# Patient Record
Sex: Female | Born: 1935
Health system: Southern US, Community
[De-identification: ages and names within clinical notes are randomized; demographics above are authoritative.]

## PROBLEM LIST (undated history)

## (undated) DIAGNOSIS — E7800 Pure hypercholesterolemia, unspecified: Secondary | ICD-10-CM

## (undated) DIAGNOSIS — I10 Essential (primary) hypertension: Secondary | ICD-10-CM

## (undated) DIAGNOSIS — M1990 Unspecified osteoarthritis, unspecified site: Secondary | ICD-10-CM

## (undated) DIAGNOSIS — H25019 Cortical age-related cataract, unspecified eye: Secondary | ICD-10-CM

## (undated) DIAGNOSIS — J449 Chronic obstructive pulmonary disease, unspecified: Secondary | ICD-10-CM

## (undated) DIAGNOSIS — M109 Gout, unspecified: Secondary | ICD-10-CM

## (undated) DIAGNOSIS — N289 Disorder of kidney and ureter, unspecified: Secondary | ICD-10-CM

## (undated) DIAGNOSIS — R600 Localized edema: Secondary | ICD-10-CM

## (undated) DIAGNOSIS — R609 Edema, unspecified: Secondary | ICD-10-CM

## (undated) DIAGNOSIS — I5030 Unspecified diastolic (congestive) heart failure: Secondary | ICD-10-CM

## (undated) DIAGNOSIS — N6325 Unspecified lump in the left breast, overlapping quadrants: Secondary | ICD-10-CM

## (undated) DIAGNOSIS — I509 Heart failure, unspecified: Secondary | ICD-10-CM

## (undated) DIAGNOSIS — N6099 Unspecified benign mammary dysplasia of unspecified breast: Secondary | ICD-10-CM

## (undated) DIAGNOSIS — K5790 Diverticulosis of intestine, part unspecified, without perforation or abscess without bleeding: Secondary | ICD-10-CM

## (undated) HISTORY — DX: Diverticulosis of intestine, part unspecified, without perforation or abscess without bleeding: K57.90

## (undated) HISTORY — DX: Gout, unspecified: M10.9

## (undated) HISTORY — DX: Unspecified diastolic (congestive) heart failure: I50.30

## (undated) HISTORY — PX: TONSILLECTOMY: SUR1361

## (undated) HISTORY — DX: Unspecified benign mammary dysplasia of unspecified breast: N60.99

## (undated) HISTORY — DX: Edema, unspecified: R60.9

## (undated) HISTORY — PX: OTHER SURGICAL HISTORY: SHX169

## (undated) HISTORY — PX: TOTAL KNEE ARTHROPLASTY: SHX125

## (undated) HISTORY — DX: Localized edema: R60.0

## (undated) HISTORY — DX: Unspecified lump in the left breast, overlapping quadrants: N63.25

## (undated) HISTORY — DX: Pure hypercholesterolemia, unspecified: E78.00

## (undated) HISTORY — PX: CHOLECYSTECTOMY: SHX55

## (undated) HISTORY — PX: ABDOMINAL HYSTERECTOMY: SHX81

## (undated) HISTORY — DX: Cortical age-related cataract, unspecified eye: H25.019

## (undated) HISTORY — DX: Unspecified osteoarthritis, unspecified site: M19.90

---

## 2003-12-25 ENCOUNTER — Ambulatory Visit: Payer: Self-pay | Admitting: Family Medicine

## 2003-12-28 ENCOUNTER — Ambulatory Visit: Payer: Self-pay | Admitting: Family Medicine

## 2004-07-03 ENCOUNTER — Ambulatory Visit: Payer: Self-pay | Admitting: Family Medicine

## 2011-06-18 ENCOUNTER — Ambulatory Visit: Payer: Self-pay | Admitting: Family Medicine

## 2013-01-27 HISTORY — PX: CATARACT EXTRACTION: SUR2

## 2013-02-01 ENCOUNTER — Ambulatory Visit: Payer: Self-pay | Admitting: Family Medicine

## 2013-04-29 ENCOUNTER — Inpatient Hospital Stay: Payer: Self-pay | Admitting: Internal Medicine

## 2013-04-29 LAB — COMPREHENSIVE METABOLIC PANEL
ALBUMIN: 3.9 g/dL (ref 3.4–5.0)
Alkaline Phosphatase: 122 U/L — ABNORMAL HIGH
Anion Gap: 5 — ABNORMAL LOW (ref 7–16)
BUN: 20 mg/dL — AB (ref 7–18)
Bilirubin,Total: 0.7 mg/dL (ref 0.2–1.0)
CALCIUM: 8.5 mg/dL (ref 8.5–10.1)
CO2: 31 mmol/L (ref 21–32)
Chloride: 105 mmol/L (ref 98–107)
Creatinine: 1.54 mg/dL — ABNORMAL HIGH (ref 0.60–1.30)
EGFR (African American): 36 — ABNORMAL LOW
EGFR (Non-African Amer.): 31 — ABNORMAL LOW
Glucose: 133 mg/dL — ABNORMAL HIGH (ref 65–99)
Osmolality: 286 (ref 275–301)
POTASSIUM: 3.2 mmol/L — AB (ref 3.5–5.1)
SGOT(AST): 24 U/L (ref 15–37)
SGPT (ALT): 16 U/L (ref 12–78)
Sodium: 141 mmol/L (ref 136–145)
TOTAL PROTEIN: 7 g/dL (ref 6.4–8.2)

## 2013-04-29 LAB — CBC WITH DIFFERENTIAL/PLATELET
BASOS ABS: 0.1 10*3/uL (ref 0.0–0.1)
Basophil %: 0.8 %
Eosinophil #: 0.1 10*3/uL (ref 0.0–0.7)
Eosinophil %: 1.4 %
HCT: 37.1 % (ref 35.0–47.0)
HGB: 12.4 g/dL (ref 12.0–16.0)
LYMPHS ABS: 1.3 10*3/uL (ref 1.0–3.6)
LYMPHS PCT: 15.2 %
MCH: 28.7 pg (ref 26.0–34.0)
MCHC: 33.3 g/dL (ref 32.0–36.0)
MCV: 86 fL (ref 80–100)
MONO ABS: 0.4 x10 3/mm (ref 0.2–0.9)
MONOS PCT: 5.2 %
Neutrophil #: 6.6 10*3/uL — ABNORMAL HIGH (ref 1.4–6.5)
Neutrophil %: 77.4 %
PLATELETS: 171 10*3/uL (ref 150–440)
RBC: 4.31 10*6/uL (ref 3.80–5.20)
RDW: 14 % (ref 11.5–14.5)
WBC: 8.5 10*3/uL (ref 3.6–11.0)

## 2013-04-29 LAB — URINALYSIS, COMPLETE
Bilirubin,UR: NEGATIVE
Glucose,UR: NEGATIVE mg/dL (ref 0–75)
Ketone: NEGATIVE
Leukocyte Esterase: NEGATIVE
NITRITE: NEGATIVE
Ph: 6 (ref 4.5–8.0)
Protein: 100
RBC,UR: 1 /HPF (ref 0–5)
Specific Gravity: 1.006 (ref 1.003–1.030)
Squamous Epithelial: 1
WBC UR: 1 /HPF (ref 0–5)

## 2013-04-29 LAB — TROPONIN I

## 2013-04-29 LAB — PRO B NATRIURETIC PEPTIDE: B-Type Natriuretic Peptide: 3749 pg/mL — ABNORMAL HIGH (ref 0–450)

## 2013-04-30 LAB — CBC WITH DIFFERENTIAL/PLATELET
BASOS ABS: 0.1 10*3/uL (ref 0.0–0.1)
Basophil %: 0.7 %
EOS ABS: 0.2 10*3/uL (ref 0.0–0.7)
Eosinophil %: 2.1 %
HCT: 32 % — AB (ref 35.0–47.0)
HGB: 11.1 g/dL — ABNORMAL LOW (ref 12.0–16.0)
LYMPHS ABS: 1.7 10*3/uL (ref 1.0–3.6)
LYMPHS PCT: 22.9 %
MCH: 29.8 pg (ref 26.0–34.0)
MCHC: 34.7 g/dL (ref 32.0–36.0)
MCV: 86 fL (ref 80–100)
MONO ABS: 0.5 x10 3/mm (ref 0.2–0.9)
Monocyte %: 7.2 %
Neutrophil #: 4.9 10*3/uL (ref 1.4–6.5)
Neutrophil %: 67.1 %
Platelet: 167 10*3/uL (ref 150–440)
RBC: 3.72 10*6/uL — ABNORMAL LOW (ref 3.80–5.20)
RDW: 13.7 % (ref 11.5–14.5)
WBC: 7.3 10*3/uL (ref 3.6–11.0)

## 2013-04-30 LAB — BASIC METABOLIC PANEL
ANION GAP: 7 (ref 7–16)
BUN: 19 mg/dL — AB (ref 7–18)
CHLORIDE: 104 mmol/L (ref 98–107)
CREATININE: 1.63 mg/dL — AB (ref 0.60–1.30)
Calcium, Total: 8.1 mg/dL — ABNORMAL LOW (ref 8.5–10.1)
Co2: 32 mmol/L (ref 21–32)
EGFR (African American): 33 — ABNORMAL LOW
EGFR (Non-African Amer.): 29 — ABNORMAL LOW
Glucose: 95 mg/dL (ref 65–99)
OSMOLALITY: 287 (ref 275–301)
Potassium: 2.9 mmol/L — ABNORMAL LOW (ref 3.5–5.1)
SODIUM: 143 mmol/L (ref 136–145)

## 2013-04-30 LAB — MAGNESIUM: MAGNESIUM: 1.4 mg/dL — AB

## 2013-05-01 LAB — BASIC METABOLIC PANEL
Anion Gap: 4 — ABNORMAL LOW (ref 7–16)
BUN: 25 mg/dL — AB (ref 7–18)
CALCIUM: 8 mg/dL — AB (ref 8.5–10.1)
CREATININE: 1.9 mg/dL — AB (ref 0.60–1.30)
Chloride: 101 mmol/L (ref 98–107)
Co2: 36 mmol/L — ABNORMAL HIGH (ref 21–32)
EGFR (African American): 28 — ABNORMAL LOW
GFR CALC NON AF AMER: 24 — AB
GLUCOSE: 105 mg/dL — AB (ref 65–99)
OSMOLALITY: 286 (ref 275–301)
POTASSIUM: 3.2 mmol/L — AB (ref 3.5–5.1)
SODIUM: 141 mmol/L (ref 136–145)

## 2013-05-01 LAB — MAGNESIUM: Magnesium: 1.6 mg/dL — ABNORMAL LOW

## 2013-05-02 LAB — BASIC METABOLIC PANEL
ANION GAP: 3 — AB (ref 7–16)
BUN: 35 mg/dL — ABNORMAL HIGH (ref 7–18)
CALCIUM: 7.8 mg/dL — AB (ref 8.5–10.1)
CO2: 36 mmol/L — AB (ref 21–32)
Chloride: 99 mmol/L (ref 98–107)
Creatinine: 2.54 mg/dL — ABNORMAL HIGH (ref 0.60–1.30)
EGFR (African American): 20 — ABNORMAL LOW
GFR CALC NON AF AMER: 17 — AB
Glucose: 101 mg/dL — ABNORMAL HIGH (ref 65–99)
OSMOLALITY: 284 (ref 275–301)
Potassium: 3.7 mmol/L (ref 3.5–5.1)
Sodium: 138 mmol/L (ref 136–145)

## 2013-05-02 LAB — MAGNESIUM: Magnesium: 2 mg/dL

## 2013-05-03 LAB — BASIC METABOLIC PANEL
Anion Gap: 4 — ABNORMAL LOW (ref 7–16)
BUN: 40 mg/dL — ABNORMAL HIGH (ref 7–18)
CALCIUM: 7.8 mg/dL — AB (ref 8.5–10.1)
Chloride: 100 mmol/L (ref 98–107)
Co2: 35 mmol/L — ABNORMAL HIGH (ref 21–32)
Creatinine: 2.39 mg/dL — ABNORMAL HIGH (ref 0.60–1.30)
EGFR (African American): 21 — ABNORMAL LOW
EGFR (Non-African Amer.): 18 — ABNORMAL LOW
Glucose: 98 mg/dL (ref 65–99)
Osmolality: 287 (ref 275–301)
Potassium: 3.8 mmol/L (ref 3.5–5.1)
Sodium: 139 mmol/L (ref 136–145)

## 2013-05-03 LAB — PROTEIN URINE, QUAL: Protein: 30

## 2013-05-03 LAB — CREATININE, URINE, RANDOM: Creatinine, Urine Random: 109.1 mg/dL (ref 30.0–125.0)

## 2013-05-04 LAB — UR PROT ELECTROPHORESIS, URINE RANDOM

## 2013-05-04 LAB — PROTEIN ELECTROPHORESIS(ARMC)

## 2013-05-04 LAB — PLATELET COUNT: Platelet: 147 10*3/uL — ABNORMAL LOW (ref 150–440)

## 2013-05-05 LAB — RENAL FUNCTION PANEL
ANION GAP: 0 — AB (ref 7–16)
Albumin: 3.2 g/dL — ABNORMAL LOW (ref 3.4–5.0)
BUN: 37 mg/dL — ABNORMAL HIGH (ref 7–18)
CALCIUM: 8.2 mg/dL — AB (ref 8.5–10.1)
CREATININE: 2.04 mg/dL — AB (ref 0.60–1.30)
Chloride: 104 mmol/L (ref 98–107)
Co2: 34 mmol/L — ABNORMAL HIGH (ref 21–32)
EGFR (African American): 25 — ABNORMAL LOW
EGFR (Non-African Amer.): 22 — ABNORMAL LOW
Glucose: 105 mg/dL — ABNORMAL HIGH (ref 65–99)
Osmolality: 285 (ref 275–301)
PHOSPHORUS: 3.3 mg/dL (ref 2.5–4.9)
Potassium: 4.1 mmol/L (ref 3.5–5.1)
Sodium: 138 mmol/L (ref 136–145)

## 2013-10-12 ENCOUNTER — Ambulatory Visit: Payer: Self-pay | Admitting: Ophthalmology

## 2013-11-02 ENCOUNTER — Ambulatory Visit: Payer: Self-pay | Admitting: Ophthalmology

## 2014-05-20 NOTE — H&P (Signed)
PATIENT NAME:  Gloria Day, Gloria Day MR#:  [id number] DATE OF BIRTH:  [birth year]  DATE OF ADMISSION:  04/29/2013  PRIMARY CARE PHYSICIAN: Dr. Domenick Gong.   CHIEF COMPLAINT: Lower extremity edema and shortness of breath.   HISTORY OF PRESENT ILLNESS: This is an 81 year old female who presents to the hospital  with worsening lower extremity edema and shortness of breath. The patient says her symptoms have been ongoing for months. She has significant 2 to 3 pillow orthopnea and paroxysmal nocturnal dyspnea. She has also had significant lower extremity swelling and also has some blisters forming in her right lower extremity. She has significant shortness of breath on minimal exertion now in the past few days, even just going to the bathroom. She has a home health nurse following her, who noticed that her symptoms are getting progressively worse and sent her over to the ER for further evaluation. In the Emergency Room, the patient was noted to have chest x-ray findings suggestive of congestive heart failure. She clinically looks like she has CHF, and therefore hospitalist services were contacted for further treatment and evaluation.   REVIEW OF SYSTEMS:  CONSTITUTIONAL:  No documented fever. Positive weight gain, but unknown how much. No weight loss.  EYES: No blurred or double vision.  ENT: No tinnitus. No postnasal drip. No redness of the oropharynx.  RESPIRATORY: No cough. No wheeze. No hemoptysis. Positive dyspnea.  CARDIOVASCULAR: No chest pain. Positive 2 pillow orthopnea. No palpitations. No syncope.  GASTROINTESTINAL: No nausea. No vomiting or diarrhea. No abdominal pain. No melena or hematochezia.  GENITOURINARY: No dysuria or hematuria.  ENDOCRINE: No polyuria or nocturia. No heat or cold intolerance. HEMATOLOGIC:  No anemia. No bruising. No bleeding.  INTEGUMENTARY: No rashes. No lesions.  MUSCULOSKELETAL: No arthritis. No swelling. No gout.  NEUROLOGIC: No numbness or tingling. No ataxia. No  seizure-type activity.  PSYCHIATRIC: No anxiety. No insomnia. No ADD.   PAST MEDICAL HISTORY: Consistent with hypertension, hyperlipidemia, GERD, arthritis.   SOCIAL HISTORY: No smoking. No alcohol abuse. No illicit drug abuse. Lives at home with her daughter.   FAMILY HISTORY: Both mother and father are deceased; both died from complications of a heart attack.   CURRENT MEDICATIONS: Aleve 1 tab at bedtime as needed, lisinopril 20 mg b.i.d., omeprazole 40 mg daily, simvastatin 40 mg at bedtime, Cardizem CD 180 mg daily, Lasix 20 mg daily as needed, although she has not taken the Lasix in months.   PHYSICAL EXAMINATION: Presently is as follows:  VITAL SIGNS:  Are noted to be, temperature is 98.2, pulse 77, respirations 24, blood pressure 228/105, sats 98% on room air.  GENERAL: She is a pleasant-appearing female. No apparent distress.  HEAD, EYES, EARS, NOSE, THROAT:  Atraumatic, normocephalic. Extraocular muscles are intact. Pupils equal and reactive to light. Sclerae anicteric. No conjunctival injection. No pharyngeal erythema.  NECK: Supple. There is no positive jugular venous distention. No bruits. No lymphadenopathy.  HEART: Regular rate and rhythm. No murmurs. No rubs. No clicks.  LUNGS: She has some bibasilar crackles. Positive use of accessory muscles. No dullness to percussion.  ABDOMEN: Soft, flat, nontender, nondistended. Has good bowel sounds. No hepatosplenomegaly appreciated.  EXTREMITIES: No evidence of cyanosis, clubbing. Does have +2 to 3 pitting edema bilaterally on the lower extremities. She also has a fluid-filled blister on her right lower extremity.  SKIN: Moist and warm with no rashes.  LYMPHATIC: There is no cervical or axillary lymphadenopathy.  NEUROLOGIC: The patient is alert, awake and oriented x 3 with  no focal motor or sensory deficits appreciated bilaterally.   LABORATORY DATA: Shows serum glucose of 133, BUN 20, creatinine 1.5, sodium 141, potassium 3.2. BNP is  3749. Chloride 105, bicarb 31. The patient's LFTs are within normal limits. Troponin less than 0.02. White cell count 8.5, hemoglobin 12.4, hematocrit 37.1, platelet count 171. Urinalysis within normal limits.   The patient did have a chest x-ray done which showed mild degree of congestive heart failure. An EKG done which showed normal sinus rhythm with left access deviation and right bundle branch block.   ASSESSMENT AND PLAN: This is an 81 year old female with a history of hypertension, hyperlipidemia, gastroesophageal reflux disease, arthritis, presents to the hospital due to lower extremity edema and worsening shortness of breath and noted to be in congestive heart failure.  1.  Congestive heart failure. This is likely acute CHF and the cause of the patient's shortness of breath and lower extremity edema. The patient's symptoms have been persistent for months, but have progressively gotten worse. I am unclear whether this is systolic or diastolic. I will await an echocardiogram. I will diurese her with IV Lasix, follow I's and O's and daily weights. Continue her Cardizem, add some low-dose metoprolol, continue her ACE inhibitor. I will get a cardiology consult.  2. Malignant hypertension. I will continue her lisinopril and Cardizem, add some p.r.n. hydralazine and some oral metoprolol.  3. Acute renal failure. I do not have a previous creatinine to compare with. This is probably cardiorenal renal failure related to her CHF. I will diurese her with IV Lasix and follow BUN and creatinine and urine output.  4.  Hyperlipidemia. Continue simvastatin.  5.  Gastroesophageal reflux disease. Continue omeprazole.   The patient is a FULL CODE.   Time spent is 50 minutes.     ____________________________ Belia Heman. Verdell Carmine, MD vjs:dmm D: 04/29/2013 19:30:06 ET T: 04/29/2013 20:00:33 ET JOB#: [id number]  cc: Belia Heman. Verdell Carmine, MD, <Dictator> Henreitta Leber MD ELECTRONICALLY SIGNED 05/08/2013 18:49

## 2014-05-20 NOTE — Consult Note (Signed)
PATIENT NAME:  Gloria Day, Gloria Day MR#:  [id number] DATE OF BIRTH:  23-May-1928  CARDIOLOGY CONSULTATION   DATE OF CONSULTATION:  04/30/2013  REFERRING PHYSICIAN:  Vivek J. Verdell Carmine, MD CONSULTING PHYSICIAN:  Isaias Cowman, MD  PRIMARY CARE PHYSICIAN: Fonnie Jarvis. Ilene Qua, MD  CHIEF COMPLAINT: "My blood pressure was up."   REASON FOR CONSULTATION: Consultation requested for evaluation of hypertensive emergency, congestive heart failure.   HISTORY OF PRESENT ILLNESS: The patient is an 81 year old female with history of hypertension. The patient reports that she has been in her usual state of health, which includes exertional dyspnea, peripheral edema. She reports recently she has had worsening peripheral edema associated with orthopnea and paroxysmal nocturnal dyspnea. The patient presented to [facility] Emergency Room with markedly elevated blood. Initial blood pressure in the Emergency Room was 228/105. Upon questioning, the patient reports that she is not compliant with a low sodium diet. She denies chest pain. Chest x-ray did reveal findings consistent with pulmonary edema. Admission labs were notable for a negative troponin, and BNP was moderately elevated to 3749. EKG revealed sinus rhythm with right bundle branch block.   PAST MEDICAL HISTORY:  1. Hypertension.  2. Hyperlipidemia.  3. Gastroesophageal reflux disease.  4. Chronic peripheral edema.   MEDICATIONS ON ADMISSION:  1. Furosemide 20 mg daily p.r.n. 2. Cardizem CD 180 mg daily.  3. Lisinopril 20 mg b.i.d.  4. Simvastatin 40 mg at bedtime.  5. Aleve 1 at bedtime.  6. Omeprazole 40 mg daily.   SOCIAL HISTORY: The patient currently lives with her daughter. She denies tobacco abuse.   FAMILY HISTORY: Father, mother and brother all with history of coronary artery disease.   REVIEW OF SYSTEMS:  CONSTITUTIONAL: No fever or chills.  EYES: No blurry vision.  EARS: No hearing loss.  RESPIRATORY: Shortness of breath, as described above.   CARDIOVASCULAR: No chest pain.  GASTROINTESTINAL: The patient has had some mild diarrhea.  GENITOURINARY: No dysuria or hematuria.  ENDOCRINE: No polyuria or polydipsia.  MUSCULOSKELETAL: No arthralgias or myalgias.  NEUROLOGICAL: No focal muscle weakness or numbness.  PSYCHOLOGICAL: No depression or anxiety.   PHYSICAL EXAMINATION:  VITAL SIGNS: Blood pressure 162/71, pulse 71, respirations 18, temperature 98.2, pulse oximetry 96%.  HEENT: Pupils equally reactive to light and accommodation.  NECK: Supple without thyromegaly.  LUNGS: Clear.  CARDIOVASCULAR: Normal JVP. Normal PMI. Regular rate and rhythm. Normal S1 and S2. No appreciable gallop, murmur or rub.  ABDOMEN: Soft and nontender.  PULSES: Intact bilaterally.  MUSCULOSKELETAL: Normal muscle tone.  NEUROLOGIC: The patient is alert and oriented x3. Motor and sensory both grossly intact.   IMPRESSION: An 81 year old female who presents with hypertensive emergency, with associated pulmonary edema, consistent with diastolic congestive heart failure. The patient has ruled out for myocardial infarction by CPK, isoenzymes and troponin. The patient has baseline chronic kidney disease likely secondary to hypertension.   RECOMMENDATIONS:  1. Agree with overall current therapy.  2. Continue diuresis.  3. Agree with adding hydralazine and topical nitrates.  4. Would be hesitant to start ACE inhibitor therapy in the setting of probable acute on chronic renal failure.  5. Review 2-D echocardiogram.  6. Further recommendations pending echocardiogram results.   ____________________________ Isaias Cowman, MD ap:lb D: 04/30/2013 09:38:23 ET T: 04/30/2013 10:46:17 ET JOB#: [id number]  cc: Isaias Cowman, MD, <Dictator> Isaias Cowman MD ELECTRONICALLY SIGNED 04/30/2013 13:22

## 2014-05-20 NOTE — Discharge Summary (Signed)
PATIENT NAME:  Gloria Day, Gloria Day MR#:  [id number] DATE OF BIRTH:  [birth year]  DATE OF ADMISSION:  04/29/2013 DATE OF DISCHARGE:  05/06/2013  PRIMARY PHYSICIAN: Fonnie Jarvis. Ilene Qua, MD  DISCHARGE DIAGNOSES: 1. Acute on chronic diastolic heart failure exacerbation. 2. Malignant hypertension. 3. Acute renal failure. 4. Acute bronchitis.   DISCHARGE MEDICATIONS:  1. Omeprazole 40 mg p.o. daily. 2. Simvastatin 40 mg p.o. daily. 4. Diltiazem 180 mg p.o. daily. 5. Metoprolol 25 mg p.o. b.i.d.   6. Magnesium oxide 400 mg p.o. daily.  7. Azithromycin 250 mg p.o. daily.  8. Hydralazine 25 mg p.o. 4 times daily.  9. Prednisone 20 mg 2 tablets daily for 2 days, 1 tablet daily for 3 days and then stop.  10. ProAir 2 puffs 4 times daily.  Advised to stop lisinopril and Aleve. The patient will follow up with the nephrologist to restart the lisinopril and Lasix.   CONSULTATIONS: Nephrology consult, Dr. Glean Salen and Dr. Candiss Norse. Cardiology consult with Dr. Saralyn Pilar.   HOSPITAL COURSE: An 81 year old female patient with hypertension, brought in because of elevated blood pressures. The patient has a home nurse visiting her for her leg wound. They found that blood pressure was 228/105, so she was admitted for malignant hypertension and CHF exacerbation. The patient's BNP was 3749 on admission with x-ray showing pulmonary edema. The patient had some orthopnea and pedal edema on admission, and she was admitted for CHF exacerbation, started on IV Lasix along with daily weights, and we continued her ACE inhibitors. Metoprolol. The patient was seen by cardiology, Dr. Saralyn Pilar. The patient's leg swelling nicely improved with the Lasix; however, kidney function got worsened, so we had to hold the Lasix. The patient's echocardiogram showed EF more than 65% with normal systolic function and impaired relaxation pattern with diastolic filling defect. The patient's white count was normal. Troponins were negative. The  patient's CBC was normal on admission. UA was clear, and the patient's creatinine was 1.63 and BUN was 19 on admission. The patient's creatinine jumped up to 2.54 on April 6th with BUN of 35. The patient was seen by nephrology, and they ordered anti-GBM antibodies, ANCA panel, antinuclear antibodies, protein electrophoresis and also ultrasound of the kidneys. The patient's anti-GBM antibodies  are negative.Marland Kitchen Ultrasound of kidneys did not show any obstruction, and the ANCA panel is also negative. The patient's ANA was negative, and protein electrophoresis also is negative. The patient's creatinine improved after stopping the diuretics too, and the patient's creatinine today is 2 and BUN 37. The patient's malignant hypertension improved nicely. Dr. Candiss Norse saw the patient today regarding her kidney function. The patient will follow up with them as an outpatient to start the diuretics. Her blood pressure is stabilized. We started hydralazine for her BP control. The patient started to have some cough and wheezing, and I started her on Zithromax and steroids and some inhalers that she can take at home. The patient did not have any hypoxia, swelling improved, . Discharged her home in stable condition. Can follow up with her primary doctor and Dr. Candiss Norse in 1 week.   TIME SPENT ON DISCHARGE PREPARATION: More than 30 minutes.  PHYSICAL EXAMINATION TODAY:  GENERAL: The patient is alert, awake and oriented.  CARDIOVASCULAR: S1, S2 regular.  LUNGS: The patient has mild bilateral expiratory wheezing in both lungs.  GASTROINTESTINAL: Abdomen is soft, nontender, nondistended. Bowel sounds are present. EXTREMITIES: No extremity edema. No cyanosis. No clubbing.   ____________________________ Epifanio Lesches, MD sk:lb D: 05/06/2013 23:12:05 ET  T: 05/07/2013 06:16:35 ET JOB#: [id number]  cc: Epifanio Lesches, MD, <Dictator> Epifanio Lesches MD ELECTRONICALLY SIGNED 05/16/2013 22:44

## 2015-02-12 DIAGNOSIS — R809 Proteinuria, unspecified: Secondary | ICD-10-CM | POA: Diagnosis not present

## 2015-02-12 DIAGNOSIS — N184 Chronic kidney disease, stage 4 (severe): Secondary | ICD-10-CM | POA: Diagnosis not present

## 2015-02-12 DIAGNOSIS — N2581 Secondary hyperparathyroidism of renal origin: Secondary | ICD-10-CM | POA: Diagnosis not present

## 2015-02-12 DIAGNOSIS — D631 Anemia in chronic kidney disease: Secondary | ICD-10-CM | POA: Diagnosis not present

## 2015-02-12 DIAGNOSIS — I5032 Chronic diastolic (congestive) heart failure: Secondary | ICD-10-CM | POA: Diagnosis not present

## 2015-02-12 DIAGNOSIS — R609 Edema, unspecified: Secondary | ICD-10-CM | POA: Diagnosis not present

## 2015-02-12 DIAGNOSIS — E7800 Pure hypercholesterolemia, unspecified: Secondary | ICD-10-CM | POA: Diagnosis not present

## 2015-02-12 DIAGNOSIS — I10 Essential (primary) hypertension: Secondary | ICD-10-CM | POA: Diagnosis not present

## 2015-02-12 DIAGNOSIS — I129 Hypertensive chronic kidney disease with stage 1 through stage 4 chronic kidney disease, or unspecified chronic kidney disease: Secondary | ICD-10-CM | POA: Diagnosis not present

## 2015-04-24 DIAGNOSIS — H353132 Nonexudative age-related macular degeneration, bilateral, intermediate dry stage: Secondary | ICD-10-CM | POA: Diagnosis not present

## 2015-05-09 DIAGNOSIS — R809 Proteinuria, unspecified: Secondary | ICD-10-CM | POA: Diagnosis not present

## 2015-05-09 DIAGNOSIS — M109 Gout, unspecified: Secondary | ICD-10-CM | POA: Diagnosis not present

## 2015-05-09 DIAGNOSIS — E875 Hyperkalemia: Secondary | ICD-10-CM | POA: Diagnosis not present

## 2015-05-09 DIAGNOSIS — N2581 Secondary hyperparathyroidism of renal origin: Secondary | ICD-10-CM | POA: Diagnosis not present

## 2015-05-09 DIAGNOSIS — I129 Hypertensive chronic kidney disease with stage 1 through stage 4 chronic kidney disease, or unspecified chronic kidney disease: Secondary | ICD-10-CM | POA: Diagnosis not present

## 2015-05-09 DIAGNOSIS — D631 Anemia in chronic kidney disease: Secondary | ICD-10-CM | POA: Diagnosis not present

## 2015-05-09 DIAGNOSIS — N184 Chronic kidney disease, stage 4 (severe): Secondary | ICD-10-CM | POA: Diagnosis not present

## 2015-05-28 DIAGNOSIS — M150 Primary generalized (osteo)arthritis: Secondary | ICD-10-CM | POA: Diagnosis not present

## 2015-05-28 DIAGNOSIS — I5032 Chronic diastolic (congestive) heart failure: Secondary | ICD-10-CM | POA: Diagnosis not present

## 2015-05-28 DIAGNOSIS — M1A3710 Chronic gout due to renal impairment, right ankle and foot, without tophus (tophi): Secondary | ICD-10-CM | POA: Diagnosis not present

## 2015-05-28 DIAGNOSIS — N184 Chronic kidney disease, stage 4 (severe): Secondary | ICD-10-CM | POA: Diagnosis not present

## 2015-05-28 DIAGNOSIS — K219 Gastro-esophageal reflux disease without esophagitis: Secondary | ICD-10-CM | POA: Diagnosis not present

## 2015-05-28 DIAGNOSIS — K921 Melena: Secondary | ICD-10-CM | POA: Diagnosis not present

## 2015-05-28 DIAGNOSIS — J449 Chronic obstructive pulmonary disease, unspecified: Secondary | ICD-10-CM | POA: Diagnosis not present

## 2015-05-28 DIAGNOSIS — I10 Essential (primary) hypertension: Secondary | ICD-10-CM | POA: Diagnosis not present

## 2015-05-28 DIAGNOSIS — E7800 Pure hypercholesterolemia, unspecified: Secondary | ICD-10-CM | POA: Diagnosis not present

## 2015-06-01 DIAGNOSIS — J449 Chronic obstructive pulmonary disease, unspecified: Secondary | ICD-10-CM | POA: Diagnosis not present

## 2015-06-01 DIAGNOSIS — I10 Essential (primary) hypertension: Secondary | ICD-10-CM | POA: Diagnosis not present

## 2015-06-01 DIAGNOSIS — K921 Melena: Secondary | ICD-10-CM | POA: Diagnosis not present

## 2015-06-01 DIAGNOSIS — M1A3710 Chronic gout due to renal impairment, right ankle and foot, without tophus (tophi): Secondary | ICD-10-CM | POA: Diagnosis not present

## 2015-06-01 DIAGNOSIS — M150 Primary generalized (osteo)arthritis: Secondary | ICD-10-CM | POA: Diagnosis not present

## 2015-06-01 DIAGNOSIS — I5032 Chronic diastolic (congestive) heart failure: Secondary | ICD-10-CM | POA: Diagnosis not present

## 2015-06-01 DIAGNOSIS — E7800 Pure hypercholesterolemia, unspecified: Secondary | ICD-10-CM | POA: Diagnosis not present

## 2015-06-01 DIAGNOSIS — N184 Chronic kidney disease, stage 4 (severe): Secondary | ICD-10-CM | POA: Diagnosis not present

## 2015-06-01 DIAGNOSIS — K219 Gastro-esophageal reflux disease without esophagitis: Secondary | ICD-10-CM | POA: Diagnosis not present

## 2015-06-07 DIAGNOSIS — J441 Chronic obstructive pulmonary disease with (acute) exacerbation: Secondary | ICD-10-CM | POA: Diagnosis not present

## 2015-06-07 DIAGNOSIS — R05 Cough: Secondary | ICD-10-CM | POA: Diagnosis not present

## 2015-06-12 DIAGNOSIS — R1031 Right lower quadrant pain: Secondary | ICD-10-CM | POA: Diagnosis not present

## 2015-06-12 DIAGNOSIS — R1032 Left lower quadrant pain: Secondary | ICD-10-CM | POA: Diagnosis not present

## 2015-06-12 DIAGNOSIS — D631 Anemia in chronic kidney disease: Secondary | ICD-10-CM | POA: Diagnosis not present

## 2015-06-12 DIAGNOSIS — R195 Other fecal abnormalities: Secondary | ICD-10-CM | POA: Diagnosis not present

## 2015-06-12 DIAGNOSIS — R1310 Dysphagia, unspecified: Secondary | ICD-10-CM | POA: Diagnosis not present

## 2015-06-12 DIAGNOSIS — K5790 Diverticulosis of intestine, part unspecified, without perforation or abscess without bleeding: Secondary | ICD-10-CM | POA: Diagnosis not present

## 2015-06-13 ENCOUNTER — Other Ambulatory Visit: Payer: Self-pay | Admitting: Nurse Practitioner

## 2015-06-13 DIAGNOSIS — R195 Other fecal abnormalities: Secondary | ICD-10-CM

## 2015-06-13 DIAGNOSIS — D631 Anemia in chronic kidney disease: Secondary | ICD-10-CM

## 2015-06-13 DIAGNOSIS — N189 Chronic kidney disease, unspecified: Secondary | ICD-10-CM

## 2015-06-13 DIAGNOSIS — K5790 Diverticulosis of intestine, part unspecified, without perforation or abscess without bleeding: Secondary | ICD-10-CM

## 2015-06-13 DIAGNOSIS — R1032 Left lower quadrant pain: Principal | ICD-10-CM

## 2015-06-13 DIAGNOSIS — R1031 Right lower quadrant pain: Secondary | ICD-10-CM

## 2015-06-13 DIAGNOSIS — R1310 Dysphagia, unspecified: Secondary | ICD-10-CM

## 2015-06-19 ENCOUNTER — Ambulatory Visit: Admission: RE | Admit: 2015-06-19 | Payer: PPO | Source: Ambulatory Visit

## 2015-07-05 ENCOUNTER — Ambulatory Visit: Payer: Self-pay | Attending: Nurse Practitioner

## 2015-07-09 ENCOUNTER — Ambulatory Visit: Payer: Self-pay

## 2015-08-16 DIAGNOSIS — E875 Hyperkalemia: Secondary | ICD-10-CM | POA: Diagnosis not present

## 2015-08-16 DIAGNOSIS — D631 Anemia in chronic kidney disease: Secondary | ICD-10-CM | POA: Diagnosis not present

## 2015-08-16 DIAGNOSIS — N184 Chronic kidney disease, stage 4 (severe): Secondary | ICD-10-CM | POA: Diagnosis not present

## 2015-08-16 DIAGNOSIS — N2581 Secondary hyperparathyroidism of renal origin: Secondary | ICD-10-CM | POA: Diagnosis not present

## 2015-08-16 DIAGNOSIS — R809 Proteinuria, unspecified: Secondary | ICD-10-CM | POA: Diagnosis not present

## 2015-10-23 DIAGNOSIS — H353132 Nonexudative age-related macular degeneration, bilateral, intermediate dry stage: Secondary | ICD-10-CM | POA: Diagnosis not present

## 2015-11-26 DIAGNOSIS — R809 Proteinuria, unspecified: Secondary | ICD-10-CM | POA: Diagnosis not present

## 2015-11-26 DIAGNOSIS — E875 Hyperkalemia: Secondary | ICD-10-CM | POA: Diagnosis not present

## 2015-11-26 DIAGNOSIS — N2581 Secondary hyperparathyroidism of renal origin: Secondary | ICD-10-CM | POA: Diagnosis not present

## 2015-11-26 DIAGNOSIS — N184 Chronic kidney disease, stage 4 (severe): Secondary | ICD-10-CM | POA: Diagnosis not present

## 2015-11-26 DIAGNOSIS — I129 Hypertensive chronic kidney disease with stage 1 through stage 4 chronic kidney disease, or unspecified chronic kidney disease: Secondary | ICD-10-CM | POA: Diagnosis not present

## 2015-11-28 DIAGNOSIS — K219 Gastro-esophageal reflux disease without esophagitis: Secondary | ICD-10-CM | POA: Diagnosis not present

## 2015-11-28 DIAGNOSIS — M150 Primary generalized (osteo)arthritis: Secondary | ICD-10-CM | POA: Diagnosis not present

## 2015-11-28 DIAGNOSIS — Z23 Encounter for immunization: Secondary | ICD-10-CM | POA: Diagnosis not present

## 2015-11-28 DIAGNOSIS — M1A3710 Chronic gout due to renal impairment, right ankle and foot, without tophus (tophi): Secondary | ICD-10-CM | POA: Diagnosis not present

## 2015-11-28 DIAGNOSIS — I5032 Chronic diastolic (congestive) heart failure: Secondary | ICD-10-CM | POA: Diagnosis not present

## 2015-11-28 DIAGNOSIS — I10 Essential (primary) hypertension: Secondary | ICD-10-CM | POA: Diagnosis not present

## 2015-11-28 DIAGNOSIS — E7800 Pure hypercholesterolemia, unspecified: Secondary | ICD-10-CM | POA: Diagnosis not present

## 2015-11-28 DIAGNOSIS — J449 Chronic obstructive pulmonary disease, unspecified: Secondary | ICD-10-CM | POA: Diagnosis not present

## 2015-11-28 DIAGNOSIS — N184 Chronic kidney disease, stage 4 (severe): Secondary | ICD-10-CM | POA: Diagnosis not present

## 2015-12-12 DIAGNOSIS — D649 Anemia, unspecified: Secondary | ICD-10-CM | POA: Diagnosis not present

## 2015-12-19 DIAGNOSIS — D649 Anemia, unspecified: Secondary | ICD-10-CM | POA: Diagnosis not present

## 2016-01-24 DIAGNOSIS — D649 Anemia, unspecified: Secondary | ICD-10-CM | POA: Diagnosis not present

## 2016-02-25 DIAGNOSIS — N184 Chronic kidney disease, stage 4 (severe): Secondary | ICD-10-CM | POA: Diagnosis not present

## 2016-02-25 DIAGNOSIS — N2581 Secondary hyperparathyroidism of renal origin: Secondary | ICD-10-CM | POA: Diagnosis not present

## 2016-02-25 DIAGNOSIS — D631 Anemia in chronic kidney disease: Secondary | ICD-10-CM | POA: Diagnosis not present

## 2016-02-25 DIAGNOSIS — I129 Hypertensive chronic kidney disease with stage 1 through stage 4 chronic kidney disease, or unspecified chronic kidney disease: Secondary | ICD-10-CM | POA: Diagnosis not present

## 2016-05-23 DIAGNOSIS — N2581 Secondary hyperparathyroidism of renal origin: Secondary | ICD-10-CM | POA: Diagnosis not present

## 2016-05-23 DIAGNOSIS — M109 Gout, unspecified: Secondary | ICD-10-CM | POA: Diagnosis not present

## 2016-05-23 DIAGNOSIS — I129 Hypertensive chronic kidney disease with stage 1 through stage 4 chronic kidney disease, or unspecified chronic kidney disease: Secondary | ICD-10-CM | POA: Diagnosis not present

## 2016-05-23 DIAGNOSIS — N184 Chronic kidney disease, stage 4 (severe): Secondary | ICD-10-CM | POA: Diagnosis not present

## 2016-05-27 DIAGNOSIS — M1A3710 Chronic gout due to renal impairment, right ankle and foot, without tophus (tophi): Secondary | ICD-10-CM | POA: Diagnosis not present

## 2016-05-27 DIAGNOSIS — K219 Gastro-esophageal reflux disease without esophagitis: Secondary | ICD-10-CM | POA: Diagnosis not present

## 2016-05-27 DIAGNOSIS — J449 Chronic obstructive pulmonary disease, unspecified: Secondary | ICD-10-CM | POA: Diagnosis not present

## 2016-05-27 DIAGNOSIS — I5032 Chronic diastolic (congestive) heart failure: Secondary | ICD-10-CM | POA: Diagnosis not present

## 2016-05-27 DIAGNOSIS — N184 Chronic kidney disease, stage 4 (severe): Secondary | ICD-10-CM | POA: Diagnosis not present

## 2016-05-27 DIAGNOSIS — D649 Anemia, unspecified: Secondary | ICD-10-CM | POA: Diagnosis not present

## 2016-05-27 DIAGNOSIS — M150 Primary generalized (osteo)arthritis: Secondary | ICD-10-CM | POA: Diagnosis not present

## 2016-05-27 DIAGNOSIS — I10 Essential (primary) hypertension: Secondary | ICD-10-CM | POA: Diagnosis not present

## 2016-05-27 DIAGNOSIS — E7800 Pure hypercholesterolemia, unspecified: Secondary | ICD-10-CM | POA: Diagnosis not present

## 2016-09-02 DIAGNOSIS — H353130 Nonexudative age-related macular degeneration, bilateral, stage unspecified: Secondary | ICD-10-CM | POA: Diagnosis not present

## 2016-09-30 ENCOUNTER — Encounter (INDEPENDENT_AMBULATORY_CARE_PROVIDER_SITE_OTHER): Payer: PPO | Admitting: Ophthalmology

## 2016-09-30 DIAGNOSIS — H353134 Nonexudative age-related macular degeneration, bilateral, advanced atrophic with subfoveal involvement: Secondary | ICD-10-CM

## 2016-09-30 DIAGNOSIS — H43813 Vitreous degeneration, bilateral: Secondary | ICD-10-CM

## 2016-09-30 DIAGNOSIS — I10 Essential (primary) hypertension: Secondary | ICD-10-CM

## 2016-09-30 DIAGNOSIS — H35033 Hypertensive retinopathy, bilateral: Secondary | ICD-10-CM | POA: Diagnosis not present

## 2016-11-25 DIAGNOSIS — R601 Generalized edema: Secondary | ICD-10-CM | POA: Diagnosis not present

## 2016-11-25 DIAGNOSIS — I129 Hypertensive chronic kidney disease with stage 1 through stage 4 chronic kidney disease, or unspecified chronic kidney disease: Secondary | ICD-10-CM | POA: Diagnosis not present

## 2016-11-25 DIAGNOSIS — N184 Chronic kidney disease, stage 4 (severe): Secondary | ICD-10-CM | POA: Diagnosis not present

## 2016-11-25 DIAGNOSIS — M109 Gout, unspecified: Secondary | ICD-10-CM | POA: Diagnosis not present

## 2016-11-25 DIAGNOSIS — N2581 Secondary hyperparathyroidism of renal origin: Secondary | ICD-10-CM | POA: Diagnosis not present

## 2016-11-28 DIAGNOSIS — J449 Chronic obstructive pulmonary disease, unspecified: Secondary | ICD-10-CM | POA: Diagnosis not present

## 2016-11-28 DIAGNOSIS — K219 Gastro-esophageal reflux disease without esophagitis: Secondary | ICD-10-CM | POA: Diagnosis not present

## 2016-11-28 DIAGNOSIS — Z23 Encounter for immunization: Secondary | ICD-10-CM | POA: Diagnosis not present

## 2016-11-28 DIAGNOSIS — M150 Primary generalized (osteo)arthritis: Secondary | ICD-10-CM | POA: Diagnosis not present

## 2016-11-28 DIAGNOSIS — M1A3710 Chronic gout due to renal impairment, right ankle and foot, without tophus (tophi): Secondary | ICD-10-CM | POA: Diagnosis not present

## 2016-11-28 DIAGNOSIS — E7800 Pure hypercholesterolemia, unspecified: Secondary | ICD-10-CM | POA: Diagnosis not present

## 2016-11-28 DIAGNOSIS — I5032 Chronic diastolic (congestive) heart failure: Secondary | ICD-10-CM | POA: Diagnosis not present

## 2016-11-28 DIAGNOSIS — D649 Anemia, unspecified: Secondary | ICD-10-CM | POA: Diagnosis not present

## 2016-11-28 DIAGNOSIS — I10 Essential (primary) hypertension: Secondary | ICD-10-CM | POA: Diagnosis not present

## 2016-11-28 DIAGNOSIS — N184 Chronic kidney disease, stage 4 (severe): Secondary | ICD-10-CM | POA: Diagnosis not present

## 2016-12-03 DIAGNOSIS — H3533 Angioid streaks of macula: Secondary | ICD-10-CM | POA: Diagnosis not present

## 2016-12-24 ENCOUNTER — Other Ambulatory Visit: Payer: Self-pay | Admitting: Pharmacy Technician

## 2016-12-24 NOTE — Patient Outreach (Signed)
[facility]) Care Management  12/24/2016  Gloria Day 04-10-1928 [id number]  Incoming [employer] Emmi call in reference to medication adherence. HIPAA identifiers verified and verbal consent received. The medication in question is Simvastatin 40 mg and the patient states she takes it along with her other medication's daily as prescribed. She utilizes a pillbox and she does not think that she misses any doses.   Doreene Burke, [place] [phone]

## 2017-05-28 DIAGNOSIS — I5032 Chronic diastolic (congestive) heart failure: Secondary | ICD-10-CM | POA: Diagnosis not present

## 2017-05-28 DIAGNOSIS — M150 Primary generalized (osteo)arthritis: Secondary | ICD-10-CM | POA: Diagnosis not present

## 2017-05-28 DIAGNOSIS — I10 Essential (primary) hypertension: Secondary | ICD-10-CM | POA: Diagnosis not present

## 2017-05-28 DIAGNOSIS — J449 Chronic obstructive pulmonary disease, unspecified: Secondary | ICD-10-CM | POA: Diagnosis not present

## 2017-05-28 DIAGNOSIS — E7800 Pure hypercholesterolemia, unspecified: Secondary | ICD-10-CM | POA: Diagnosis not present

## 2017-05-28 DIAGNOSIS — D649 Anemia, unspecified: Secondary | ICD-10-CM | POA: Diagnosis not present

## 2017-05-28 DIAGNOSIS — M1A3710 Chronic gout due to renal impairment, right ankle and foot, without tophus (tophi): Secondary | ICD-10-CM | POA: Diagnosis not present

## 2017-05-28 DIAGNOSIS — N184 Chronic kidney disease, stage 4 (severe): Secondary | ICD-10-CM | POA: Diagnosis not present

## 2017-05-28 DIAGNOSIS — K219 Gastro-esophageal reflux disease without esophagitis: Secondary | ICD-10-CM | POA: Diagnosis not present

## 2017-06-01 DIAGNOSIS — M109 Gout, unspecified: Secondary | ICD-10-CM | POA: Diagnosis not present

## 2017-06-01 DIAGNOSIS — R809 Proteinuria, unspecified: Secondary | ICD-10-CM | POA: Diagnosis not present

## 2017-06-01 DIAGNOSIS — N2581 Secondary hyperparathyroidism of renal origin: Secondary | ICD-10-CM | POA: Diagnosis not present

## 2017-06-01 DIAGNOSIS — N184 Chronic kidney disease, stage 4 (severe): Secondary | ICD-10-CM | POA: Diagnosis not present

## 2017-06-01 DIAGNOSIS — I129 Hypertensive chronic kidney disease with stage 1 through stage 4 chronic kidney disease, or unspecified chronic kidney disease: Secondary | ICD-10-CM | POA: Diagnosis not present

## 2017-06-02 DIAGNOSIS — H40003 Preglaucoma, unspecified, bilateral: Secondary | ICD-10-CM | POA: Diagnosis not present

## 2017-06-02 DIAGNOSIS — H3533 Angioid streaks of macula: Secondary | ICD-10-CM | POA: Diagnosis not present

## 2017-06-02 DIAGNOSIS — H353130 Nonexudative age-related macular degeneration, bilateral, stage unspecified: Secondary | ICD-10-CM | POA: Diagnosis not present

## 2017-07-01 DIAGNOSIS — N184 Chronic kidney disease, stage 4 (severe): Secondary | ICD-10-CM | POA: Diagnosis not present

## 2017-07-01 DIAGNOSIS — D72829 Elevated white blood cell count, unspecified: Secondary | ICD-10-CM | POA: Diagnosis not present

## 2017-07-09 DIAGNOSIS — D72829 Elevated white blood cell count, unspecified: Secondary | ICD-10-CM | POA: Diagnosis not present

## 2017-07-20 ENCOUNTER — Other Ambulatory Visit: Payer: Self-pay | Admitting: Gastroenterology

## 2017-07-20 DIAGNOSIS — R1310 Dysphagia, unspecified: Secondary | ICD-10-CM

## 2017-07-20 DIAGNOSIS — D509 Iron deficiency anemia, unspecified: Secondary | ICD-10-CM | POA: Diagnosis not present

## 2017-07-20 DIAGNOSIS — R1084 Generalized abdominal pain: Secondary | ICD-10-CM | POA: Diagnosis not present

## 2017-07-20 DIAGNOSIS — K625 Hemorrhage of anus and rectum: Secondary | ICD-10-CM | POA: Diagnosis not present

## 2017-07-23 ENCOUNTER — Ambulatory Visit
Admission: RE | Admit: 2017-07-23 | Discharge: 2017-07-23 | Disposition: A | Discharge status: Home / Self Care | Payer: PPO | Source: Ambulatory Visit | Attending: Gastroenterology | Admitting: Gastroenterology

## 2017-07-23 DIAGNOSIS — K219 Gastro-esophageal reflux disease without esophagitis: Secondary | ICD-10-CM | POA: Diagnosis not present

## 2017-07-23 DIAGNOSIS — Q394 Esophageal web: Secondary | ICD-10-CM | POA: Diagnosis not present

## 2017-07-23 DIAGNOSIS — R1310 Dysphagia, unspecified: Secondary | ICD-10-CM | POA: Diagnosis not present

## 2017-07-27 ENCOUNTER — Ambulatory Visit: Admission: RE | Admit: 2017-07-27 | Payer: PPO | Source: Ambulatory Visit

## 2017-07-27 ENCOUNTER — Ambulatory Visit
Admission: RE | Admit: 2017-07-27 | Discharge: 2017-07-27 | Disposition: A | Discharge status: Home / Self Care | Payer: PPO | Source: Ambulatory Visit | Attending: Gastroenterology | Admitting: Gastroenterology

## 2017-07-27 ENCOUNTER — Other Ambulatory Visit
Admission: RE | Admit: 2017-07-27 | Discharge: 2017-07-27 | Disposition: A | Discharge status: Home / Self Care | Payer: PPO | Source: Ambulatory Visit | Attending: Gastroenterology | Admitting: Gastroenterology

## 2017-07-27 DIAGNOSIS — I700 Atherosclerosis of aorta: Secondary | ICD-10-CM | POA: Insufficient documentation

## 2017-07-27 DIAGNOSIS — N281 Cyst of kidney, acquired: Secondary | ICD-10-CM | POA: Diagnosis not present

## 2017-07-27 DIAGNOSIS — R590 Localized enlarged lymph nodes: Secondary | ICD-10-CM | POA: Insufficient documentation

## 2017-07-27 DIAGNOSIS — K5730 Diverticulosis of large intestine without perforation or abscess without bleeding: Secondary | ICD-10-CM | POA: Diagnosis not present

## 2017-07-27 DIAGNOSIS — R1084 Generalized abdominal pain: Secondary | ICD-10-CM | POA: Diagnosis not present

## 2017-07-27 DIAGNOSIS — R109 Unspecified abdominal pain: Secondary | ICD-10-CM | POA: Diagnosis not present

## 2017-07-27 DIAGNOSIS — K8689 Other specified diseases of pancreas: Secondary | ICD-10-CM | POA: Insufficient documentation

## 2017-07-27 HISTORY — DX: Disorder of kidney and ureter, unspecified: N28.9

## 2017-07-27 HISTORY — DX: Heart failure, unspecified: I50.9

## 2017-07-27 HISTORY — DX: Essential (primary) hypertension: I10

## 2017-07-27 LAB — CREATININE, SERUM
CREATININE: 1.45 mg/dL — AB (ref 0.44–1.00)
GFR calc Af Amer: 36 mL/min — ABNORMAL LOW (ref >60)
GFR calc non Af Amer: 31 mL/min — ABNORMAL LOW (ref >60)

## 2017-07-27 MED ORDER — IOPAMIDOL (ISOVUE-300) INJECTION 61%
75.0000 mL | Freq: Once | INTRAVENOUS | Status: AC | PRN
  Administered 2017-07-27: 75 mL via INTRAVENOUS

## 2017-08-10 DIAGNOSIS — R1084 Generalized abdominal pain: Secondary | ICD-10-CM | POA: Diagnosis not present

## 2017-08-10 DIAGNOSIS — G8929 Other chronic pain: Secondary | ICD-10-CM | POA: Diagnosis not present

## 2017-08-10 DIAGNOSIS — R1012 Left upper quadrant pain: Secondary | ICD-10-CM | POA: Diagnosis not present

## 2017-08-10 DIAGNOSIS — R1314 Dysphagia, pharyngoesophageal phase: Secondary | ICD-10-CM | POA: Diagnosis not present

## 2017-08-10 DIAGNOSIS — K529 Noninfective gastroenteritis and colitis, unspecified: Secondary | ICD-10-CM | POA: Diagnosis not present

## 2017-08-14 DIAGNOSIS — K625 Hemorrhage of anus and rectum: Secondary | ICD-10-CM | POA: Diagnosis not present

## 2017-09-07 DIAGNOSIS — R198 Other specified symptoms and signs involving the digestive system and abdomen: Secondary | ICD-10-CM | POA: Diagnosis not present

## 2017-09-07 DIAGNOSIS — R1084 Generalized abdominal pain: Secondary | ICD-10-CM | POA: Diagnosis not present

## 2017-09-07 DIAGNOSIS — K862 Cyst of pancreas: Secondary | ICD-10-CM | POA: Diagnosis not present

## 2017-09-07 DIAGNOSIS — K5792 Diverticulitis of intestine, part unspecified, without perforation or abscess without bleeding: Secondary | ICD-10-CM | POA: Diagnosis not present

## 2017-09-07 DIAGNOSIS — N949 Unspecified condition associated with female genital organs and menstrual cycle: Secondary | ICD-10-CM | POA: Diagnosis not present

## 2017-09-07 DIAGNOSIS — R1013 Epigastric pain: Secondary | ICD-10-CM | POA: Diagnosis not present

## 2017-09-07 DIAGNOSIS — K6289 Other specified diseases of anus and rectum: Secondary | ICD-10-CM | POA: Diagnosis not present

## 2017-09-07 DIAGNOSIS — R195 Other fecal abnormalities: Secondary | ICD-10-CM | POA: Diagnosis not present

## 2017-09-30 DIAGNOSIS — H3533 Angioid streaks of macula: Secondary | ICD-10-CM | POA: Diagnosis not present

## 2017-09-30 DIAGNOSIS — H40003 Preglaucoma, unspecified, bilateral: Secondary | ICD-10-CM | POA: Diagnosis not present

## 2017-10-21 DIAGNOSIS — L900 Lichen sclerosus et atrophicus: Secondary | ICD-10-CM | POA: Diagnosis not present

## 2017-11-16 DIAGNOSIS — K649 Unspecified hemorrhoids: Secondary | ICD-10-CM | POA: Diagnosis not present

## 2017-11-16 DIAGNOSIS — R198 Other specified symptoms and signs involving the digestive system and abdomen: Secondary | ICD-10-CM | POA: Diagnosis not present

## 2017-11-16 DIAGNOSIS — R1013 Epigastric pain: Secondary | ICD-10-CM | POA: Diagnosis not present

## 2017-11-30 DIAGNOSIS — E7800 Pure hypercholesterolemia, unspecified: Secondary | ICD-10-CM | POA: Diagnosis not present

## 2017-11-30 DIAGNOSIS — J449 Chronic obstructive pulmonary disease, unspecified: Secondary | ICD-10-CM | POA: Diagnosis not present

## 2017-11-30 DIAGNOSIS — I10 Essential (primary) hypertension: Secondary | ICD-10-CM | POA: Diagnosis not present

## 2017-11-30 DIAGNOSIS — M150 Primary generalized (osteo)arthritis: Secondary | ICD-10-CM | POA: Diagnosis not present

## 2017-11-30 DIAGNOSIS — K219 Gastro-esophageal reflux disease without esophagitis: Secondary | ICD-10-CM | POA: Diagnosis not present

## 2017-11-30 DIAGNOSIS — M1A3710 Chronic gout due to renal impairment, right ankle and foot, without tophus (tophi): Secondary | ICD-10-CM | POA: Diagnosis not present

## 2017-11-30 DIAGNOSIS — N184 Chronic kidney disease, stage 4 (severe): Secondary | ICD-10-CM | POA: Diagnosis not present

## 2017-11-30 DIAGNOSIS — D72829 Elevated white blood cell count, unspecified: Secondary | ICD-10-CM | POA: Diagnosis not present

## 2017-11-30 DIAGNOSIS — I5032 Chronic diastolic (congestive) heart failure: Secondary | ICD-10-CM | POA: Diagnosis not present

## 2017-12-02 DIAGNOSIS — L900 Lichen sclerosus et atrophicus: Secondary | ICD-10-CM | POA: Diagnosis not present

## 2017-12-09 DIAGNOSIS — I129 Hypertensive chronic kidney disease with stage 1 through stage 4 chronic kidney disease, or unspecified chronic kidney disease: Secondary | ICD-10-CM | POA: Diagnosis not present

## 2017-12-09 DIAGNOSIS — R809 Proteinuria, unspecified: Secondary | ICD-10-CM | POA: Diagnosis not present

## 2017-12-09 DIAGNOSIS — N183 Chronic kidney disease, stage 3 (moderate): Secondary | ICD-10-CM | POA: Diagnosis not present

## 2017-12-09 DIAGNOSIS — N2581 Secondary hyperparathyroidism of renal origin: Secondary | ICD-10-CM | POA: Diagnosis not present

## 2017-12-09 DIAGNOSIS — D631 Anemia in chronic kidney disease: Secondary | ICD-10-CM | POA: Diagnosis not present

## 2017-12-18 ENCOUNTER — Other Ambulatory Visit: Payer: Self-pay

## 2017-12-18 ENCOUNTER — Inpatient Hospital Stay: Payer: PPO | Attending: Oncology | Admitting: Oncology

## 2017-12-18 ENCOUNTER — Inpatient Hospital Stay: Payer: PPO

## 2017-12-18 ENCOUNTER — Encounter: Payer: Self-pay | Admitting: Oncology

## 2017-12-18 DIAGNOSIS — I110 Hypertensive heart disease with heart failure: Secondary | ICD-10-CM | POA: Diagnosis not present

## 2017-12-18 DIAGNOSIS — D72829 Elevated white blood cell count, unspecified: Secondary | ICD-10-CM | POA: Insufficient documentation

## 2017-12-18 DIAGNOSIS — N289 Disorder of kidney and ureter, unspecified: Secondary | ICD-10-CM | POA: Diagnosis not present

## 2017-12-18 DIAGNOSIS — Z79899 Other long term (current) drug therapy: Secondary | ICD-10-CM | POA: Insufficient documentation

## 2017-12-18 DIAGNOSIS — I10 Essential (primary) hypertension: Secondary | ICD-10-CM | POA: Diagnosis not present

## 2017-12-18 DIAGNOSIS — I509 Heart failure, unspecified: Secondary | ICD-10-CM | POA: Diagnosis not present

## 2017-12-18 LAB — IRON AND TIBC
Iron: 84 ug/dL (ref 28–170)
Saturation Ratios: 25 % (ref 10.4–31.8)
TIBC: 331 ug/dL (ref 250–450)
UIBC: 247 ug/dL

## 2017-12-18 LAB — CBC WITH DIFFERENTIAL/PLATELET
ABS IMMATURE GRANULOCYTES: 0.04 10*3/uL (ref 0.00–0.07)
Basophils Absolute: 0 10*3/uL (ref 0.0–0.1)
Basophils Relative: 0 %
EOS PCT: 2 %
Eosinophils Absolute: 0.2 10*3/uL (ref 0.0–0.5)
HCT: 40.2 % (ref 36.0–46.0)
Hemoglobin: 13.1 g/dL (ref 12.0–15.0)
Immature Granulocytes: 0 %
Lymphocytes Relative: 17 %
Lymphs Abs: 1.6 10*3/uL (ref 0.7–4.0)
MCH: 29.8 pg (ref 26.0–34.0)
MCHC: 32.6 g/dL (ref 30.0–36.0)
MCV: 91.4 fL (ref 80.0–100.0)
MONO ABS: 0.6 10*3/uL (ref 0.1–1.0)
Monocytes Relative: 7 %
NEUTROS ABS: 7 10*3/uL (ref 1.7–7.7)
Neutrophils Relative %: 74 %
Platelets: 198 10*3/uL (ref 150–400)
RBC: 4.4 MIL/uL (ref 3.87–5.11)
RDW: 14 % (ref 11.5–15.5)
WBC: 9.6 10*3/uL (ref 4.0–10.5)
nRBC: 0 % (ref 0.0–0.2)

## 2017-12-18 LAB — LACTATE DEHYDROGENASE: LDH: 135 U/L (ref 98–192)

## 2017-12-18 LAB — FERRITIN: FERRITIN: 26 ng/mL (ref 11–307)

## 2017-12-18 NOTE — Progress Notes (Signed)
Here for new pt evaluation. Per pt frequent SOB  Feels cold all the time. Pulse ox 97% on RA  Related mid abd to under breast constant" tight " pain   Here w daughter who asst w med reconciliation.

## 2017-12-18 NOTE — Progress Notes (Signed)
[place]  [phone] [phone]  ID: Gloria Day OB: 06-28-1928  MR#: [id number]  DPO#:[id number]  Patient Care Team: Sofie Hartigan, MD as PCP - General (Family Medicine)  CHIEF COMPLAINT: Leukocytosis  INTERVAL HISTORY: Patient is an 84 year old female who was noted to have a persistently elevated white blood cell count on routine blood work.  She currently feels well and is asymptomatic.  She has no neurologic complaints.  She denies any recent fevers or illnesses.  She has good appetite and denies weight loss.  She has no chest pain or shortness of breath.  She denies any nausea, vomiting, constipation, or diarrhea.  She has no urinary complaints.  Patient feels at her baseline offers no specific complaints today.  REVIEW OF SYSTEMS:   Review of Systems  Constitutional: Negative.  Negative for fever, malaise/fatigue and weight loss.  Respiratory: Negative.  Negative for cough and shortness of breath.   Cardiovascular: Negative.  Negative for chest pain and leg swelling.  Gastrointestinal: Negative.  Negative for abdominal pain, blood in stool and melena.  Genitourinary: Negative.  Negative for dysuria.  Musculoskeletal: Negative.  Negative for back pain.  Skin: Negative.  Negative for rash.  Neurological: Negative.  Negative for focal weakness, weakness and headaches.  Psychiatric/Behavioral: Negative.  The patient is not nervous/anxious.     As per HPI. Otherwise, a complete review of systems is negative.  PAST MEDICAL HISTORY: Past Medical History:  Diagnosis Date  . CHF (congestive heart failure) ([place])   . Hypertension   . Renal insufficiency     PAST SURGICAL HISTORY: Reviewed and unchanged.  FAMILY HISTORY: Family History  Problem Relation Age of Onset  . Heart attack Mother   . Heart attack Father   . Diabetes Sister   . Skin cancer Brother   . Lung cancer Brother   . Colon cancer Brother   . Bone cancer Brother    . Parkinson's disease Brother   . Heart attack Brother     ADVANCED DIRECTIVES (Y/N):  N  HEALTH MAINTENANCE: Social History   Tobacco Use  . Smoking status: Never Smoker  . Smokeless tobacco: Never Used  Substance Use Topics  . Alcohol use: Never    Frequency: Never  . Drug use: Never     Colonoscopy:  PAP:  Bone density:  Lipid panel:  No Known Allergies  Current Outpatient Medications  Medication Sig Dispense Refill  . allopurinol (ZYLOPRIM) 100 MG tablet Take 100 mg by mouth daily.     . calcitRIOL (ROCALTROL) 0.25 MCG capsule Take 0.25 mcg by mouth daily.     . clobetasol ointment (TEMOVATE) 0.05 % APPLY A PEA SIZED AMOUNT DAILY FOR 3 WEEKS THEN EVERY OTHER DAY FOR 3 WEEKS THEN TWICE WEEKLY FOR MAINTENANCE.  1  . diltiazem (CARDIZEM CD) 180 MG 24 hr capsule Take 180 mg by mouth daily.     . ferrous sulfate 325 (65 FE) MG tablet Take 325 mg by mouth daily with breakfast.     . furosemide (LASIX) 20 MG tablet Take 40 mg by mouth daily.     . hydrocortisone 2.5 % cream APPLY CREAM TWICE DAILY AFTER BM  1  . methylcellulose (CITRUCEL) oral powder Take by mouth daily.     . Multiple Vitamins-Minerals (PRESERVISION AREDS 2+MULTI VIT PO) Take by mouth.    . nystatin (MYCOSTATIN/NYSTOP) powder Apply topically.    Vladimir Faster Glycol-Propyl Glycol 0.4-0.3 % SOLN Apply to eye.     Marland Kitchen  Polyethylene Glycol 3350 (PEG 3350) POWD Take by mouth.    . Probiotic Product ([employer] PROBIOTIC MAXIMUM STRENGTH) CAPS Take by mouth.     . RABEprazole (ACIPHEX) 20 MG tablet Take 20 mg by mouth daily.  6  . simvastatin (ZOCOR) 40 MG tablet TAKE ONE TABLET BY MOUTH ONCE NIGHTLY    . vitamin B-12 (CYANOCOBALAMIN) 1000 MCG tablet Take 1,000 mcg by mouth daily.     . metoprolol tartrate (LOPRESSOR) 50 MG tablet Take by mouth.     No current facility-administered medications for this visit.     OBJECTIVE: Vitals:   12/18/17 1343  BP: (!) 191/79  Pulse: 66  Resp: 18  Temp: (!) 96 F (35.6 C)       Body mass index is 37.11 kg/m.    ECOG FS:1 - Symptomatic but completely ambulatory  General: Well-developed, well-nourished, no acute distress. Eyes: Pink conjunctiva, anicteric sclera. HEENT: Normocephalic, moist mucous membranes, clear oropharnyx. Lungs: Clear to auscultation bilaterally. Heart: Regular rate and rhythm. No rubs, murmurs, or gallops. Abdomen: Soft, nontender, nondistended. No organomegaly noted, normoactive bowel sounds. Musculoskeletal: No edema, cyanosis, or clubbing. Neuro: Alert, answering all questions appropriately. Cranial nerves grossly intact. Skin: No rashes or petechiae noted. Psych: Normal affect. Lymphatics: No cervical, calvicular, axillary or inguinal LAD.   LAB RESULTS:  Lab Results  Component Value Date   NA 138 05/05/2013   K 4.1 05/05/2013   CL 104 05/05/2013   CO2 34 (H) 05/05/2013   GLUCOSE 105 (H) 05/05/2013   BUN 37 (H) 05/05/2013   CREATININE 1.45 (H) 07/27/2017   CALCIUM 8.2 (L) 05/05/2013   PROT 7.0 04/29/2013   ALBUMIN 3.2 (L) 05/05/2013   AST 24 04/29/2013   ALT 16 04/29/2013   ALKPHOS 122 (H) 04/29/2013   BILITOT 0.7 04/29/2013   GFRNONAA 31 (L) 07/27/2017   GFRAA 36 (L) 07/27/2017    Lab Results  Component Value Date   WBC 9.6 12/18/2017   NEUTROABS 7.0 12/18/2017   HGB 13.1 12/18/2017   HCT 40.2 12/18/2017   MCV 91.4 12/18/2017   PLT 198 12/18/2017     STUDIES: No results found.  ASSESSMENT: Leukocytosis  PLAN:    1. Leukocytosis: Patient's white blood cell count is within normal limits today.  All of her other laboratory work including iron stores, LDH, peripheral blood flow cytometry, and BCR-ABL mutation are all also within normal limits.  No intervention is needed at this time.  Patient will return to clinic in 3 months with repeat laboratory work and further evaluation.  If her white blood cell count continues to be within normal limits, she likely can be discharged from clinic.  I spent a total of  45 minutes face-to-face with the patient of which greater than 50% of the visit was spent in counseling and coordination of care as detailed above.   Patient expressed understanding and was in agreement with this plan. She also understands that She can call clinic at any time with any questions, concerns, or complaints.    Lloyd Huger, MD   12/25/2017 11:30 AM

## 2017-12-22 LAB — COMP PANEL: LEUKEMIA/LYMPHOMA

## 2017-12-23 LAB — BCR-ABL1, CML/ALL, PCR, QUANT

## 2018-03-11 ENCOUNTER — Other Ambulatory Visit: Payer: Self-pay

## 2018-03-11 DIAGNOSIS — D72829 Elevated white blood cell count, unspecified: Secondary | ICD-10-CM

## 2018-03-18 ENCOUNTER — Ambulatory Visit: Payer: PPO | Admitting: Hematology and Oncology

## 2018-03-18 ENCOUNTER — Other Ambulatory Visit: Payer: PPO

## 2018-05-31 DIAGNOSIS — N184 Chronic kidney disease, stage 4 (severe): Secondary | ICD-10-CM | POA: Diagnosis not present

## 2018-05-31 DIAGNOSIS — M1A3710 Chronic gout due to renal impairment, right ankle and foot, without tophus (tophi): Secondary | ICD-10-CM | POA: Diagnosis not present

## 2018-05-31 DIAGNOSIS — K219 Gastro-esophageal reflux disease without esophagitis: Secondary | ICD-10-CM | POA: Diagnosis not present

## 2018-05-31 DIAGNOSIS — J449 Chronic obstructive pulmonary disease, unspecified: Secondary | ICD-10-CM | POA: Diagnosis not present

## 2018-05-31 DIAGNOSIS — M8949 Other hypertrophic osteoarthropathy, multiple sites: Secondary | ICD-10-CM | POA: Diagnosis not present

## 2018-05-31 DIAGNOSIS — I10 Essential (primary) hypertension: Secondary | ICD-10-CM | POA: Diagnosis not present

## 2018-05-31 DIAGNOSIS — E7800 Pure hypercholesterolemia, unspecified: Secondary | ICD-10-CM | POA: Diagnosis not present

## 2018-05-31 DIAGNOSIS — I5032 Chronic diastolic (congestive) heart failure: Secondary | ICD-10-CM | POA: Diagnosis not present

## 2018-05-31 DIAGNOSIS — Z0000 Encounter for general adult medical examination without abnormal findings: Secondary | ICD-10-CM | POA: Diagnosis not present

## 2018-08-09 ENCOUNTER — Ambulatory Visit
Admission: EM | Admit: 2018-08-09 | Discharge: 2018-08-09 | Disposition: A | Discharge status: Home / Self Care | Payer: Medicare HMO | Attending: Family Medicine | Admitting: Family Medicine

## 2018-08-09 ENCOUNTER — Encounter: Payer: Self-pay | Admitting: Emergency Medicine

## 2018-08-09 ENCOUNTER — Other Ambulatory Visit: Payer: Self-pay

## 2018-08-09 DIAGNOSIS — B029 Zoster without complications: Secondary | ICD-10-CM | POA: Diagnosis not present

## 2018-08-09 MED ORDER — TRAMADOL HCL 50 MG PO TABS: 50.0000 mg | ORAL_TABLET | Freq: Two times a day (BID) | ORAL | 0 refills | Status: DC | PRN

## 2018-08-09 MED ORDER — VALACYCLOVIR HCL 1 G PO TABS: 1000.0000 mg | ORAL_TABLET | Freq: Every day | ORAL | 0 refills | Status: DC

## 2018-08-09 NOTE — ED Provider Notes (Signed)
[facility]    CSN: [id number] Arrival date & time: 08/09/18  1528     History   Chief Complaint Chief Complaint  Patient presents with  . Herpes Zoster    HPI Gloria Day is a 85 y.o. female.   85 yo female with a c/o rash on the right side of her back since last week. States rash is burning and stinging and pain radiates sharply around towards her right chest and breast. Denies any fevers or chills.      Past Medical History:  Diagnosis Date  . CHF (congestive heart failure) ([place])   . Hypertension   . Renal insufficiency     Patient Active Problem List   Diagnosis Date Noted  . Leukocytosis 12/18/2017    Past Surgical History:  Procedure Laterality Date  . ABDOMINAL HYSTERECTOMY    . CHOLECYSTECTOMY    . TONSILLECTOMY    . TOTAL KNEE ARTHROPLASTY      OB History   No obstetric history on file.      Home Medications    Prior to Admission medications   Medication Sig Start Date End Date Taking? Authorizing Provider  allopurinol (ZYLOPRIM) 100 MG tablet Take 100 mg by mouth daily.    Yes [provider]  calcitRIOL (ROCALTROL) 0.25 MCG capsule Take 0.25 mcg by mouth daily.    Yes [provider]  diltiazem (CARDIZEM CD) 180 MG 24 hr capsule Take 180 mg by mouth daily.  11/29/13  Yes [provider]  ferrous sulfate 325 (65 FE) MG tablet Take 325 mg by mouth daily with breakfast.    Yes [provider]  furosemide (LASIX) 20 MG tablet Take 40 mg by mouth daily.    Yes [provider]  methylcellulose (CITRUCEL) oral powder Take by mouth daily.    Yes [provider]  metoprolol tartrate (LOPRESSOR) 50 MG tablet Take by mouth. 11/29/13  Yes [provider]  Multiple Vitamins-Minerals (PRESERVISION AREDS 2+MULTI VIT PO) Take by mouth.   Yes [provider]  pantoprazole (PROTONIX) 40 MG tablet Take 40 mg by mouth daily.   Yes [provider]  Probiotic Product ([employer]  PROBIOTIC MAXIMUM STRENGTH) CAPS Take by mouth.    Yes [provider]  simvastatin (ZOCOR) 40 MG tablet TAKE ONE TABLET BY MOUTH ONCE NIGHTLY 02/02/17  Yes [provider]  vitamin B-12 (CYANOCOBALAMIN) 1000 MCG tablet Take 1,000 mcg by mouth daily.    Yes [provider]  clobetasol ointment (TEMOVATE) 0.05 % APPLY A PEA SIZED AMOUNT DAILY FOR 3 WEEKS THEN EVERY OTHER DAY FOR 3 WEEKS THEN TWICE WEEKLY FOR MAINTENANCE. 10/21/17   [provider]  hydrocortisone 2.5 % cream APPLY CREAM TWICE DAILY AFTER BM 12/03/17   [provider]  nystatin (MYCOSTATIN/NYSTOP) powder Apply topically. 12/02/17 12/02/18  [provider]  Polyethyl Glycol-Propyl Glycol 0.4-0.3 % SOLN Apply to eye.     [provider]  Polyethylene Glycol 3350 (PEG 3350) POWD Take by mouth.    [provider]  RABEprazole (ACIPHEX) 20 MG tablet Take 20 mg by mouth daily. 12/08/17   [provider]  traMADol (ULTRAM) 50 MG tablet Take 1 tablet (50 mg total) by mouth every 12 (twelve) hours as needed. 08/09/18   Norval Gable, MD  valACYclovir (VALTREX) 1000 MG tablet Take 1 tablet (1,000 mg total) by mouth daily. 08/09/18   Norval Gable, MD    Family History Family History  Problem Relation Age of  Onset  . Heart attack Mother   . Heart attack Father   . Diabetes Sister   . Skin cancer Brother   . Lung cancer Brother   . Colon cancer Brother   . Bone cancer Brother   . Parkinson's disease Brother   . Heart attack Brother     Social History Social History   Tobacco Use  . Smoking status: Never Smoker  . Smokeless tobacco: Never Used  Substance Use Topics  . Alcohol use: Never    Frequency: Never  . Drug use: Never     Allergies   Patient has no known allergies.   Review of Systems Review of Systems   Physical Exam Triage Vital Signs ED Triage Vitals  Enc Vitals Group     BP 08/09/18 1623 (!) 138/59     Pulse Rate 08/09/18 1623  63     Resp 08/09/18 1623 18     Temp 08/09/18 1623 97.8 F (36.6 C)     Temp src --      SpO2 08/09/18 1623 98 %     Weight 08/09/18 1614 209 lb (94.8 kg)     Height 08/09/18 1614 5\' 5"  (1.651 m)     Head Circumference --      Peak Flow --      Pain Score 08/09/18 1613 9     Pain Loc --      Pain Edu? --      Excl. in [place]? --    No data found.  Updated Vital Signs BP (!) 138/59 (BP Location: Left Arm)   Pulse 63   Temp 97.8 F (36.6 C)   Resp 18   Ht 5\' 5"  (1.651 m)   Wt 94.8 kg   SpO2 98%   BMI 34.78 kg/m   Visual Acuity Right Eye Distance:   Left Eye Distance:   Bilateral Distance:    Right Eye Near:   Left Eye Near:    Bilateral Near:     Physical Exam Vitals signs and nursing note reviewed.  Constitutional:      General: She is not in acute distress.    Appearance: She is not toxic-appearing or diaphoretic.  Skin:    Findings: Rash present. Rash is vesicular.     Comments: Vesicular erythematous rash on right mid back   Neurological:     Mental Status: She is alert.      UC Treatments / Results  Labs (all labs ordered are listed, but only abnormal results are displayed) Labs Reviewed - No data to display  EKG   Radiology No results found.  Procedures Procedures (including critical care time)  Medications Ordered in UC Medications - No data to display  Initial Impression / Assessment and Plan / UC Course  I have reviewed the triage vital signs and the nursing notes.  Pertinent labs & imaging results that were available during my care of the patient were reviewed by me and considered in my medical decision making (see chart for details).      Final Clinical Impressions(s) / UC Diagnoses   Final diagnoses:  Herpes zoster without complication    ED Prescriptions    Medication Sig Dispense Auth. Provider   valACYclovir (VALTREX) 1000 MG tablet Take 1 tablet (1,000 mg total) by mouth daily. 7 tablet Norval Gable, MD   traMADol  (ULTRAM) 50 MG tablet Take 1 tablet (50 mg total) by mouth every 12 (twelve) hours as needed. 10 tablet Norval Gable, MD  1. diagnosis reviewed with patient and daughter  2. rx as per orders above; reviewed possible side effects, interactions, risks and benefits  3. Follow-up prn if symptoms worsen or don't improve   Controlled Substance Prescriptions [place] Controlled Substance Registry consulted? Not Applicable   Norval Gable, MD 08/09/18 [phone]

## 2018-08-09 NOTE — ED Triage Notes (Signed)
Patient states she thinks she has shingles which started last Tuesday

## 2019-02-09 DIAGNOSIS — N184 Chronic kidney disease, stage 4 (severe): Secondary | ICD-10-CM | POA: Diagnosis not present

## 2019-02-09 DIAGNOSIS — M8949 Other hypertrophic osteoarthropathy, multiple sites: Secondary | ICD-10-CM | POA: Diagnosis not present

## 2019-02-09 DIAGNOSIS — I5032 Chronic diastolic (congestive) heart failure: Secondary | ICD-10-CM | POA: Diagnosis not present

## 2019-02-09 DIAGNOSIS — K219 Gastro-esophageal reflux disease without esophagitis: Secondary | ICD-10-CM | POA: Diagnosis not present

## 2019-02-09 DIAGNOSIS — M1A3710 Chronic gout due to renal impairment, right ankle and foot, without tophus (tophi): Secondary | ICD-10-CM | POA: Diagnosis not present

## 2019-02-09 DIAGNOSIS — D72829 Elevated white blood cell count, unspecified: Secondary | ICD-10-CM | POA: Diagnosis not present

## 2019-02-09 DIAGNOSIS — R6889 Other general symptoms and signs: Secondary | ICD-10-CM | POA: Diagnosis not present

## 2019-02-09 DIAGNOSIS — I130 Hypertensive heart and chronic kidney disease with heart failure and stage 1 through stage 4 chronic kidney disease, or unspecified chronic kidney disease: Secondary | ICD-10-CM | POA: Diagnosis not present

## 2019-02-09 DIAGNOSIS — E7800 Pure hypercholesterolemia, unspecified: Secondary | ICD-10-CM | POA: Diagnosis not present

## 2019-02-09 DIAGNOSIS — Z23 Encounter for immunization: Secondary | ICD-10-CM | POA: Diagnosis not present

## 2019-02-09 DIAGNOSIS — J449 Chronic obstructive pulmonary disease, unspecified: Secondary | ICD-10-CM | POA: Diagnosis not present

## 2019-06-06 DIAGNOSIS — K5900 Constipation, unspecified: Secondary | ICD-10-CM | POA: Diagnosis not present

## 2019-06-06 DIAGNOSIS — K862 Cyst of pancreas: Secondary | ICD-10-CM | POA: Diagnosis not present

## 2019-06-06 DIAGNOSIS — N8189 Other female genital prolapse: Secondary | ICD-10-CM | POA: Diagnosis not present

## 2019-06-06 DIAGNOSIS — K219 Gastro-esophageal reflux disease without esophagitis: Secondary | ICD-10-CM | POA: Diagnosis not present

## 2019-07-06 ENCOUNTER — Other Ambulatory Visit (HOSPITAL_COMMUNITY): Payer: Self-pay | Admitting: Gastroenterology

## 2019-07-06 ENCOUNTER — Other Ambulatory Visit: Payer: Self-pay | Admitting: Gastroenterology

## 2019-07-06 DIAGNOSIS — K862 Cyst of pancreas: Secondary | ICD-10-CM

## 2019-07-19 ENCOUNTER — Other Ambulatory Visit: Payer: Self-pay | Admitting: Gastroenterology

## 2019-07-19 ENCOUNTER — Other Ambulatory Visit: Payer: Self-pay

## 2019-07-19 ENCOUNTER — Ambulatory Visit
Admission: RE | Admit: 2019-07-19 | Discharge: 2019-07-19 | Disposition: A | Discharge status: Home / Self Care | Payer: Medicare HMO | Source: Ambulatory Visit | Attending: Gastroenterology | Admitting: Gastroenterology

## 2019-07-19 DIAGNOSIS — K862 Cyst of pancreas: Secondary | ICD-10-CM

## 2019-07-19 DIAGNOSIS — K8050 Calculus of bile duct without cholangitis or cholecystitis without obstruction: Secondary | ICD-10-CM | POA: Diagnosis not present

## 2019-07-19 DIAGNOSIS — R932 Abnormal findings on diagnostic imaging of liver and biliary tract: Secondary | ICD-10-CM | POA: Diagnosis not present

## 2019-07-19 DIAGNOSIS — R933 Abnormal findings on diagnostic imaging of other parts of digestive tract: Secondary | ICD-10-CM | POA: Diagnosis not present

## 2019-07-19 MED ORDER — GADOBUTROL 1 MMOL/ML IV SOLN
9.0000 mL | Freq: Once | INTRAVENOUS | Status: AC | PRN
  Administered 2019-07-19: 9 mL via INTRAVENOUS

## 2019-08-10 DIAGNOSIS — M1A3710 Chronic gout due to renal impairment, right ankle and foot, without tophus (tophi): Secondary | ICD-10-CM | POA: Diagnosis not present

## 2019-08-10 DIAGNOSIS — N184 Chronic kidney disease, stage 4 (severe): Secondary | ICD-10-CM | POA: Diagnosis not present

## 2019-08-10 DIAGNOSIS — J449 Chronic obstructive pulmonary disease, unspecified: Secondary | ICD-10-CM | POA: Diagnosis not present

## 2019-08-10 DIAGNOSIS — D72829 Elevated white blood cell count, unspecified: Secondary | ICD-10-CM | POA: Diagnosis not present

## 2019-08-10 DIAGNOSIS — I5032 Chronic diastolic (congestive) heart failure: Secondary | ICD-10-CM | POA: Diagnosis not present

## 2019-08-10 DIAGNOSIS — K219 Gastro-esophageal reflux disease without esophagitis: Secondary | ICD-10-CM | POA: Diagnosis not present

## 2019-08-10 DIAGNOSIS — E7800 Pure hypercholesterolemia, unspecified: Secondary | ICD-10-CM | POA: Diagnosis not present

## 2019-08-10 DIAGNOSIS — M8949 Other hypertrophic osteoarthropathy, multiple sites: Secondary | ICD-10-CM | POA: Diagnosis not present

## 2019-08-10 DIAGNOSIS — I130 Hypertensive heart and chronic kidney disease with heart failure and stage 1 through stage 4 chronic kidney disease, or unspecified chronic kidney disease: Secondary | ICD-10-CM | POA: Diagnosis not present

## 2019-08-29 DIAGNOSIS — R7309 Other abnormal glucose: Secondary | ICD-10-CM | POA: Diagnosis not present

## 2019-08-29 DIAGNOSIS — E782 Mixed hyperlipidemia: Secondary | ICD-10-CM | POA: Diagnosis not present

## 2019-09-16 DIAGNOSIS — I129 Hypertensive chronic kidney disease with stage 1 through stage 4 chronic kidney disease, or unspecified chronic kidney disease: Secondary | ICD-10-CM | POA: Diagnosis not present

## 2019-09-16 DIAGNOSIS — R809 Proteinuria, unspecified: Secondary | ICD-10-CM | POA: Diagnosis not present

## 2019-09-16 DIAGNOSIS — N184 Chronic kidney disease, stage 4 (severe): Secondary | ICD-10-CM | POA: Diagnosis not present

## 2019-09-16 DIAGNOSIS — E875 Hyperkalemia: Secondary | ICD-10-CM | POA: Diagnosis not present

## 2019-09-16 DIAGNOSIS — N2581 Secondary hyperparathyroidism of renal origin: Secondary | ICD-10-CM | POA: Diagnosis not present

## 2019-09-16 DIAGNOSIS — D631 Anemia in chronic kidney disease: Secondary | ICD-10-CM | POA: Diagnosis not present

## 2019-12-26 DIAGNOSIS — R809 Proteinuria, unspecified: Secondary | ICD-10-CM | POA: Diagnosis not present

## 2019-12-26 DIAGNOSIS — N2581 Secondary hyperparathyroidism of renal origin: Secondary | ICD-10-CM | POA: Diagnosis not present

## 2019-12-26 DIAGNOSIS — E875 Hyperkalemia: Secondary | ICD-10-CM | POA: Diagnosis not present

## 2019-12-26 DIAGNOSIS — N184 Chronic kidney disease, stage 4 (severe): Secondary | ICD-10-CM | POA: Diagnosis not present

## 2019-12-26 DIAGNOSIS — I129 Hypertensive chronic kidney disease with stage 1 through stage 4 chronic kidney disease, or unspecified chronic kidney disease: Secondary | ICD-10-CM | POA: Diagnosis not present

## 2019-12-26 DIAGNOSIS — D631 Anemia in chronic kidney disease: Secondary | ICD-10-CM | POA: Diagnosis not present

## 2019-12-27 DIAGNOSIS — R809 Proteinuria, unspecified: Secondary | ICD-10-CM | POA: Diagnosis not present

## 2019-12-27 DIAGNOSIS — N2581 Secondary hyperparathyroidism of renal origin: Secondary | ICD-10-CM | POA: Diagnosis not present

## 2019-12-27 DIAGNOSIS — N184 Chronic kidney disease, stage 4 (severe): Secondary | ICD-10-CM | POA: Diagnosis not present

## 2019-12-27 DIAGNOSIS — I129 Hypertensive chronic kidney disease with stage 1 through stage 4 chronic kidney disease, or unspecified chronic kidney disease: Secondary | ICD-10-CM | POA: Diagnosis not present

## 2019-12-27 DIAGNOSIS — D631 Anemia in chronic kidney disease: Secondary | ICD-10-CM | POA: Diagnosis not present

## 2019-12-27 DIAGNOSIS — E875 Hyperkalemia: Secondary | ICD-10-CM | POA: Diagnosis not present

## 2020-02-10 DIAGNOSIS — N184 Chronic kidney disease, stage 4 (severe): Secondary | ICD-10-CM | POA: Diagnosis not present

## 2020-02-10 DIAGNOSIS — I5032 Chronic diastolic (congestive) heart failure: Secondary | ICD-10-CM | POA: Diagnosis not present

## 2020-02-10 DIAGNOSIS — I130 Hypertensive heart and chronic kidney disease with heart failure and stage 1 through stage 4 chronic kidney disease, or unspecified chronic kidney disease: Secondary | ICD-10-CM | POA: Diagnosis not present

## 2020-02-10 DIAGNOSIS — E7800 Pure hypercholesterolemia, unspecified: Secondary | ICD-10-CM | POA: Diagnosis not present

## 2020-02-10 DIAGNOSIS — R7302 Impaired glucose tolerance (oral): Secondary | ICD-10-CM | POA: Diagnosis not present

## 2020-02-10 DIAGNOSIS — D72829 Elevated white blood cell count, unspecified: Secondary | ICD-10-CM | POA: Diagnosis not present

## 2020-02-10 DIAGNOSIS — N2581 Secondary hyperparathyroidism of renal origin: Secondary | ICD-10-CM | POA: Diagnosis not present

## 2020-02-10 DIAGNOSIS — M8949 Other hypertrophic osteoarthropathy, multiple sites: Secondary | ICD-10-CM | POA: Diagnosis not present

## 2020-02-10 DIAGNOSIS — M1A3710 Chronic gout due to renal impairment, right ankle and foot, without tophus (tophi): Secondary | ICD-10-CM | POA: Diagnosis not present

## 2020-02-10 DIAGNOSIS — K219 Gastro-esophageal reflux disease without esophagitis: Secondary | ICD-10-CM | POA: Diagnosis not present

## 2020-02-10 DIAGNOSIS — Z0000 Encounter for general adult medical examination without abnormal findings: Secondary | ICD-10-CM | POA: Diagnosis not present

## 2020-03-05 ENCOUNTER — Encounter: Payer: Self-pay | Admitting: Emergency Medicine

## 2020-03-05 ENCOUNTER — Ambulatory Visit (INDEPENDENT_AMBULATORY_CARE_PROVIDER_SITE_OTHER)
Admission: EM | Admit: 2020-03-05 | Discharge: 2020-03-05 | Disposition: A | Discharge status: Other Acute Inpatient Hospital | Payer: Medicare HMO | Source: Home / Self Care

## 2020-03-05 ENCOUNTER — Emergency Department: Payer: Medicare HMO

## 2020-03-05 ENCOUNTER — Inpatient Hospital Stay
Admission: EM | Admit: 2020-03-05 | Discharge: 2020-03-14 | DRG: 871 | Disposition: A | Discharge status: Home Health | Payer: Medicare HMO | Attending: Internal Medicine | Admitting: Internal Medicine

## 2020-03-05 ENCOUNTER — Other Ambulatory Visit: Payer: Self-pay

## 2020-03-05 ENCOUNTER — Ambulatory Visit (INDEPENDENT_AMBULATORY_CARE_PROVIDER_SITE_OTHER): Payer: Medicare HMO

## 2020-03-05 DIAGNOSIS — A4151 Sepsis due to Escherichia coli [E. coli]: Secondary | ICD-10-CM | POA: Diagnosis not present

## 2020-03-05 DIAGNOSIS — N281 Cyst of kidney, acquired: Secondary | ICD-10-CM | POA: Diagnosis not present

## 2020-03-05 DIAGNOSIS — R112 Nausea with vomiting, unspecified: Secondary | ICD-10-CM

## 2020-03-05 DIAGNOSIS — R197 Diarrhea, unspecified: Secondary | ICD-10-CM

## 2020-03-05 DIAGNOSIS — Z20822 Contact with and (suspected) exposure to covid-19: Secondary | ICD-10-CM | POA: Diagnosis present

## 2020-03-05 DIAGNOSIS — I5033 Acute on chronic diastolic (congestive) heart failure: Secondary | ICD-10-CM | POA: Diagnosis not present

## 2020-03-05 DIAGNOSIS — R059 Cough, unspecified: Secondary | ICD-10-CM

## 2020-03-05 DIAGNOSIS — J449 Chronic obstructive pulmonary disease, unspecified: Secondary | ICD-10-CM | POA: Diagnosis not present

## 2020-03-05 DIAGNOSIS — N184 Chronic kidney disease, stage 4 (severe): Secondary | ICD-10-CM | POA: Diagnosis present

## 2020-03-05 DIAGNOSIS — J029 Acute pharyngitis, unspecified: Secondary | ICD-10-CM | POA: Diagnosis present

## 2020-03-05 DIAGNOSIS — N179 Acute kidney failure, unspecified: Secondary | ICD-10-CM

## 2020-03-05 DIAGNOSIS — E876 Hypokalemia: Secondary | ICD-10-CM | POA: Diagnosis present

## 2020-03-05 DIAGNOSIS — E785 Hyperlipidemia, unspecified: Secondary | ICD-10-CM | POA: Diagnosis not present

## 2020-03-05 DIAGNOSIS — K5732 Diverticulitis of large intestine without perforation or abscess without bleeding: Secondary | ICD-10-CM | POA: Diagnosis present

## 2020-03-05 DIAGNOSIS — I10 Essential (primary) hypertension: Secondary | ICD-10-CM | POA: Diagnosis not present

## 2020-03-05 DIAGNOSIS — Z96659 Presence of unspecified artificial knee joint: Secondary | ICD-10-CM | POA: Diagnosis present

## 2020-03-05 DIAGNOSIS — Z79899 Other long term (current) drug therapy: Secondary | ICD-10-CM | POA: Diagnosis not present

## 2020-03-05 DIAGNOSIS — J441 Chronic obstructive pulmonary disease with (acute) exacerbation: Secondary | ICD-10-CM | POA: Diagnosis not present

## 2020-03-05 DIAGNOSIS — N3000 Acute cystitis without hematuria: Secondary | ICD-10-CM

## 2020-03-05 DIAGNOSIS — M109 Gout, unspecified: Secondary | ICD-10-CM | POA: Diagnosis present

## 2020-03-05 DIAGNOSIS — J9601 Acute respiratory failure with hypoxia: Secondary | ICD-10-CM | POA: Diagnosis not present

## 2020-03-05 DIAGNOSIS — N3001 Acute cystitis with hematuria: Secondary | ICD-10-CM | POA: Insufficient documentation

## 2020-03-05 DIAGNOSIS — K529 Noninfective gastroenteritis and colitis, unspecified: Secondary | ICD-10-CM | POA: Diagnosis present

## 2020-03-05 DIAGNOSIS — I5031 Acute diastolic (congestive) heart failure: Secondary | ICD-10-CM | POA: Diagnosis not present

## 2020-03-05 DIAGNOSIS — R062 Wheezing: Secondary | ICD-10-CM

## 2020-03-05 DIAGNOSIS — I129 Hypertensive chronic kidney disease with stage 1 through stage 4 chronic kidney disease, or unspecified chronic kidney disease: Secondary | ICD-10-CM | POA: Diagnosis not present

## 2020-03-05 DIAGNOSIS — R0689 Other abnormalities of breathing: Secondary | ICD-10-CM | POA: Diagnosis not present

## 2020-03-05 DIAGNOSIS — B9620 Unspecified Escherichia coli [E. coli] as the cause of diseases classified elsewhere: Secondary | ICD-10-CM | POA: Diagnosis not present

## 2020-03-05 DIAGNOSIS — R0902 Hypoxemia: Secondary | ICD-10-CM | POA: Diagnosis not present

## 2020-03-05 DIAGNOSIS — K5730 Diverticulosis of large intestine without perforation or abscess without bleeding: Secondary | ICD-10-CM | POA: Diagnosis not present

## 2020-03-05 DIAGNOSIS — E669 Obesity, unspecified: Secondary | ICD-10-CM | POA: Diagnosis present

## 2020-03-05 DIAGNOSIS — K5900 Constipation, unspecified: Secondary | ICD-10-CM | POA: Diagnosis present

## 2020-03-05 DIAGNOSIS — N390 Urinary tract infection, site not specified: Secondary | ICD-10-CM | POA: Diagnosis not present

## 2020-03-05 DIAGNOSIS — I959 Hypotension, unspecified: Secondary | ICD-10-CM | POA: Diagnosis not present

## 2020-03-05 DIAGNOSIS — K219 Gastro-esophageal reflux disease without esophagitis: Secondary | ICD-10-CM | POA: Diagnosis present

## 2020-03-05 DIAGNOSIS — N189 Chronic kidney disease, unspecified: Secondary | ICD-10-CM | POA: Diagnosis not present

## 2020-03-05 DIAGNOSIS — R1111 Vomiting without nausea: Secondary | ICD-10-CM | POA: Diagnosis not present

## 2020-03-05 DIAGNOSIS — R6520 Severe sepsis without septic shock: Secondary | ICD-10-CM | POA: Diagnosis present

## 2020-03-05 DIAGNOSIS — R0602 Shortness of breath: Secondary | ICD-10-CM

## 2020-03-05 DIAGNOSIS — Z6836 Body mass index (BMI) 36.0-36.9, adult: Secondary | ICD-10-CM

## 2020-03-05 DIAGNOSIS — R7881 Bacteremia: Secondary | ICD-10-CM | POA: Diagnosis not present

## 2020-03-05 DIAGNOSIS — D631 Anemia in chronic kidney disease: Secondary | ICD-10-CM | POA: Diagnosis present

## 2020-03-05 DIAGNOSIS — I130 Hypertensive heart and chronic kidney disease with heart failure and stage 1 through stage 4 chronic kidney disease, or unspecified chronic kidney disease: Secondary | ICD-10-CM | POA: Diagnosis present

## 2020-03-05 DIAGNOSIS — J9811 Atelectasis: Secondary | ICD-10-CM | POA: Diagnosis present

## 2020-03-05 DIAGNOSIS — Z8249 Family history of ischemic heart disease and other diseases of the circulatory system: Secondary | ICD-10-CM

## 2020-03-05 DIAGNOSIS — Z66 Do not resuscitate: Secondary | ICD-10-CM | POA: Diagnosis present

## 2020-03-05 DIAGNOSIS — E860 Dehydration: Secondary | ICD-10-CM | POA: Diagnosis not present

## 2020-03-05 DIAGNOSIS — I509 Heart failure, unspecified: Secondary | ICD-10-CM | POA: Diagnosis not present

## 2020-03-05 DIAGNOSIS — R300 Dysuria: Secondary | ICD-10-CM | POA: Diagnosis not present

## 2020-03-05 DIAGNOSIS — S37009A Unspecified injury of unspecified kidney, initial encounter: Secondary | ICD-10-CM

## 2020-03-05 DIAGNOSIS — I120 Hypertensive chronic kidney disease with stage 5 chronic kidney disease or end stage renal disease: Secondary | ICD-10-CM | POA: Diagnosis not present

## 2020-03-05 HISTORY — DX: Chronic obstructive pulmonary disease, unspecified: J44.9

## 2020-03-05 LAB — CBC WITH DIFFERENTIAL/PLATELET
Abs Immature Granulocytes: 1.16 10*3/uL — ABNORMAL HIGH (ref 0.00–0.07)
Basophils Absolute: 0.1 10*3/uL (ref 0.0–0.1)
Basophils Relative: 0 %
Eosinophils Absolute: 0 10*3/uL (ref 0.0–0.5)
Eosinophils Relative: 0 %
HCT: 37.3 % (ref 36.0–46.0)
Hemoglobin: 12.2 g/dL (ref 12.0–15.0)
Immature Granulocytes: 5 %
Lymphocytes Relative: 4 %
Lymphs Abs: 0.8 10*3/uL (ref 0.7–4.0)
MCH: 29 pg (ref 26.0–34.0)
MCHC: 32.7 g/dL (ref 30.0–36.0)
MCV: 88.8 fL (ref 80.0–100.0)
Monocytes Absolute: 1.6 10*3/uL — ABNORMAL HIGH (ref 0.1–1.0)
Monocytes Relative: 6 %
Neutro Abs: 20.7 10*3/uL — ABNORMAL HIGH (ref 1.7–7.7)
Neutrophils Relative %: 85 %
Platelets: 148 10*3/uL — ABNORMAL LOW (ref 150–400)
RBC: 4.2 MIL/uL (ref 3.87–5.11)
RDW: 14 % (ref 11.5–15.5)
WBC: 24.3 10*3/uL — ABNORMAL HIGH (ref 4.0–10.5)
nRBC: 0 % (ref 0.0–0.2)

## 2020-03-05 LAB — CBC
HCT: 35.1 % — ABNORMAL LOW (ref 36.0–46.0)
Hemoglobin: 11.4 g/dL — ABNORMAL LOW (ref 12.0–15.0)
MCH: 29.1 pg (ref 26.0–34.0)
MCHC: 32.5 g/dL (ref 30.0–36.0)
MCV: 89.5 fL (ref 80.0–100.0)
Platelets: 113 10*3/uL — ABNORMAL LOW (ref 150–400)
RBC: 3.92 MIL/uL (ref 3.87–5.11)
RDW: 14.1 % (ref 11.5–15.5)
WBC: 14.8 10*3/uL — ABNORMAL HIGH (ref 4.0–10.5)
nRBC: 0 % (ref 0.0–0.2)

## 2020-03-05 LAB — COMPREHENSIVE METABOLIC PANEL
ALT: 22 U/L (ref 0–44)
AST: 37 U/L (ref 15–41)
Albumin: 3.2 g/dL — ABNORMAL LOW (ref 3.5–5.0)
Alkaline Phosphatase: 111 U/L (ref 38–126)
Anion gap: 19 — ABNORMAL HIGH (ref 5–15)
BUN: 47 mg/dL — ABNORMAL HIGH (ref 8–23)
CO2: 22 mmol/L (ref 22–32)
Calcium: 8.5 mg/dL — ABNORMAL LOW (ref 8.9–10.3)
Chloride: 93 mmol/L — ABNORMAL LOW (ref 98–111)
Creatinine, Ser: 3.21 mg/dL — ABNORMAL HIGH (ref 0.44–1.00)
GFR, Estimated: 13 mL/min — ABNORMAL LOW (ref >60)
Glucose, Bld: 147 mg/dL — ABNORMAL HIGH (ref 70–99)
Potassium: 3.6 mmol/L (ref 3.5–5.1)
Sodium: 134 mmol/L — ABNORMAL LOW (ref 135–145)
Total Bilirubin: 1.4 mg/dL — ABNORMAL HIGH (ref 0.3–1.2)
Total Protein: 6.3 g/dL — ABNORMAL LOW (ref 6.5–8.1)

## 2020-03-05 LAB — LACTIC ACID, PLASMA: Lactic Acid, Venous: 1.6 mmol/L (ref 0.5–1.9)

## 2020-03-05 LAB — URINALYSIS, COMPLETE (UACMP) WITH MICROSCOPIC
Glucose, UA: 100 mg/dL — AB
Ketones, ur: 15 mg/dL — AB
Nitrite: POSITIVE — AB
Protein, ur: >300 mg/dL — AB
Specific Gravity, Urine: 1.02 (ref 1.005–1.030)
WBC, UA: >50 WBC/hpf (ref 0–5)
pH: 6 (ref 5.0–8.0)

## 2020-03-05 LAB — SARS CORONAVIRUS 2 BY RT PCR (HOSPITAL ORDER, PERFORMED IN ~~LOC~~ HOSPITAL LAB): SARS Coronavirus 2: NEGATIVE

## 2020-03-05 MED ORDER — CALCITRIOL 0.25 MCG PO CAPS
0.2500 ug | ORAL_CAPSULE | Freq: Every day | ORAL | Status: DC
  Administered 2020-03-06 – 2020-03-14 (×9): 0.25 ug via ORAL
  Filled 2020-03-05 (×11): qty 1

## 2020-03-05 MED ORDER — SIMVASTATIN 20 MG PO TABS
40.0000 mg | ORAL_TABLET | Freq: Every day | ORAL | Status: DC
  Administered 2020-03-06 – 2020-03-07 (×2): 40 mg via ORAL
  Filled 2020-03-05 (×3): qty 2

## 2020-03-05 MED ORDER — ALBUTEROL SULFATE (2.5 MG/3ML) 0.083% IN NEBU
2.5000 mg | INHALATION_SOLUTION | RESPIRATORY_TRACT | Status: DC | PRN
  Administered 2020-03-06 (×2): 2.5 mg via RESPIRATORY_TRACT
  Filled 2020-03-05 (×2): qty 3

## 2020-03-05 MED ORDER — LACTATED RINGERS IV SOLN: INTRAVENOUS | Status: DC

## 2020-03-05 MED ORDER — SODIUM CHLORIDE 0.9 % IV BOLUS
250.0000 mL | Freq: Once | INTRAVENOUS | Status: AC
  Administered 2020-03-05: 250 mL via INTRAVENOUS

## 2020-03-05 MED ORDER — ONDANSETRON HCL 4 MG PO TABS: 4.0000 mg | ORAL_TABLET | Freq: Four times a day (QID) | ORAL | Status: DC | PRN

## 2020-03-05 MED ORDER — ACETAMINOPHEN 325 MG PO TABS
650.0000 mg | ORAL_TABLET | Freq: Four times a day (QID) | ORAL | Status: DC | PRN
  Administered 2020-03-06 – 2020-03-07 (×2): 650 mg via ORAL
  Filled 2020-03-05 (×2): qty 2

## 2020-03-05 MED ORDER — ACETAMINOPHEN 650 MG RE SUPP: 650.0000 mg | Freq: Four times a day (QID) | RECTAL | Status: DC | PRN

## 2020-03-05 MED ORDER — HEPARIN SODIUM (PORCINE) 5000 UNIT/ML IJ SOLN
5000.0000 [IU] | Freq: Three times a day (TID) | INTRAMUSCULAR | Status: DC
  Administered 2020-03-06 – 2020-03-14 (×26): 5000 [IU] via SUBCUTANEOUS
  Filled 2020-03-05 (×26): qty 1

## 2020-03-05 MED ORDER — SODIUM CHLORIDE 0.9 % IV BOLUS
1000.0000 mL | Freq: Once | INTRAVENOUS | Status: AC
  Administered 2020-03-05: 1000 mL via INTRAVENOUS

## 2020-03-05 MED ORDER — ONDANSETRON HCL 4 MG/2ML IJ SOLN: 4.0000 mg | Freq: Four times a day (QID) | INTRAMUSCULAR | Status: DC | PRN

## 2020-03-05 MED ORDER — ACETAMINOPHEN 500 MG PO TABS
1000.0000 mg | ORAL_TABLET | Freq: Once | ORAL | Status: AC
  Administered 2020-03-05: 1000 mg via ORAL

## 2020-03-05 MED ORDER — SODIUM CHLORIDE 0.9 % IV SOLN
1.0000 g | INTRAVENOUS | Status: DC
  Administered 2020-03-05: 1 g via INTRAVENOUS
  Filled 2020-03-05 (×2): qty 10

## 2020-03-05 NOTE — Discharge Instructions (Addendum)
Prior discussion I think would best if you were evaluated in the emergency department and possibly mid to the hospital for treatment of your urinary tract infection, shortness of breath, and for your acute renal failure.

## 2020-03-05 NOTE — H&P (Incomplete Revision)
History and Physical    Gloria Day [id number] DOB: 04-28-1928 DOA: 03/05/2020  PCP: Sofie Hartigan, MD  Patient coming from: [facility] I have personally briefly reviewed patient's old medical records in [place]  Chief Complaint:  Dysuria/n/v/d x2-3 days  HPI: Gloria Day is a 87 y.o. female with medical history significant of  CHF Pef, COPD not on home O2 , HTN,CKDIV, gout, Anemia of chronic disease, who presented to UC with complaints of n/v/d/dysuria x 2-3 days with symptoms that are progressive. Per UC notes patient on admit was noted to be dyspneic/tacycpneic w/o hypoxemia. She was noted to also have dried stool on legs due to persistent diarrhea.  ON evaluation patient was noted to have soft bp of 90/60, labs notable for wbc 24/+UA/AKI on CKDIIIa. Due to these findings patient was referred to ed. S/p treatment with 1/2L N/s . ED Course:  Vitals: UC:  Temp 98.2, bp 90/60, rr28, sat 94% on ra hr 91, pain 10 Labs: UA:+nitrate, large LE Wbc:24 plt 148, wbc 12.2 not sig change from prior, increase bands NA:134 (base 138) ,K3.6 cr 3.21 up from 1.45 GfR:13 AG 19 Cxr:1. No acute intrathoracic process. 2. Chronic elevation of the right hemidiaphragm.l ED On arrival  Vitals: 98.9, bp121/87, hr82, rr 30 sat 97% on ra COVID:negative Labs lactic:1.6 CT abd/pelvis:1. No urinary tract calculi or obstructive uropathy. 2. Diverticulosis of the descending and sigmoid colon. Wall thickening of the mid sigmoid colon likely reflects postinflammatory scarring. I do not see any signs of active inflammation on this exam. 3. Known pancreatic cysts, largest in the uncinate process, seen on previous studies are not well visualized on this unenhanced exam. Please refer to MRI discussion 07/19/2019 which recommended 2 year follow-up MRI. 4. Aortic Atherosclerosis (ICD10-I70.0). Tx:CTX, 1L NS Review of Systems: As per HPI otherwise 10 point review of systems negative.    Past Medical History:  Diagnosis Date  . CHF (congestive heart failure) ([place])   . COPD (chronic obstructive pulmonary disease) ([place])   . Hypertension   . Renal insufficiency     Past Surgical History:  Procedure Laterality Date  . ABDOMINAL HYSTERECTOMY    . CHOLECYSTECTOMY    . TONSILLECTOMY    . TOTAL KNEE ARTHROPLASTY       reports that she has never smoked. She has never used smokeless tobacco. She reports that she does not drink alcohol and does not use drugs.  No Known Allergies  Family History  Problem Relation Age of Onset  . Heart attack Mother   . Heart attack Father   . Diabetes Sister   . Skin cancer Brother   . Lung cancer Brother   . Colon cancer Brother   . Bone cancer Brother   . Parkinson's disease Brother   . Heart attack Brother     Prior to Admission medications   Medication Sig Start Date End Date Taking? Authorizing Provider  allopurinol (ZYLOPRIM) 100 MG tablet Take 100 mg by mouth daily.     [provider]  calcitRIOL (ROCALTROL) 0.25 MCG capsule Take 0.25 mcg by mouth daily.     [provider]  clobetasol ointment (TEMOVATE) 0.05 % APPLY A PEA SIZED AMOUNT DAILY FOR 3 WEEKS THEN EVERY OTHER DAY FOR 3 WEEKS THEN TWICE WEEKLY FOR MAINTENANCE. 10/21/17   [provider]  diltiazem (CARDIZEM CD) 180 MG 24 hr capsule Take 180 mg by mouth daily.  11/29/13   [provider]  furosemide (LASIX)  20 MG tablet Take 40 mg by mouth daily.     [provider]  hydrocortisone 2.5 % cream APPLY CREAM TWICE DAILY AFTER BM 12/03/17   [provider]  methylcellulose oral powder Take by mouth daily.     [provider]  metoprolol tartrate (LOPRESSOR) 50 MG tablet Take by mouth. 11/29/13   [provider]  Multiple Vitamins-Minerals (PRESERVISION AREDS 2+MULTI VIT PO) Take by mouth.    [provider]  pantoprazole (PROTONIX) 40 MG tablet Take 40 mg by mouth daily.    [provider]  Polyethyl Glycol-Propyl Glycol 0.4-0.3 % SOLN Apply to eye.     [provider]  Polyethylene Glycol 3350 (PEG 3350) POWD Take by mouth.    [provider]  Probiotic Product ([employer] PROBIOTIC MAXIMUM STRENGTH) CAPS Take by mouth.     [provider]  RABEprazole (ACIPHEX) 20 MG tablet Take 20 mg by mouth daily. 12/08/17   [provider]  simvastatin (ZOCOR) 40 MG tablet TAKE ONE TABLET BY MOUTH ONCE NIGHTLY 02/02/17   [provider]  vitamin B-12 (CYANOCOBALAMIN) 1000 MCG tablet Take 1,000 mcg by mouth daily.     [provider]  ferrous sulfate 325 (65 FE) MG tablet Take 325 mg by mouth daily with breakfast.   03/05/20  [provider]    Physical Exam: Vitals:   03/05/20 1835 03/05/20 1900 03/05/20 2000 03/05/20 2030  BP: 131/63 (!) 140/59 (!) 146/84 (!) 133/94  Pulse: 85 85 89 93  Resp: (!) 28 (!) 32 (!) 21 (!) 21  Temp:      TempSrc:      SpO2: 96% 94% 92% 96%  Weight:      Height:         Vitals:   03/05/20 1835 03/05/20 1900 03/05/20 2000 03/05/20 2030  BP: 131/63 (!) 140/59 (!) 146/84 (!) 133/94  Pulse: 85 85 89 93  Resp: (!) 28 (!) 32 (!) 21 (!) 21  Temp:      TempSrc:      SpO2: 96% 94% 92% 96%  Weight:      Height:      Constitutional: NAD, calm, comfortable Eyes: PERRL, lids and conjunctivae normal ENMT: Mucous membranes are moist. Posterior pharynx clear of any exudate or lesions.Normal dentition.  Neck: normal, supple, no masses, no thyromegaly Respiratory: clear to auscultation bilaterally, no wheezing, no crackles. Normal respiratory effort. No accessory muscle use.  Cardiovascular: Regular rate and rhythm, no murmurs / rubs / gallops. No extremity edema. 2+ pedal pulses. No carotid bruits.  Abdomen: no tenderness, no masses palpated. No hepatosplenomegaly. Bowel sounds positive.  Musculoskeletal: no clubbing / cyanosis. No joint deformity upper and lower extremities. Good ROM, no  contractures. Normal muscle tone.  Skin: no rashes, lesions, ulcers. No induration Neurologic: CN 2-12 grossly intact. Sensation intact, DTR normal. Strength 5/5 in all 4.  Psychiatric: Normal judgment and insight. Alert and oriented x 3. Normal mood.    Labs on Admission: I have personally reviewed following labs and imaging studies  CBC: Recent Labs  Lab 03/05/20 1447  WBC 24.3*  NEUTROABS 20.7*  HGB 12.2  HCT 37.3  MCV 88.8  PLT 123456*   Basic Metabolic Panel: Recent Labs  Lab 03/05/20 1447  NA 134*  K 3.6  CL 93*  CO2 22  GLUCOSE 147*  BUN 47*  CREATININE 3.21*  CALCIUM 8.5*   GFR: Estimated Creatinine Clearance: 13.3 mL/min (A) (by C-G formula based on  SCr of 3.21 mg/dL (H)). Liver Function Tests: Recent Labs  Lab 03/05/20 1447  AST 37  ALT 22  ALKPHOS 111  BILITOT 1.4*  PROT 6.3*  ALBUMIN 3.2*   No results for input(s): LIPASE, AMYLASE in the last 168 hours. No results for input(s): AMMONIA in the last 168 hours. Coagulation Profile: No results for input(s): INR, PROTIME in the last 168 hours. Cardiac Enzymes: No results for input(s): CKTOTAL, CKMB, CKMBINDEX, TROPONINI in the last 168 hours. BNP (last 3 results) No results for input(s): PROBNP in the last 8760 hours. HbA1C: No results for input(s): HGBA1C in the last 72 hours. CBG: No results for input(s): GLUCAP in the last 168 hours. Lipid Profile: No results for input(s): CHOL, HDL, LDLCALC, TRIG, CHOLHDL, LDLDIRECT in the last 72 hours. Thyroid Function Tests: No results for input(s): TSH, T4TOTAL, FREET4, T3FREE, THYROIDAB in the last 72 hours. Anemia Panel: No results for input(s): VITAMINB12, FOLATE, FERRITIN, TIBC, IRON, RETICCTPCT in the last 72 hours. Urine analysis:    Component Value Date/Time   COLORURINE ORANGE (A) 03/05/2020 1409   APPEARANCEUR CLOUDY (A) 03/05/2020 1409   APPEARANCEUR Clear 04/29/2013 1815   LABSPEC 1.020 03/05/2020 1409   LABSPEC 1.006 04/29/2013 1815    PHURINE 6.0 03/05/2020 1409   GLUCOSEU 100 (A) 03/05/2020 1409   GLUCOSEU Negative 04/29/2013 1815   HGBUR LARGE (A) 03/05/2020 1409   BILIRUBINUR MODERATE (A) 03/05/2020 1409   BILIRUBINUR Negative 04/29/2013 1815   KETONESUR 15 (A) 03/05/2020 1409   PROTEINUR >300 (A) 03/05/2020 1409   NITRITE POSITIVE (A) 03/05/2020 1409   LEUKOCYTESUR LARGE (A) 03/05/2020 1409   LEUKOCYTESUR Negative 04/29/2013 1815    Radiological Exams on Admission: CT ABDOMEN PELVIS WO CONTRAST  Result Date: 03/05/2020 CLINICAL DATA:  Nausea/vomiting/diarrhea, pain for several days EXAM: CT ABDOMEN AND PELVIS WITHOUT CONTRAST TECHNIQUE: Multidetector CT imaging of the abdomen and pelvis was performed following the standard protocol without IV contrast. COMPARISON:  07/27/2017, 07/19/2019 FINDINGS: Lower chest: No acute pleural or parenchymal lung disease. Unenhanced CT was performed per clinician order. Lack of IV contrast limits sensitivity and specificity, especially for evaluation of abdominal/pelvic solid viscera. Hepatobiliary: No focal liver abnormality is seen. Status post cholecystectomy. No biliary dilatation. Pancreas: Unremarkable. No pancreatic ductal dilatation or surrounding inflammatory changes. Known pancreatic cysts, largest in the uncinate process, seen on previous studies are not well visualized on this unenhanced exam. Spleen: Normal in size without focal abnormality. Adrenals/Urinary Tract: Stable bilateral adrenal glands. Exophytic cyst right kidney unchanged. No urinary tract calculi or obstructive uropathy within either kidney. The bladder is decompressed, which limits its evaluation. Stomach/Bowel: No bowel obstruction or ileus. There is diverticulosis of the descending and sigmoid colon. Wall thickening of the mid sigmoid colon likely reflects postinflammatory scarring. I do not see any signs of active inflammation on this exam. Vascular/Lymphatic: Stable atherosclerosis of the abdominal aorta and its  branches. Multiple borderline enlarged retroperitoneal lymph nodes are unchanged and likely reactive. Largest measures 10 mm in the aortocaval region. Reproductive: Status post hysterectomy. No adnexal masses. Other: No free fluid or free gas. Small fat containing umbilical hernia. Musculoskeletal: No acute or destructive bony lesions. Reconstructed images demonstrate no additional findings. IMPRESSION: 1. No urinary tract calculi or obstructive uropathy. 2. Diverticulosis of the descending and sigmoid colon. Wall thickening of the mid sigmoid colon likely reflects postinflammatory scarring. I do not see any signs of active inflammation on this exam. 3. Known pancreatic cysts, largest in the uncinate process, seen on previous  studies are not well visualized on this unenhanced exam. Please refer to MRI discussion 07/19/2019 which recommended 2 year follow-up MRI. 4. Aortic Atherosclerosis (ICD10-I70.0). Electronically Signed   By: Randa Ngo M.D.   On: 03/05/2020 17:47   DG Chest 2 View  Result Date: 03/05/2020 CLINICAL DATA:  Short of breath, wheezing EXAM: CHEST - 2 VIEW COMPARISON:  04/29/2013 FINDINGS: Frontal and lateral views of the chest demonstrate a stable cardiac silhouette. Chronic elevation the right hemidiaphragm. No airspace disease, effusion, or pneumothorax. No acute bony abnormalities. IMPRESSION: 1. No acute intrathoracic process. 2. Chronic elevation of the right hemidiaphragm. Electronically Signed   By: Randa Ngo M.D.   On: 03/05/2020 15:35    EKG: Independently reviewed. n/a  Assessment/Plan UTI with sepsis -place on CTX  -ivfs  -f/u on blood cultures  -strict I/o   Gastroenteritis  -supportive care  -clear liquids advance as tolerated  -ct abd/pelvis no acute findings  -lactic currently within normal limits  -gi pcr stool pending   AKI on CKDIV -due to dehydration related to persistent diarrhea/uti insetting of lasix use -ivfs  -hold nephrotoxic medications   -strict I/o   CHF Pef -no acute excerbation -due to soft bp , holding lasix, ccb, metoprolol -resume as able   COPD -no acute exacerbation  -no on controller medications   HTN -borderline holding outpatient medication resume as able   HLD -continue statin   Gout -no current flare   GERD -ppi  DVT prophylaxis: heparin Code Status: Full Family Communication: no family at bedside  Disposition Plan:patient  expected to be admitted greater than 2 midnights Consults called:n/a Admission status:inpatient   Clance Boll MD [facility]   If 7PM-7AM, please contact night-coverage [email or web address] Password [email or web address]  03/05/2020, 9:22 PM

## 2020-03-05 NOTE — ED Provider Notes (Addendum)
[facility]    CSN: [id number] Arrival date & time: 03/05/20  1345      History   Chief Complaint Chief Complaint  Patient presents with  . Dysuria  . Diarrhea  . Emesis    HPI Gloria Day is a 87 y.o. female.   HPI   87 year old female here for evaluation of multiple complaints.  Patient is dyspneic, tachypneic, and audibly wheezing upon entering the exam room.  Patient and caregiver say that this is normal for her.  Patient is a history of COPD and was on 2 inhalers but she stopped taking them because she said they did not do any good.  Patient's more recent complaint is that 3 days ago she developed painful urination, increased frequency of urination, and urinary incontinence.  2 days ago she developed nausea, vomiting, and diarrhea.  Patient reports that whenever she drinks fluids she stands up and the diarrhea starts.  Patient has tried diarrhea streaks down both legs.  Patient and caregiver unsure if patient has had a fever or not.  Past Medical History:  Diagnosis Date  . CHF (congestive heart failure) ([place])   . COPD (chronic obstructive pulmonary disease) ([place])   . Hypertension   . Renal insufficiency     Patient Active Problem List   Diagnosis Date Noted  . Leukocytosis 12/18/2017    Past Surgical History:  Procedure Laterality Date  . ABDOMINAL HYSTERECTOMY    . CHOLECYSTECTOMY    . TONSILLECTOMY    . TOTAL KNEE ARTHROPLASTY      OB History   No obstetric history on file.      Home Medications    Prior to Admission medications   Medication Sig Start Date End Date Taking? Authorizing Provider  allopurinol (ZYLOPRIM) 100 MG tablet Take 100 mg by mouth daily.    Yes [provider]  calcitRIOL (ROCALTROL) 0.25 MCG capsule Take 0.25 mcg by mouth daily.    Yes [provider]  clobetasol ointment (TEMOVATE) 0.05 % APPLY A PEA SIZED AMOUNT DAILY FOR 3 WEEKS THEN EVERY OTHER DAY FOR 3 WEEKS THEN TWICE WEEKLY FOR  MAINTENANCE. 10/21/17  Yes [provider]  diltiazem (CARDIZEM CD) 180 MG 24 hr capsule Take 180 mg by mouth daily.  11/29/13  Yes [provider]  furosemide (LASIX) 20 MG tablet Take 40 mg by mouth daily.    Yes [provider]  hydrocortisone 2.5 % cream APPLY CREAM TWICE DAILY AFTER BM 12/03/17  Yes [provider]  methylcellulose oral powder Take by mouth daily.    Yes [provider]  metoprolol tartrate (LOPRESSOR) 50 MG tablet Take by mouth. 11/29/13  Yes [provider]  Multiple Vitamins-Minerals (PRESERVISION AREDS 2+MULTI VIT PO) Take by mouth.   Yes [provider]  Polyethyl Glycol-Propyl Glycol 0.4-0.3 % SOLN Apply to eye.    Yes [provider]  Polyethylene Glycol 3350 (PEG 3350) POWD Take by mouth.   Yes [provider]  Probiotic Product ([employer] PROBIOTIC MAXIMUM STRENGTH) CAPS Take by mouth.    Yes [provider]  simvastatin (ZOCOR) 40 MG tablet TAKE ONE TABLET BY MOUTH ONCE NIGHTLY 02/02/17  Yes [provider]  vitamin B-12 (CYANOCOBALAMIN) 1000 MCG tablet Take 1,000 mcg by mouth daily.    Yes [provider]  pantoprazole (PROTONIX) 40 MG tablet Take 40 mg by mouth daily.    [provider]  RABEprazole (ACIPHEX) 20 MG tablet Take 20 mg by mouth daily.  12/08/17   [provider]  ferrous sulfate 325 (65 FE) MG tablet Take 325 mg by mouth daily with breakfast.   03/05/20  [provider]    Family History Family History  Problem Relation Age of Onset  . Heart attack Mother   . Heart attack Father   . Diabetes Sister   . Skin cancer Brother   . Lung cancer Brother   . Colon cancer Brother   . Bone cancer Brother   . Parkinson's disease Brother   . Heart attack Brother     Social History Social History   Tobacco Use  . Smoking status: Never Smoker  . Smokeless tobacco: Never Used  Vaping Use  . Vaping Use: Never used  Substance Use  Topics  . Alcohol use: Never  . Drug use: Never     Allergies   Patient has no known allergies.   Review of Systems Review of Systems  Constitutional: Negative for activity change, appetite change and fever.  Respiratory: Positive for shortness of breath and wheezing.   Gastrointestinal: Positive for diarrhea, nausea and vomiting.  Genitourinary: Positive for dysuria, frequency and urgency. Negative for hematuria.  Skin: Negative for rash and wound.  Neurological: Negative for syncope.  Hematological: Negative.   Psychiatric/Behavioral: Negative.      Physical Exam Triage Vital Signs ED Triage Vitals  Enc Vitals Group     BP 03/05/20 1359 90/60     Pulse Rate 03/05/20 1359 91     Resp 03/05/20 1359 (!) 28     Temp 03/05/20 1359 98.2 F (36.8 C)     Temp Source 03/05/20 1359 Oral     SpO2 03/05/20 1359 94 %     Weight 03/05/20 1359 210 lb (95.3 kg)     Height 03/05/20 1359 '5\' 4"'$  (1.626 m)     Head Circumference --      Peak Flow --      Pain Score 03/05/20 1358 10     Pain Loc --      Pain Edu? --      Excl. in [place]? --    No data found.  Updated Vital Signs BP 90/60 (BP Location: Left Arm)   Pulse 91   Temp 98.2 F (36.8 C) (Oral)   Resp (!) 28   Ht '5\' 4"'$  (1.626 m)   Wt 210 lb (95.3 kg)   SpO2 94%   BMI 36.05 kg/m   Visual Acuity Right Eye Distance:   Left Eye Distance:   Bilateral Distance:    Right Eye Near:   Left Eye Near:    Bilateral Near:     Physical Exam Vitals and nursing note reviewed.  Constitutional:      General: She is in acute distress.     Appearance: She is ill-appearing.  HENT:     Head: Normocephalic.  Cardiovascular:     Rate and Rhythm: Normal rate and regular rhythm.     Heart sounds: Normal heart sounds. No murmur heard. No gallop.   Pulmonary:     Effort: Respiratory distress present.     Breath sounds: Wheezing present. No rhonchi or rales.  Abdominal:     General: Bowel sounds are normal.     Palpations:  Abdomen is soft.     Tenderness: There is abdominal tenderness. There is no guarding or rebound.     Hernia: No hernia is present.  Skin:    General: Skin is warm and dry.  Capillary Refill: Capillary refill takes 2 to 3 seconds.     Coloration: Skin is pale.  Neurological:     Mental Status: She is alert and oriented to person, place, and time.  Psychiatric:        Mood and Affect: Mood normal.        Behavior: Behavior normal.        Thought Content: Thought content normal.        Judgment: Judgment normal.      UC Treatments / Results  Labs (all labs ordered are listed, but only abnormal results are displayed) Labs Reviewed  URINALYSIS, COMPLETE (UACMP) WITH MICROSCOPIC - Abnormal; Notable for the following components:      Result Value   Color, Urine ORANGE (*)    APPearance CLOUDY (*)    Glucose, UA 100 (*)    Hgb urine dipstick LARGE (*)    Bilirubin Urine MODERATE (*)    Ketones, ur 15 (*)    Protein, ur >300 (*)    Nitrite POSITIVE (*)    Leukocytes,Ua LARGE (*)    Bacteria, UA MANY (*)    All other components within normal limits  CBC WITH DIFFERENTIAL/PLATELET - Abnormal; Notable for the following components:   WBC 24.3 (*)    Platelets 148 (*)    Neutro Abs 20.7 (*)    Monocytes Absolute 1.6 (*)    Abs Immature Granulocytes 1.16 (*)    All other components within normal limits  COMPREHENSIVE METABOLIC PANEL - Abnormal; Notable for the following components:   Sodium 134 (*)    Chloride 93 (*)    Glucose, Bld 147 (*)    BUN 47 (*)    Creatinine, Ser 3.21 (*)    Calcium 8.5 (*)    Total Protein 6.3 (*)    Albumin 3.2 (*)    Total Bilirubin 1.4 (*)    GFR, Estimated 13 (*)    Anion gap 19 (*)    All other components within normal limits  URINE CULTURE    EKG   Radiology DG Chest 2 View  Result Date: 03/05/2020 CLINICAL DATA:  Short of breath, wheezing EXAM: CHEST - 2 VIEW COMPARISON:  04/29/2013 FINDINGS: Frontal and lateral views of the chest  demonstrate a stable cardiac silhouette. Chronic elevation the right hemidiaphragm. No airspace disease, effusion, or pneumothorax. No acute bony abnormalities. IMPRESSION: 1. No acute intrathoracic process. 2. Chronic elevation of the right hemidiaphragm. Electronically Signed   By: Randa Ngo M.D.   On: 03/05/2020 15:35    Procedures Procedures (including critical care time)  Medications Ordered in UC Medications  sodium chloride 0.9 % bolus 250 mL (0 mLs Intravenous Stopped 03/05/20 1507)    Initial Impression / Assessment and Plan / UC Course  I have reviewed the triage vital signs and the nursing notes.  Pertinent labs & imaging results that were available during my care of the patient were reviewed by me and considered in my medical decision making (see chart for details).   Patient is a ill-appearing 87 year old female here for evaluation of multiple complaints.  Patient has audible wheezing and she is tachypneic and dyspneic upon entering the exam room.  Patient and caregiver say that this is normal for the patient.  Physical exam reveals decreased lung sounds throughout.  Heart sounds are S1-S2.  Patient skin is pale and she has decreased skin turgor.  Peripheral pulses are 1+ globally.  Cap refill is 2 to 3 seconds.  Patient is  complaining of generalized abdominal tenderness.  Patient's abdomen is soft, nondistended, with positive bowel sounds in all 4 quadrants.  Patient does have mild tenderness in the left lower quadrant to palpation.  Will obtain CBC, CMP, UA, and chest x-ray.  Will give patient 250 mL fluid bolus via IV.  CBC shows an elevated white blood cell count of 24.3 with a 20.7 neutrophil count.  Platelets are 148.  CMP shows mild hyponatremia at 134, BUN 47, creatinine 3.21 interval change from labs drawn 02/10/2020 where patient's BUN was 21 and creatinine was 1.6.  Urinalysis shows 100 glucose, large blood, moderate bilirubin, greater than 300 protein, nitrite  positive, large leukocytes, greater than 50 WBCs, and many bacteria.  Will send urine for culture.  Chest x-ray independently read by me.  Interpretation: Patient has a large pleural effusion on the right.  There are no other pulmonary anomalies.  Awaiting radiology overread.  Theology's reading is a chronic right hemidiaphragm elevation.  Discussed findings with the patient and suggested that she needs to be evaluated in the ER and admitted for treatment of her acute renal failure, her UTI, and this possible pleural effusion.  Patient is agreeable.  EMS called for transport.  Report was called to [facility] the charge nurse in the ER at [facility].  Report given to paramedic for [facility] and care transferred.  Final Clinical Impressions(s) / UC Diagnoses   Final diagnoses:  Acute cystitis with hematuria  Acute renal failure, unspecified acute renal failure type (HCC)  Nausea vomiting and diarrhea     Discharge Instructions     Prior discussion I think would best if you were evaluated in the emergency department and possibly mid to the hospital for treatment of your urinary tract infection, shortness of breath, and for your acute renal failure.    ED Prescriptions    None     PDMP not reviewed this encounter.   Margarette Canada, NP 03/05/20 1558    Margarette Canada, NP 03/05/20 1558

## 2020-03-05 NOTE — H&P (Addendum)
History and Physical    Gloria Day [id number] DOB: 1928-11-11 DOA: 03/05/2020  PCP: Sofie Hartigan, MD  Patient coming from: Urgent care [place] I have personally briefly reviewed patient's old medical records in [place]  Chief Complaint:  Dysuria/n/v/d x2-3 days  HPI: Gloria Day is a 87 y.o. female with medical history significant of  CHF Pef, COPD not on home O2 , HTN,CKDIV, gout, Anemia of chronic disease, who presented to UC with complaints of n/v/d/dysuria x 2-3 days with symptoms that are progressive. Per UC notes patient on admit was noted to be dyspneic/tacycpneic w/o hypoxemia. She was noted to also have dried stool on legs due to persistent diarrhea.  ON evaluation patient was noted to have soft bp of 90/60, labs notable for wbc 24/+UA/AKI on CKDIIIa. Due to these findings patient was referred to ed. S/p treatment with 1/2L N/s .Patient currently notes that since being ill with uti she has also note increase sob from baseline, she notes that she was on controller medications for COPD but felt they were not effective and decide to discontinue these medications. She notes that her pcp is aware of this. In any event patient notes her diarrhea has slowed down, but noted she has bodyaches all over. She noted no chest pain but note chronic RUQ pain she notes she has had for years. She denies any significant cough, she denies any blood in her stools.  ED Course:  Vitals: UC:  Temp 98.2, bp 90/60, rr28, sat 94% on ra hr 91, pain 10 Labs: UA:+nitrate, large LE Wbc:24 plt 148, wbc 12.2 not sig change from prior, increase bands NA:134 (base 138) ,K3.6 cr 3.21 up from 1.45 GfR:13 AG 19 Cxr:1. No acute intrathoracic process. 2. Chronic elevation of the right hemidiaphragm.l ED On arrival  Vitals: 98.9, bp121/87, hr82, rr 30 sat 97% on ra COVID:negative Labs lactic:1.6 CT abd/pelvis:1. No urinary tract calculi or obstructive uropathy. 2. Diverticulosis of the descending  and sigmoid colon. Wall thickening of the mid sigmoid colon likely reflects postinflammatory scarring. I do not see any signs of active inflammation on this exam. 3. Known pancreatic cysts, largest in the uncinate process, seen on previous studies are not well visualized on this unenhanced exam. Please refer to MRI discussion 07/19/2019 which recommended 2 year follow-up MRI. 4. Aortic Atherosclerosis (ICD10-I70.0). Tx:CTX, 1L NS Review of Systems: As per HPI otherwise 10 point review of systems negative.   Past Medical History:  Diagnosis Date  . CHF (congestive heart failure) ([place])   . COPD (chronic obstructive pulmonary disease) ([place])   . Hypertension   . Renal insufficiency     Past Surgical History:  Procedure Laterality Date  . ABDOMINAL HYSTERECTOMY    . CHOLECYSTECTOMY    . TONSILLECTOMY    . TOTAL KNEE ARTHROPLASTY       reports that she has never smoked. She has never used smokeless tobacco. She reports that she does not drink alcohol and does not use drugs.  No Known Allergies  Family History  Problem Relation Age of Onset  . Heart attack Mother   . Heart attack Father   . Diabetes Sister   . Skin cancer Brother   . Lung cancer Brother   . Colon cancer Brother   . Bone cancer Brother   . Parkinson's disease Brother   . Heart attack Brother     Prior to Admission medications   Medication Sig Start Date End Date Taking? Authorizing Provider  allopurinol (  ZYLOPRIM) 100 MG tablet Take 100 mg by mouth daily.     [provider]  calcitRIOL (ROCALTROL) 0.25 MCG capsule Take 0.25 mcg by mouth daily.     [provider]  clobetasol ointment (TEMOVATE) 0.05 % APPLY A PEA SIZED AMOUNT DAILY FOR 3 WEEKS THEN EVERY OTHER DAY FOR 3 WEEKS THEN TWICE WEEKLY FOR MAINTENANCE. 10/21/17   [provider]  diltiazem (CARDIZEM CD) 180 MG 24 hr capsule Take 180 mg by mouth daily.  11/29/13   [provider]  furosemide (LASIX) 20 MG tablet  Take 40 mg by mouth daily.     [provider]  hydrocortisone 2.5 % cream APPLY CREAM TWICE DAILY AFTER BM 12/03/17   [provider]  methylcellulose oral powder Take by mouth daily.     [provider]  metoprolol tartrate (LOPRESSOR) 50 MG tablet Take by mouth. 11/29/13   [provider]  Multiple Vitamins-Minerals (PRESERVISION AREDS 2+MULTI VIT PO) Take by mouth.    [provider]  pantoprazole (PROTONIX) 40 MG tablet Take 40 mg by mouth daily.    [provider]  Polyethyl Glycol-Propyl Glycol 0.4-0.3 % SOLN Apply to eye.     [provider]  Polyethylene Glycol 3350 (PEG 3350) POWD Take by mouth.    [provider]  Probiotic Product ([employer] PROBIOTIC MAXIMUM STRENGTH) CAPS Take by mouth.     [provider]  RABEprazole (ACIPHEX) 20 MG tablet Take 20 mg by mouth daily. 12/08/17   [provider]  simvastatin (ZOCOR) 40 MG tablet TAKE ONE TABLET BY MOUTH ONCE NIGHTLY 02/02/17   [provider]  vitamin B-12 (CYANOCOBALAMIN) 1000 MCG tablet Take 1,000 mcg by mouth daily.     [provider]  ferrous sulfate 325 (65 FE) MG tablet Take 325 mg by mouth daily with breakfast.   03/05/20  [provider]    Physical Exam: Vitals:   03/05/20 1835 03/05/20 1900 03/05/20 2000 03/05/20 2030  BP: 131/63 (!) 140/59 (!) 146/84 (!) 133/94  Pulse: 85 85 89 93  Resp: (!) 28 (!) 32 (!) 21 (!) 21  Temp:      TempSrc:      SpO2: 96% 94% 92% 96%  Weight:      Height:         Vitals:   03/05/20 1835 03/05/20 1900 03/05/20 2000 03/05/20 2030  BP: 131/63 (!) 140/59 (!) 146/84 (!) 133/94  Pulse: 85 85 89 93  Resp: (!) 28 (!) 32 (!) 21 (!) 21  Temp:      TempSrc:      SpO2: 96% 94% 92% 96%  Weight:      Height:      Constitutional: NAD, calm,  Noted increase wob intermittently, no conversation DOE Eyes: PERRL, lids and conjunctivae normal ENMT: Mucous membranes are moist. Posterior  pharynx clear of any exudate or lesions.Normal dentition.  Neck: normal, supple, no masses, no thyromegaly Respiratory: clear to auscultation bilaterally, no wheezing, no crackles. Intermittent increase  respiratory effort. No accessory muscle use.  Cardiovascular: Regular rate and rhythm,+ murmurs no rubs / gallops. No extremity edema. 2+ pedal pulses.  Abdomen: no tenderness, no masses palpated. No hepatosplenomegaly. Bowel sounds positive.  Musculoskeletal: no clubbing / cyanosis. No joint deformity upper and lower extremities. Good ROM, no contractures. Normal muscle tone.  Skin: no rashes, lesions, ulcers. No induration Neurologic: CN 2-12 grossly intact. Sensation intact. Strength 5/5 in all 4.  Psychiatric: Normal judgment and insight.  Alert and oriented x 3. Normal mood.    Labs on Admission: I have personally reviewed following labs and imaging studies  CBC: Recent Labs  Lab 03/05/20 1447  WBC 24.3*  NEUTROABS 20.7*  HGB 12.2  HCT 37.3  MCV 88.8  PLT 123456*   Basic Metabolic Panel: Recent Labs  Lab 03/05/20 1447  NA 134*  K 3.6  CL 93*  CO2 22  GLUCOSE 147*  BUN 47*  CREATININE 3.21*  CALCIUM 8.5*   GFR: Estimated Creatinine Clearance: 13.3 mL/min (A) (by C-G formula based on SCr of 3.21 mg/dL (H)). Liver Function Tests: Recent Labs  Lab 03/05/20 1447  AST 37  ALT 22  ALKPHOS 111  BILITOT 1.4*  PROT 6.3*  ALBUMIN 3.2*   No results for input(s): LIPASE, AMYLASE in the last 168 hours. No results for input(s): AMMONIA in the last 168 hours. Coagulation Profile: No results for input(s): INR, PROTIME in the last 168 hours. Cardiac Enzymes: No results for input(s): CKTOTAL, CKMB, CKMBINDEX, TROPONINI in the last 168 hours. BNP (last 3 results) No results for input(s): PROBNP in the last 8760 hours. HbA1C: No results for input(s): HGBA1C in the last 72 hours. CBG: No results for input(s): GLUCAP in the last 168 hours. Lipid Profile: No results for  input(s): CHOL, HDL, LDLCALC, TRIG, CHOLHDL, LDLDIRECT in the last 72 hours. Thyroid Function Tests: No results for input(s): TSH, T4TOTAL, FREET4, T3FREE, THYROIDAB in the last 72 hours. Anemia Panel: No results for input(s): VITAMINB12, FOLATE, FERRITIN, TIBC, IRON, RETICCTPCT in the last 72 hours. Urine analysis:    Component Value Date/Time   COLORURINE ORANGE (A) 03/05/2020 1409   APPEARANCEUR CLOUDY (A) 03/05/2020 1409   APPEARANCEUR Clear 04/29/2013 1815   LABSPEC 1.020 03/05/2020 1409   LABSPEC 1.006 04/29/2013 1815   PHURINE 6.0 03/05/2020 1409   GLUCOSEU 100 (A) 03/05/2020 1409   GLUCOSEU Negative 04/29/2013 1815   HGBUR LARGE (A) 03/05/2020 1409   BILIRUBINUR MODERATE (A) 03/05/2020 1409   BILIRUBINUR Negative 04/29/2013 1815   KETONESUR 15 (A) 03/05/2020 1409   PROTEINUR >300 (A) 03/05/2020 1409   NITRITE POSITIVE (A) 03/05/2020 1409   LEUKOCYTESUR LARGE (A) 03/05/2020 1409   LEUKOCYTESUR Negative 04/29/2013 1815    Radiological Exams on Admission: CT ABDOMEN PELVIS WO CONTRAST  Result Date: 03/05/2020 CLINICAL DATA:  Nausea/vomiting/diarrhea, pain for several days EXAM: CT ABDOMEN AND PELVIS WITHOUT CONTRAST TECHNIQUE: Multidetector CT imaging of the abdomen and pelvis was performed following the standard protocol without IV contrast. COMPARISON:  07/27/2017, 07/19/2019 FINDINGS: Lower chest: No acute pleural or parenchymal lung disease. Unenhanced CT was performed per clinician order. Lack of IV contrast limits sensitivity and specificity, especially for evaluation of abdominal/pelvic solid viscera. Hepatobiliary: No focal liver abnormality is seen. Status post cholecystectomy. No biliary dilatation. Pancreas: Unremarkable. No pancreatic ductal dilatation or surrounding inflammatory changes. Known pancreatic cysts, largest in the uncinate process, seen on previous studies are not well visualized on this unenhanced exam. Spleen: Normal in size without focal abnormality.  Adrenals/Urinary Tract: Stable bilateral adrenal glands. Exophytic cyst right kidney unchanged. No urinary tract calculi or obstructive uropathy within either kidney. The bladder is decompressed, which limits its evaluation. Stomach/Bowel: No bowel obstruction or ileus. There is diverticulosis of the descending and sigmoid colon. Wall thickening of the mid sigmoid colon likely reflects postinflammatory scarring. I do not see any signs of active inflammation on this exam. Vascular/Lymphatic: Stable atherosclerosis of the abdominal aorta and its branches. Multiple borderline enlarged retroperitoneal lymph nodes  are unchanged and likely reactive. Largest measures 10 mm in the aortocaval region. Reproductive: Status post hysterectomy. No adnexal masses. Other: No free fluid or free gas. Small fat containing umbilical hernia. Musculoskeletal: No acute or destructive bony lesions. Reconstructed images demonstrate no additional findings. IMPRESSION: 1. No urinary tract calculi or obstructive uropathy. 2. Diverticulosis of the descending and sigmoid colon. Wall thickening of the mid sigmoid colon likely reflects postinflammatory scarring. I do not see any signs of active inflammation on this exam. 3. Known pancreatic cysts, largest in the uncinate process, seen on previous studies are not well visualized on this unenhanced exam. Please refer to MRI discussion 07/19/2019 which recommended 2 year follow-up MRI. 4. Aortic Atherosclerosis (ICD10-I70.0). Electronically Signed   By: Randa Ngo M.D.   On: 03/05/2020 17:47   DG Chest 2 View  Result Date: 03/05/2020 CLINICAL DATA:  Short of breath, wheezing EXAM: CHEST - 2 VIEW COMPARISON:  04/29/2013 FINDINGS: Frontal and lateral views of the chest demonstrate a stable cardiac silhouette. Chronic elevation the right hemidiaphragm. No airspace disease, effusion, or pneumothorax. No acute bony abnormalities. IMPRESSION: 1. No acute intrathoracic process. 2. Chronic elevation  of the right hemidiaphragm. Electronically Signed   By: Randa Ngo M.D.   On: 03/05/2020 15:35    EKG: Independently reviewed. n/a  Assessment/Plan UTI with sepsis -place on CTX  -ivfs  -f/u on blood cultures  -strict I/o   Gastroenteritis  -supportive care  -clear liquids advance as tolerated  -ct abd/pelvis no acute findings  -lactic currently within normal limits  -gi pcr stool pending   AKI on CKDIV -due to dehydration related to persistent diarrhea/uti insetting of lasix use -ivfs  -hold nephrotoxic medications  -strict I/o   CHF Pef -no acute excerbation -due to soft bp , holding lasix, ccb, metoprolol -resume as able   COPD -no acute exacerbation  -not on controller medications  -no noted hypoxemia , on ra with sat 92-mid 90's -will get baseline abg  -of note cxr NAD  HTN -borderline holding outpatient medication resume as able   HLD -continue statin   Gout -no current flare   GERD -ppi  DVT prophylaxis: heparin Code Status: discussed with patient she notes she is a DNR Family Communication: no family at bedside  Disposition Plan:patient  expected to be admitted greater than 2 midnights Consults called:n/a Admission status:inpatient   Clance Boll MD [facility]   If 7PM-7AM, please contact night-coverage [email or web address] Password [email or web address]  03/05/2020, 9:22 PM

## 2020-03-05 NOTE — ED Triage Notes (Signed)
Patient in today c/o dysuria, urinary frequency, urinary incontinence, emesis and diarrhea x 3 days. Patient has taken OTC Urinary Pain Relief. Patient took an OTC urinary test and it was positive.

## 2020-03-05 NOTE — Sepsis Progress Note (Signed)
Sepsis progress checked

## 2020-03-05 NOTE — Sepsis Progress Note (Signed)
Sepsis protocol being followed by [facility] 

## 2020-03-05 NOTE — ED Triage Notes (Signed)
Arrived via EMS from [facility]. Per EMS, labs at urgent care state that she has renal failure and urinary tract infection. Patient reports pain with urination, frequency, and diarrhea x3 days.

## 2020-03-05 NOTE — Sepsis Progress Note (Signed)
Notified bedside nurse of need to draw lactic acid.  

## 2020-03-05 NOTE — Consult Note (Signed)
CODE SEPSIS - PHARMACY COMMUNICATION  **Broad Spectrum Antibiotics should be administered within 1 hour of Sepsis diagnosis**  Time Code Sepsis Called/Page Received: 1716  Antibiotics Ordered: Ceftriaxone 1g IV q24h  Time of 1st antibiotic administration: [place], PharmD Pharmacy Resident  03/05/2020 5:19 PM

## 2020-03-05 NOTE — ED Provider Notes (Signed)
[facility] Emergency Department Provider Note  Time seen: 5:06 PM  I have reviewed the triage vital signs and the nursing notes.   HISTORY  Chief Complaint Diarrhea   HPI Gloria Day is a 87 y.o. female the past medical history of COPD, hypertension, CHF, CKD, presents to the emergency department from urgent care with concerns of renal failure and UTI.  According to the patient for the past several days she has been nauseated with frequent episodes of vomiting and diarrhea as well as dysuria which she describes as a burning sensation when she urinates.  Denies any known black or bloody stool.  No known fever.  No increased cough of her baseline, no shortness of breath of her baseline.   Patient states she went to urgent care for evaluation as she cannot be seen by her PCP.  They sent her from urgent care to the emergency department for further work-up.  Past Medical History:  Diagnosis Date  . CHF (congestive heart failure) ([place])   . COPD (chronic obstructive pulmonary disease) ([place])   . Hypertension   . Renal insufficiency     Patient Active Problem List   Diagnosis Date Noted  . Leukocytosis 12/18/2017    Past Surgical History:  Procedure Laterality Date  . ABDOMINAL HYSTERECTOMY    . CHOLECYSTECTOMY    . TONSILLECTOMY    . TOTAL KNEE ARTHROPLASTY      Prior to Admission medications   Medication Sig Start Date End Date Taking? Authorizing Provider  allopurinol (ZYLOPRIM) 100 MG tablet Take 100 mg by mouth daily.     [provider]  calcitRIOL (ROCALTROL) 0.25 MCG capsule Take 0.25 mcg by mouth daily.     [provider]  clobetasol ointment (TEMOVATE) 0.05 % APPLY A PEA SIZED AMOUNT DAILY FOR 3 WEEKS THEN EVERY OTHER DAY FOR 3 WEEKS THEN TWICE WEEKLY FOR MAINTENANCE. 10/21/17   [provider]  diltiazem (CARDIZEM CD) 180 MG 24 hr capsule Take 180 mg by mouth daily.  11/29/13   [provider]  furosemide (LASIX)  20 MG tablet Take 40 mg by mouth daily.     [provider]  hydrocortisone 2.5 % cream APPLY CREAM TWICE DAILY AFTER BM 12/03/17   [provider]  methylcellulose oral powder Take by mouth daily.     [provider]  metoprolol tartrate (LOPRESSOR) 50 MG tablet Take by mouth. 11/29/13   [provider]  Multiple Vitamins-Minerals (PRESERVISION AREDS 2+MULTI VIT PO) Take by mouth.    [provider]  pantoprazole (PROTONIX) 40 MG tablet Take 40 mg by mouth daily.    [provider]  Polyethyl Glycol-Propyl Glycol 0.4-0.3 % SOLN Apply to eye.     [provider]  Polyethylene Glycol 3350 (PEG 3350) POWD Take by mouth.    [provider]  Probiotic Product ([employer] PROBIOTIC MAXIMUM STRENGTH) CAPS Take by mouth.     [provider]  RABEprazole (ACIPHEX) 20 MG tablet Take 20 mg by mouth daily. 12/08/17   [provider]  simvastatin (ZOCOR) 40 MG tablet TAKE ONE TABLET BY MOUTH ONCE NIGHTLY 02/02/17   [provider]  vitamin B-12 (CYANOCOBALAMIN) 1000 MCG tablet Take 1,000 mcg by mouth daily.     [provider]  ferrous sulfate 325 (65 FE) MG tablet Take 325 mg by mouth daily with breakfast.   03/05/20  [provider]    No Known Allergies  Family History  Problem Relation  Age of Onset  . Heart attack Mother   . Heart attack Father   . Diabetes Sister   . Skin cancer Brother   . Lung cancer Brother   . Colon cancer Brother   . Bone cancer Brother   . Parkinson's disease Brother   . Heart attack Brother     Social History Social History   Tobacco Use  . Smoking status: Never Smoker  . Smokeless tobacco: Never Used  Vaping Use  . Vaping Use: Never used  Substance Use Topics  . Alcohol use: Never  . Drug use: Never    Review of Systems Constitutional: Negative for fever. Cardiovascular: Negative for chest pain. Respiratory: Negative for increased shortness of  breath. Gastrointestinal: Moderate left lower quadrant abdominal pain per patient.  Dull.  Positive for nausea vomiting diarrhea for the past several days. Genitourinary: Positive for dysuria. Musculoskeletal: Negative for musculoskeletal complaints Neurological: Negative for headache All other ROS negative  ____________________________________________   PHYSICAL EXAM:  VITAL SIGNS: ED Triage Vitals  Enc Vitals Group     BP 03/05/20 1648 121/87     Pulse Rate 03/05/20 1648 82     Resp 03/05/20 1648 (!) 30     Temp 03/05/20 1648 98.9 F (37.2 C)     Temp Source 03/05/20 1648 Oral     SpO2 03/05/20 1648 97 %     Weight 03/05/20 1649 210 lb (95.3 kg)     Height 03/05/20 1649 '5\' 4"'$  (1.626 m)     Head Circumference --      Peak Flow --      Pain Score 03/05/20 1649 6     Pain Loc --      Pain Edu? --      Excl. in [place]? --    Constitutional: Alert and oriented. Well appearing and in no distress. Eyes: Normal exam ENT      Head: Normocephalic and atraumatic.      Mouth/Throat: Mucous membranes are moist. Cardiovascular: Normal rate, regular rhythm Respiratory: Patient does have mild tachypnea but no increased respiratory effort or increased work of breathing.  Breath sounds are clear Gastrointestinal: Soft, mild left lower quadrant tenderness to palpation. Musculoskeletal: Nontender with normal range of motion in all extremities. Neurologic:  Normal speech and language. No gross focal neurologic deficits Skin:  Skin is warm, dry and intact.  Psychiatric: Mood and affect are normal.   ____________________________________________   RADIOLOGY  CT scan does not appear to show any acute abnormality.  ____________________________________________   INITIAL IMPRESSION / ASSESSMENT AND PLAN / ED COURSE  Pertinent labs & imaging results that were available during my care of the patient were reviewed by me and considered in my medical decision making (see chart for details).    Patient presents emergency department from urgent care.  Patient states nausea vomiting diarrhea for the past few days as well as dysuria.  Patient's labs show significant leukocytosis of 24,000 and a urinalysis consistent with urinary tract infection and a urine culture was sent.  Patient is anion gap is elevated consistent with dehydration, which would also explain the patient's worsening renal function.  We will begin IV hydration, IV antibiotics.  Given the patient's left lower quadrant tenderness we will obtain CT imaging of abdomen and pelvis to evaluate as well.  Anticipate likely admission to the hospital given the patient's nausea vomiting significant dehydration and infection unable to tolerate fluids.  CT scan does not show any acute abnormality. Given the patient's  elevated white blood cell count diarrhea have sent a C. difficile sample as well. Patient's urinalysis consistent with significant urinary tract infection and a urine culture was sent from urgent care. We will admit the patient for IV antibiotics, dehydration IV hydration renal failure.  Gloria Day was evaluated in Emergency Department on 03/05/2020 for the symptoms described in the history of present illness. She was evaluated in the context of the global COVID-19 pandemic, which necessitated consideration that the patient might be at risk for infection with the SARS-CoV-2 virus that causes COVID-19. Institutional protocols and algorithms that pertain to the evaluation of patients at risk for COVID-19 are in a state of rapid change based on information released by regulatory bodies including the CDC and federal and state organizations. These policies and algorithms were followed during the patient's care in the ED.  ____________________________________________   FINAL CLINICAL IMPRESSION(S) / ED DIAGNOSES  Dehydration Urinary tract infection Renal failure   Harvest Dark, MD 03/05/20 2144

## 2020-03-06 ENCOUNTER — Encounter: Payer: Self-pay | Admitting: Internal Medicine

## 2020-03-06 DIAGNOSIS — N189 Chronic kidney disease, unspecified: Secondary | ICD-10-CM

## 2020-03-06 DIAGNOSIS — I5031 Acute diastolic (congestive) heart failure: Secondary | ICD-10-CM

## 2020-03-06 DIAGNOSIS — R6520 Severe sepsis without septic shock: Secondary | ICD-10-CM

## 2020-03-06 DIAGNOSIS — A4151 Sepsis due to Escherichia coli [E. coli]: Secondary | ICD-10-CM | POA: Diagnosis not present

## 2020-03-06 DIAGNOSIS — E876 Hypokalemia: Secondary | ICD-10-CM | POA: Diagnosis not present

## 2020-03-06 DIAGNOSIS — E785 Hyperlipidemia, unspecified: Secondary | ICD-10-CM

## 2020-03-06 DIAGNOSIS — N179 Acute kidney failure, unspecified: Secondary | ICD-10-CM | POA: Diagnosis not present

## 2020-03-06 DIAGNOSIS — I10 Essential (primary) hypertension: Secondary | ICD-10-CM

## 2020-03-06 LAB — HEMOGLOBIN A1C
Hgb A1c MFr Bld: 5.5 % (ref 4.8–5.6)
Mean Plasma Glucose: 111.15 mg/dL

## 2020-03-06 LAB — BLOOD GAS, ARTERIAL
Acid-base deficit: 0.2 mmol/L (ref 0.0–2.0)
Bicarbonate: 23.9 mmol/L (ref 20.0–28.0)
FIO2: 0.21
O2 Saturation: 94.9 %
Patient temperature: 37
pCO2 arterial: 36 mmHg (ref 32.0–48.0)
pH, Arterial: 7.43 (ref 7.350–7.450)
pO2, Arterial: 73 mmHg — ABNORMAL LOW (ref 83.0–108.0)

## 2020-03-06 LAB — BLOOD CULTURE ID PANEL (REFLEXED) - BCID2

## 2020-03-06 LAB — COMPREHENSIVE METABOLIC PANEL
ALT: 12 U/L (ref 0–44)
AST: 26 U/L (ref 15–41)
Albumin: 2.7 g/dL — ABNORMAL LOW (ref 3.5–5.0)
Alkaline Phosphatase: 103 U/L (ref 38–126)
Anion gap: 14 (ref 5–15)
BUN: 53 mg/dL — ABNORMAL HIGH (ref 8–23)
CO2: 25 mmol/L (ref 22–32)
Calcium: 7.9 mg/dL — ABNORMAL LOW (ref 8.9–10.3)
Chloride: 97 mmol/L — ABNORMAL LOW (ref 98–111)
Creatinine, Ser: 3.37 mg/dL — ABNORMAL HIGH (ref 0.44–1.00)
GFR, Estimated: 13 mL/min — ABNORMAL LOW (ref >60)
Glucose, Bld: 120 mg/dL — ABNORMAL HIGH (ref 70–99)
Potassium: 3.1 mmol/L — ABNORMAL LOW (ref 3.5–5.1)
Sodium: 136 mmol/L (ref 135–145)
Total Bilirubin: 1.2 mg/dL (ref 0.3–1.2)
Total Protein: 5.4 g/dL — ABNORMAL LOW (ref 6.5–8.1)

## 2020-03-06 LAB — CBC
HCT: 32.5 % — ABNORMAL LOW (ref 36.0–46.0)
Hemoglobin: 10.8 g/dL — ABNORMAL LOW (ref 12.0–15.0)
MCH: 29.5 pg (ref 26.0–34.0)
MCHC: 33.2 g/dL (ref 30.0–36.0)
MCV: 88.8 fL (ref 80.0–100.0)
Platelets: 124 10*3/uL — ABNORMAL LOW (ref 150–400)
RBC: 3.66 MIL/uL — ABNORMAL LOW (ref 3.87–5.11)
RDW: 13.9 % (ref 11.5–15.5)
WBC: 17.1 10*3/uL — ABNORMAL HIGH (ref 4.0–10.5)
nRBC: 0 % (ref 0.0–0.2)

## 2020-03-06 LAB — GLUCOSE, CAPILLARY: Glucose-Capillary: 114 mg/dL — ABNORMAL HIGH (ref 70–99)

## 2020-03-06 LAB — TSH: TSH: 1.745 u[IU]/mL (ref 0.350–4.500)

## 2020-03-06 MED ORDER — POTASSIUM CHLORIDE CRYS ER 20 MEQ PO TBCR
20.0000 meq | EXTENDED_RELEASE_TABLET | Freq: Once | ORAL | Status: AC
  Administered 2020-03-06: 20 meq via ORAL
  Filled 2020-03-06: qty 1

## 2020-03-06 MED ORDER — BUDESONIDE 0.5 MG/2ML IN SUSP
0.5000 mg | Freq: Two times a day (BID) | RESPIRATORY_TRACT | Status: DC
  Administered 2020-03-06 – 2020-03-14 (×15): 0.5 mg via RESPIRATORY_TRACT
  Filled 2020-03-06 (×15): qty 2

## 2020-03-06 MED ORDER — FUROSEMIDE 10 MG/ML IJ SOLN
60.0000 mg | Freq: Once | INTRAMUSCULAR | Status: AC
  Administered 2020-03-06: 60 mg via INTRAVENOUS
  Filled 2020-03-06: qty 8

## 2020-03-06 MED ORDER — POTASSIUM CHLORIDE CRYS ER 20 MEQ PO TBCR
20.0000 meq | EXTENDED_RELEASE_TABLET | Freq: Two times a day (BID) | ORAL | Status: AC
  Administered 2020-03-06 – 2020-03-07 (×3): 20 meq via ORAL
  Filled 2020-03-06 (×3): qty 1

## 2020-03-06 MED ORDER — ALLOPURINOL 100 MG PO TABS
100.0000 mg | ORAL_TABLET | Freq: Every day | ORAL | Status: DC
  Administered 2020-03-06 – 2020-03-14 (×9): 100 mg via ORAL
  Filled 2020-03-06 (×9): qty 1

## 2020-03-06 MED ORDER — IPRATROPIUM-ALBUTEROL 0.5-2.5 (3) MG/3ML IN SOLN
3.0000 mL | Freq: Four times a day (QID) | RESPIRATORY_TRACT | Status: DC
  Administered 2020-03-06 – 2020-03-09 (×10): 3 mL via RESPIRATORY_TRACT
  Filled 2020-03-06 (×11): qty 3

## 2020-03-06 MED ORDER — OCUVITE-LUTEIN PO CAPS
1.0000 | ORAL_CAPSULE | Freq: Every day | ORAL | Status: DC
  Administered 2020-03-06 – 2020-03-14 (×9): 1 via ORAL
  Filled 2020-03-06 (×9): qty 1

## 2020-03-06 MED ORDER — SODIUM CHLORIDE 0.9 % IV SOLN
2.0000 g | INTRAVENOUS | Status: AC
  Administered 2020-03-06 – 2020-03-08 (×3): 2 g via INTRAVENOUS
  Filled 2020-03-06 (×2): qty 20
  Filled 2020-03-06: qty 2

## 2020-03-06 MED ORDER — PANTOPRAZOLE SODIUM 40 MG PO TBEC
40.0000 mg | DELAYED_RELEASE_TABLET | Freq: Every day | ORAL | Status: DC
  Administered 2020-03-07 – 2020-03-14 (×8): 40 mg via ORAL
  Filled 2020-03-06 (×8): qty 1

## 2020-03-06 MED ORDER — METHYLPREDNISOLONE SODIUM SUCC 40 MG IJ SOLR
40.0000 mg | Freq: Every day | INTRAMUSCULAR | Status: DC
  Administered 2020-03-06 – 2020-03-09 (×4): 40 mg via INTRAVENOUS
  Filled 2020-03-06 (×4): qty 1

## 2020-03-06 NOTE — Evaluation (Signed)
Physical Therapy Evaluation Patient Details Name: Gloria Day MRN: [id number] DOB: [birth year] Today's Date: 03/06/2020   History of Present Illness  Pt is admitted for UTI with complaints of dysuria and diarrhea x 3 days. History includes CHF, COPD, and HTN. Pt reports increased SOB and wheezing the past few days.  Clinical Impression  Pt is a pleasant 87 year old female who was admitted for UTI. Pt performs bed mobility/transfers with min assist and unable to ambulate at this time due to fatigue. RW used and able to stand for prolonged period while this therapist provided hygiene secondary to incontinent BM smear. Pt fatigues quickly. Vitals maintained on 2L of O2. Pt demonstrates deficits with strength/endurance/mobility. Would benefit from skilled PT to address above deficits and promote optimal return to PLOF. Recommend transition to [place] upon discharge from acute hospitalization.     Follow Up Recommendations Home health PT;Supervision/Assistance - 24 hour (daughter able to provide 24/7)    Equipment Recommendations  None recommended by PT    Recommendations for Other Services       Precautions / Restrictions Precautions Precautions: Fall Restrictions Weight Bearing Restrictions: No      Mobility  Bed Mobility Overal bed mobility: Needs Assistance Bed Mobility: Supine to Sit     Supine to sit: Min assist     General bed mobility comments: needs slight assist with trunkal elevation. Once seated, able to demonstrate upright posture. Reports slight dizziness, however quickly improves.    Transfers Overall transfer level: Needs assistance Equipment used: Rolling walker (2 wheeled) Transfers: Sit to/from Stand Sit to Stand: Min assist         General transfer comment: assist for anterior translation. Once standing, able to stand with supervision. Does fatigue quickly and demonstrates increased WOB. O2 sats at 92% and HR at 105bpm with exertion. 2L of  O2  Ambulation/Gait             General Gait Details: too fatigued to attempt today  Stairs            Wheelchair Mobility    Modified Rankin (Stroke Patients Only)       Balance Overall balance assessment: Needs assistance Sitting-balance support: Feet supported Sitting balance-Leahy Scale: Good     Standing balance support: Bilateral upper extremity supported Standing balance-Leahy Scale: Fair Standing balance comment: reports no recent falls                             Pertinent Vitals/Pain Pain Assessment: No/denies pain    Home Living Family/patient expects to be discharged to:: Private residence Living Arrangements: Children (daughter) Available Help at Discharge: Family;Available 24 hours/day Type of Home: House Home Access: Ramped entrance     Home Layout: One level Home Equipment: Wheelchair - manual (3 wheel RW)      Prior Function Level of Independence: Needs assistance         Comments: pt relies on daughter for assistance with all ADLs and IADLs. Pt reports she is typically limited household ambulator ambulating from bed->bathroom. Able to walk down ramp to exit home.     Hand Dominance        Extremity/Trunk Assessment   Upper Extremity Assessment Upper Extremity Assessment: Generalized weakness (B UE grossly 3/5)    Lower Extremity Assessment Lower Extremity Assessment: Generalized weakness (B LE grossly 3+/5)       Communication   Communication: No difficulties (resting tremors present)  Cognition Arousal/Alertness:  Awake/alert Behavior During Therapy: WFL for tasks assessed/performed Overall Cognitive Status: Within Functional Limits for tasks assessed                                        General Comments      Exercises Other Exercises Other Exercises: supine/seated ther-ex performed including SLRs, hip abd/add, heel slides and LAQ. All ther-ex performed x 10 reps with mod assist.  Increased WOB noted   Assessment/Plan    PT Assessment Patient needs continued PT services  PT Problem List Decreased strength;Decreased activity tolerance;Decreased balance;Decreased mobility;Obesity       PT Treatment Interventions Gait training;Therapeutic exercise;Therapeutic activities    PT Goals (Current goals can be found in the Care Plan section)  Acute Rehab PT Goals Patient Stated Goal: to go home PT Goal Formulation: With patient Time For Goal Achievement: 03/20/20 Potential to Achieve Goals: Good    Frequency Min 2X/week   Barriers to discharge        Co-evaluation               AM-PAC PT "6 Clicks" Mobility  Outcome Measure Help needed turning from your back to your side while in a flat bed without using bedrails?: A Little Help needed moving from lying on your back to sitting on the side of a flat bed without using bedrails?: A Little Help needed moving to and from a bed to a chair (including a wheelchair)?: A Little Help needed standing up from a chair using your arms (e.g., wheelchair or bedside chair)?: A Little Help needed to walk in hospital room?: A Little Help needed climbing 3-5 steps with a railing? : A Lot 6 Click Score: 17    End of Session Equipment Utilized During Treatment: Gait belt Activity Tolerance: Patient limited by fatigue Patient left: in bed;with family/visitor present Nurse Communication: Mobility status PT Visit Diagnosis: Unsteadiness on feet (R26.81);Muscle weakness (generalized) (M62.81);Difficulty in walking, not elsewhere classified (R26.2)    Time: 1338-1410 PT Time Calculation (min) (ACUTE ONLY): 32 min   Charges:   PT Evaluation $PT Eval Low Complexity: 1 Low PT Treatments $Therapeutic Exercise: 8-22 mins        Greggory Stallion, PT, DPT [phone]   Janee Ureste 03/06/2020, 4:35 PM

## 2020-03-06 NOTE — Progress Notes (Signed)
Patient ID: Gloria Day, female   DOB: [birth year], 87 y.o.   MRN: [id number] [facility] PROGRESS NOTE  Gloria Day [id number] DOB: [birth year] DOA: 03/05/2020 PCP: Sofie Hartigan, MD  HPI/Subjective: Patient awakened from sleep. Not feeling that good. Having some diarrhea. States she is urinating. She states she has chronic kidney disease stage IV. Feeling weak. Some shortness of breath. Patient admitted with sepsis E. coli in the blood culture.  Objective: Vitals:   03/06/20 0746 03/06/20 1129  BP: 130/74 126/78  Pulse: 94 91  Resp: 19 18  Temp: 98.6 F (37 C) 98.3 F (36.8 C)  SpO2: 96% 98%    Intake/Output Summary (Last 24 hours) at 03/06/2020 1303 Last data filed at 03/06/2020 1019 Gross per 24 hour  Intake 165.41 ml  Output 300 ml  Net -134.59 ml   Filed Weights   03/05/20 1649  Weight: 95.3 kg    ROS: Review of Systems  Constitutional: Positive for malaise/fatigue.  Respiratory: Positive for cough and shortness of breath.   Cardiovascular: Negative for chest pain.  Gastrointestinal: Positive for diarrhea. Negative for abdominal pain and vomiting.   Exam: Physical Exam HENT:     Head: Normocephalic.     Mouth/Throat:     Pharynx: No oropharyngeal exudate.  Eyes:     General: Lids are normal.     Conjunctiva/sclera: Conjunctivae normal.  Cardiovascular:     Rate and Rhythm: Normal rate and regular rhythm.     Heart sounds: Normal heart sounds, S1 normal and S2 normal.  Pulmonary:     Breath sounds: Examination of the right-middle field reveals wheezing. Examination of the left-middle field reveals wheezing. Examination of the right-lower field reveals decreased breath sounds and rales. Examination of the left-lower field reveals decreased breath sounds and rales. Decreased breath sounds, wheezing and rales present. No rhonchi.  Abdominal:     Palpations: Abdomen is soft.     Tenderness: There is no abdominal tenderness.  Musculoskeletal:      Right lower leg: Swelling present.     Left lower leg: Swelling present.  Skin:    General: Skin is warm.     Findings: No rash.  Neurological:     Mental Status: She is alert.     Comments: Answers all questions appropriately       Data Reviewed: Basic Metabolic Panel: Recent Labs  Lab 03/05/20 1447 03/06/20 0628  NA 134* 136  K 3.6 3.1*  CL 93* 97*  CO2 22 25  GLUCOSE 147* 120*  BUN 47* 53*  CREATININE 3.21* 3.37*  CALCIUM 8.5* 7.9*   Liver Function Tests: Recent Labs  Lab 03/05/20 1447 03/06/20 0628  AST 37 26  ALT 22 12  ALKPHOS 111 103  BILITOT 1.4* 1.2  PROT 6.3* 5.4*  ALBUMIN 3.2* 2.7*   CBC: Recent Labs  Lab 03/05/20 1447 03/05/20 2221 03/06/20 0628  WBC 24.3* 14.8* 17.1*  NEUTROABS 20.7*  --   --   HGB 12.2 11.4* 10.8*  HCT 37.3 35.1* 32.5*  MCV 88.8 89.5 88.8  PLT 148* 113* 124*    CBG: Recent Labs  Lab 03/06/20 0747  GLUCAP 114*    Recent Results (from the past 240 hour(s))  Urine culture     Status: None (Preliminary result)   Collection Time: 03/05/20  2:09 PM   Specimen: Urine, Clean Catch  Result Value Ref Range Status   Specimen Description   Final    URINE, CLEAN CATCH Performed at [facility], [address] [place], [place] [address]    Special Requests   Final    NONE Performed at [facility], [place] [address], [place], [place] [address]    Culture PENDING  Incomplete   Report Status PENDING  Incomplete  SARS Coronavirus 2 by RT PCR (hospital order, performed in [facility] hospital lab) Nasopharyngeal Nasopharyngeal Swab     Status: None   Collection Time: 03/05/20  6:15 PM   Specimen: Nasopharyngeal Swab  Result Value Ref Range Status   SARS Coronavirus 2 NEGATIVE NEGATIVE Final    Comment: (NOTE) SARS-CoV-2 target nucleic acids are NOT DETECTED.  The SARS-CoV-2 RNA is generally detectable in upper and lower respiratory specimens during the acute phase of infection. The lowest concentration  of SARS-CoV-2 viral copies this assay can detect is 250 copies / mL. A negative result does not preclude SARS-CoV-2 infection and should not be used as the sole basis for treatment or other patient management decisions.  A negative result may occur with improper specimen collection / handling, submission of specimen other than nasopharyngeal swab, presence of viral mutation(s) within the areas targeted by this assay, and inadequate number of viral copies (<250 copies / mL). A negative result must be combined with clinical observations, patient history, and epidemiological information.  Fact Sheet for Patients:   StrictlyIdeas.no  Fact Sheet for Healthcare Providers: BankingDealers.co.za  This test is not yet approved or  cleared by the [place] FDA and has been authorized for detection and/or diagnosis of SARS-CoV-2 by FDA under an Emergency Use Authorization (EUA).  This EUA will remain in effect (meaning this test can be used) for the duration of the COVID-19 declaration under Section 564(b)(1) of the Act, 21 U.S.C. section 360bbb-3(b)(1), unless the authorization is terminated or revoked sooner.  Performed at [facility], [address], [place], [place] [address]   Blood Culture (routine x 2)     Status: None (Preliminary result)   Collection Time: 03/05/20  6:15 PM   Specimen: BLOOD  Result Value Ref Range Status   Specimen Description   Final    BLOOD LEFT ASSIST CONTROL Performed at [facility], [address], [place], [place] [address]    Special Requests   Final    BOTTLES DRAWN AEROBIC AND ANAEROBIC Blood Culture results may not be optimal due to an inadequate volume of blood received in culture bottles Performed at [facility], [address], [place], [place] [address]    Culture  Setup Time   Final    GRAM NEGATIVE RODS IN BOTH AEROBIC AND ANAEROBIC BOTTLES CRITICAL RESULT  CALLED TO, READ BACK BY AND VERIFIED WITHLloyd Huger AT [id number] 03/06/20 SDR Performed at [facility], [place] [address], [place], [place] [address]    Culture GRAM NEGATIVE RODS  Final   Report Status PENDING  Incomplete  Blood Culture (routine x 2)     Status: None (Preliminary result)   Collection Time: 03/05/20  6:15 PM   Specimen: BLOOD  Result Value Ref Range Status   Specimen Description BLOOD RIGHT ASSIST CONTROL  Final   Special Requests   Final    BOTTLES DRAWN AEROBIC AND ANAEROBIC Blood Culture results may not be optimal due to an inadequate volume of blood received in culture bottles   Culture  Setup Time   Final    GRAM NEGATIVE RODS ANAEROBIC BOTTLE ONLY CRITICAL VALUE NOTED.  VALUE IS CONSISTENT WITH PREVIOUSLY  REPORTED AND CALLED VALUE. Performed at [facility], [address] [place], [place] [address]    Culture GRAM NEGATIVE RODS  Final   Report Status PENDING  Incomplete  Blood Culture ID Panel (Reflexed)     Status: Abnormal   Collection Time: 03/05/20  6:15 PM  Result Value Ref Range Status   Enterococcus faecalis NOT DETECTED NOT DETECTED Final   Enterococcus Faecium NOT DETECTED NOT DETECTED Final   Listeria monocytogenes NOT DETECTED NOT DETECTED Final   Staphylococcus species NOT DETECTED NOT DETECTED Final   Staphylococcus aureus (BCID) NOT DETECTED NOT DETECTED Final   Staphylococcus epidermidis NOT DETECTED NOT DETECTED Final   Staphylococcus lugdunensis NOT DETECTED NOT DETECTED Final   Streptococcus species NOT DETECTED NOT DETECTED Final   Streptococcus agalactiae NOT DETECTED NOT DETECTED Final   Streptococcus pneumoniae NOT DETECTED NOT DETECTED Final   Streptococcus pyogenes NOT DETECTED NOT DETECTED Final   A.calcoaceticus-baumannii NOT DETECTED NOT DETECTED Final   Bacteroides fragilis NOT DETECTED NOT DETECTED Final   Enterobacterales DETECTED (A) NOT DETECTED Final    Comment: Enterobacterales represent a large order of gram  negative bacteria, not a single organism. CRITICAL RESULT CALLED TO, READ BACK BY AND VERIFIED WITH:  NATHAN BELUE AT [id number] 03/06/20 SDR    Enterobacter cloacae complex NOT DETECTED NOT DETECTED Final   Escherichia coli DETECTED (A) NOT DETECTED Final    Comment: CRITICAL RESULT CALLED TO, READ BACK BY AND VERIFIED WITH:  NATHAN BELUE AT [id number] 03/06/20 SDR    Klebsiella aerogenes NOT DETECTED NOT DETECTED Final   Klebsiella oxytoca NOT DETECTED NOT DETECTED Final   Klebsiella pneumoniae NOT DETECTED NOT DETECTED Final   Proteus species NOT DETECTED NOT DETECTED Final   Salmonella species NOT DETECTED NOT DETECTED Final   Serratia marcescens NOT DETECTED NOT DETECTED Final   Haemophilus influenzae NOT DETECTED NOT DETECTED Final   Neisseria meningitidis NOT DETECTED NOT DETECTED Final   Pseudomonas aeruginosa NOT DETECTED NOT DETECTED Final   Stenotrophomonas maltophilia NOT DETECTED NOT DETECTED Final   Candida albicans NOT DETECTED NOT DETECTED Final   Candida auris NOT DETECTED NOT DETECTED Final   Candida glabrata NOT DETECTED NOT DETECTED Final   Candida krusei NOT DETECTED NOT DETECTED Final   Candida parapsilosis NOT DETECTED NOT DETECTED Final   Candida tropicalis NOT DETECTED NOT DETECTED Final   Cryptococcus neoformans/gattii NOT DETECTED NOT DETECTED Final   CTX-M ESBL NOT DETECTED NOT DETECTED Final   Carbapenem resistance IMP NOT DETECTED NOT DETECTED Final   Carbapenem resistance KPC NOT DETECTED NOT DETECTED Final   Carbapenem resistance NDM NOT DETECTED NOT DETECTED Final   Carbapenem resist OXA 48 LIKE NOT DETECTED NOT DETECTED Final   Carbapenem resistance VIM NOT DETECTED NOT DETECTED Final    Comment: Performed at [facility], [address] [place], [place] [address]     Studies: CT ABDOMEN PELVIS WO CONTRAST  Result Date: 03/05/2020 CLINICAL DATA:  Nausea/vomiting/diarrhea, pain for several days EXAM: CT ABDOMEN AND PELVIS WITHOUT CONTRAST TECHNIQUE:  Multidetector CT imaging of the abdomen and pelvis was performed following the standard protocol without IV contrast. COMPARISON:  07/27/2017, 07/19/2019 FINDINGS: Lower chest: No acute pleural or parenchymal lung disease. Unenhanced CT was performed per clinician order. Lack of IV contrast limits sensitivity and specificity, especially for evaluation of abdominal/pelvic solid viscera. Hepatobiliary: No focal liver abnormality is seen. Status post cholecystectomy. No biliary dilatation. Pancreas: Unremarkable. No pancreatic ductal dilatation or surrounding inflammatory changes. Known pancreatic cysts,  largest in the uncinate process, seen on previous studies are not well visualized on this unenhanced exam. Spleen: Normal in size without focal abnormality. Adrenals/Urinary Tract: Stable bilateral adrenal glands. Exophytic cyst right kidney unchanged. No urinary tract calculi or obstructive uropathy within either kidney. The bladder is decompressed, which limits its evaluation. Stomach/Bowel: No bowel obstruction or ileus. There is diverticulosis of the descending and sigmoid colon. Wall thickening of the mid sigmoid colon likely reflects postinflammatory scarring. I do not see any signs of active inflammation on this exam. Vascular/Lymphatic: Stable atherosclerosis of the abdominal aorta and its branches. Multiple borderline enlarged retroperitoneal lymph nodes are unchanged and likely reactive. Largest measures 10 mm in the aortocaval region. Reproductive: Status post hysterectomy. No adnexal masses. Other: No free fluid or free gas. Small fat containing umbilical hernia. Musculoskeletal: No acute or destructive bony lesions. Reconstructed images demonstrate no additional findings. IMPRESSION: 1. No urinary tract calculi or obstructive uropathy. 2. Diverticulosis of the descending and sigmoid colon. Wall thickening of the mid sigmoid colon likely reflects postinflammatory scarring. I do not see any signs of active  inflammation on this exam. 3. Known pancreatic cysts, largest in the uncinate process, seen on previous studies are not well visualized on this unenhanced exam. Please refer to MRI discussion 07/19/2019 which recommended 2 year follow-up MRI. 4. Aortic Atherosclerosis (ICD10-I70.0). Electronically Signed   By: Randa Ngo M.D.   On: 03/05/2020 17:47   DG Chest 2 View  Result Date: 03/05/2020 CLINICAL DATA:  Short of breath, wheezing EXAM: CHEST - 2 VIEW COMPARISON:  04/29/2013 FINDINGS: Frontal and lateral views of the chest demonstrate a stable cardiac silhouette. Chronic elevation the right hemidiaphragm. No airspace disease, effusion, or pneumothorax. No acute bony abnormalities. IMPRESSION: 1. No acute intrathoracic process. 2. Chronic elevation of the right hemidiaphragm. Electronically Signed   By: Randa Ngo M.D.   On: 03/05/2020 15:35   DG Chest Portable 1 View  Result Date: 03/05/2020 CLINICAL DATA:  Shortness of breath EXAM: PORTABLE CHEST 1 VIEW COMPARISON:  March 05, 2020 FINDINGS: The heart size and mediastinal contours are unchanged with mild cardiomegaly. There is elevation of the right hemidiaphragm. There is prominence of the central pulmonary vasculature. No pleural effusion. No acute osseous abnormality. IMPRESSION: Mild pulmonary vascular congestion. Electronically Signed   By: Prudencio Pair M.D.   On: 03/05/2020 21:43    Scheduled Meds: . allopurinol  100 mg Oral Daily  . calcitRIOL  0.25 mcg Oral Daily  . heparin  5,000 Units Subcutaneous Q8H  . multivitamin-lutein  1 capsule Oral Daily  . pantoprazole  40 mg Oral Daily  . potassium chloride  20 mEq Oral BID  . simvastatin  40 mg Oral q1800   Continuous Infusions: . cefTRIAXone (ROCEPHIN)  IV 2 g (03/06/20 1131)   Brief history: Patient admitted 03/05/2020 with severe sepsis. Blood culture growing E. Coli, likely urinary source on Rocephin. History of CHF, COPD hypertension and chronic kidney disease stage  IV.  Assessment/Plan:  1. E. Coli, severe sepsis present on admission with acute kidney injury on chronic kidney disease stage IV. Patient also had fever of 102.6, tachycardia and leukocytosis. Continue Rocephin at larger dose. Likely urine source. Patient also having diarrhea. 2. Acute diastolic congestive heart failure hold IV fluids currently. Check an echocardiogram 3. Acute kidney injury on chronic kidney disease stage IV. Received fluids overnight with patient being placed on oxygen I'm hesitant on further fluids holding water pills. Will consult nephrology. 4. Hypokalemia replace potassium. 5.  Essential hypertension. Continue to hold antihypertensive medications 6. Diarrhea send off stool studies. 7. Hyperlipidemia unspecified on Zocor 8. Weakness. Physical therapy evaluation.        Code Status:     Code Status Orders  (From admission, onward)         Start     Ordered   03/06/20 0058  Do not attempt resuscitation (DNR)  Continuous       Question Answer Comment  In the event of cardiac or respiratory ARREST Do not call a "code blue"   In the event of cardiac or respiratory ARREST Do not perform Intubation, CPR, defibrillation or ACLS   In the event of cardiac or respiratory ARREST Use medication by any route, position, wound care, and other measures to relive pain and suffering. May use oxygen, suction and manual treatment of airway obstruction as needed for comfort.      03/06/20 0058        Code Status History    Date Active Date Inactive Code Status Order ID Comments User Context   03/05/2020 2203 03/06/2020 0058 Full Code [id number]  Clance Boll, MD ED   Advance Care Planning Activity     Family Communication: Daughter at the bedside Disposition Plan: Status is: Inpatient  Dispo: The patient is from: Home              Anticipated d/c is to: Dependent on how she does with physical therapy              Anticipated d/c date is: Likely will need another 3 to  4 days here in the hospital              Patient currently being treated for severe sepsis with IV antibiotics   Difficult to place patient. Hopefully not  Antibiotics:  IV Rocephin day 2  Time spent: 29 minutes  [place]

## 2020-03-06 NOTE — Plan of Care (Signed)

## 2020-03-06 NOTE — Plan of Care (Signed)
New care plan initiated 

## 2020-03-06 NOTE — Progress Notes (Signed)
Patient ID: Gloria Day, female   DOB: [birth year], 87 y.o.   MRN: [id number]  Patient having wheezing.  Lungs diffuse wheeze throughout entire lung field. Advised patient to take slow deep breaths.  We will give Solu-Medrol DuoNeb and budesonide nebulizers. We will also give 1 dose of IV Lasix.  Dr Loletha Grayer

## 2020-03-06 NOTE — Progress Notes (Signed)
PHARMACY - PHYSICIAN COMMUNICATION CRITICAL VALUE ALERT - BLOOD CULTURE IDENTIFICATION (BCID)  Gloria Day is an 87 y.o. female who presented to [facility] on 03/05/2020 with a chief complaint of painful urination, frequency and diarrhea  Assessment: Patient presents from urgent care.  2/7 blood cultures with GNR, BCID = E. Coli (no resistance detected).  CT abdomen did not reveal urinary tract abnormality (no stones, hydronephrosis, etc)  Name of physician (or Provider) Contacted: Dr Leslye Peer  Current antibiotics: ceftriaxone 1gm IV q24h  Changes to prescribed antibiotics recommended:  Patient is on recommended antibiotics - No changes needed  (ceftriaxone dose increased to 2gm IV q24h)  Results for orders placed or performed during the hospital encounter of 03/05/20  Blood Culture ID Panel (Reflexed) (Collected: 03/05/2020  6:15 PM)  Result Value Ref Range   Enterococcus faecalis NOT DETECTED NOT DETECTED   Enterococcus Faecium NOT DETECTED NOT DETECTED   Listeria monocytogenes NOT DETECTED NOT DETECTED   Staphylococcus species NOT DETECTED NOT DETECTED   Staphylococcus aureus (BCID) NOT DETECTED NOT DETECTED   Staphylococcus epidermidis NOT DETECTED NOT DETECTED   Staphylococcus lugdunensis NOT DETECTED NOT DETECTED   Streptococcus species NOT DETECTED NOT DETECTED   Streptococcus agalactiae NOT DETECTED NOT DETECTED   Streptococcus pneumoniae NOT DETECTED NOT DETECTED   Streptococcus pyogenes NOT DETECTED NOT DETECTED   A.calcoaceticus-baumannii NOT DETECTED NOT DETECTED   Bacteroides fragilis NOT DETECTED NOT DETECTED   Enterobacterales DETECTED (A) NOT DETECTED   Enterobacter cloacae complex NOT DETECTED NOT DETECTED   Escherichia coli DETECTED (A) NOT DETECTED   Klebsiella aerogenes NOT DETECTED NOT DETECTED   Klebsiella oxytoca NOT DETECTED NOT DETECTED   Klebsiella pneumoniae NOT DETECTED NOT DETECTED   Proteus species NOT DETECTED NOT DETECTED   Salmonella species NOT  DETECTED NOT DETECTED   Serratia marcescens NOT DETECTED NOT DETECTED   Haemophilus influenzae NOT DETECTED NOT DETECTED   Neisseria meningitidis NOT DETECTED NOT DETECTED   Pseudomonas aeruginosa NOT DETECTED NOT DETECTED   Stenotrophomonas maltophilia NOT DETECTED NOT DETECTED   Candida albicans NOT DETECTED NOT DETECTED   Candida auris NOT DETECTED NOT DETECTED   Candida glabrata NOT DETECTED NOT DETECTED   Candida krusei NOT DETECTED NOT DETECTED   Candida parapsilosis NOT DETECTED NOT DETECTED   Candida tropicalis NOT DETECTED NOT DETECTED   Cryptococcus neoformans/gattii NOT DETECTED NOT DETECTED   CTX-M ESBL NOT DETECTED NOT DETECTED   Carbapenem resistance IMP NOT DETECTED NOT DETECTED   Carbapenem resistance KPC NOT DETECTED NOT DETECTED   Carbapenem resistance NDM NOT DETECTED NOT DETECTED   Carbapenem resist OXA 48 LIKE NOT DETECTED NOT DETECTED   Carbapenem resistance VIM NOT DETECTED NOT DETECTED    Doreene Eland, PharmD, BCPS.   Work Cell: [phone] 03/06/2020 7:50 AM

## 2020-03-06 NOTE — ED Notes (Signed)
Pt transported at this time to floor.

## 2020-03-07 ENCOUNTER — Inpatient Hospital Stay
Admit: 2020-03-07 | Discharge: 2020-03-07 | Disposition: A | Discharge status: Home / Self Care | Payer: Medicare HMO | Attending: Internal Medicine | Admitting: Internal Medicine

## 2020-03-07 DIAGNOSIS — A4151 Sepsis due to Escherichia coli [E. coli]: Secondary | ICD-10-CM | POA: Diagnosis not present

## 2020-03-07 DIAGNOSIS — N3001 Acute cystitis with hematuria: Secondary | ICD-10-CM

## 2020-03-07 DIAGNOSIS — I10 Essential (primary) hypertension: Secondary | ICD-10-CM | POA: Diagnosis not present

## 2020-03-07 DIAGNOSIS — N179 Acute kidney failure, unspecified: Secondary | ICD-10-CM | POA: Diagnosis not present

## 2020-03-07 LAB — GLUCOSE, CAPILLARY: Glucose-Capillary: 148 mg/dL — ABNORMAL HIGH (ref 70–99)

## 2020-03-07 MED ORDER — POTASSIUM CHLORIDE CRYS ER 20 MEQ PO TBCR
40.0000 meq | EXTENDED_RELEASE_TABLET | ORAL | Status: AC
Start: 2020-03-07 — End: 2020-03-07
  Administered 2020-03-07: 40 meq via ORAL
  Filled 2020-03-07: qty 2

## 2020-03-07 NOTE — Progress Notes (Addendum)
PROGRESS NOTE    Gloria Day   [id number]  DOB: [birth year]  PCP: Sofie Hartigan, MD    DOA: 03/05/2020 LOS: 2   Brief Narrative   87 y.o. female with medical history of diastolic CHF, COPD not on home O2 , HTN, CKD stage IV, gout, anemia of chronic disease, who presented to the ED from urgent care with complaints of worsening nausea, vomiting, diarrhea and dysuria x 2-3 days. Urgent care referred patient to the ED due to low BP on 90/60 and UA showing infection and labs showing worsened renal function and leukocytosis.  Patient also reported dyspnea worse than baseline, but no hypoxia was noted.      Significant Events: - Admitted 03/05/2020  Assessment & Plan   Active Problems:   UTI (urinary tract infection)   Sepsis due to Escherichia coli with acute renal failure without septic shock (HCC)   Acute diastolic CHF (congestive heart failure) (HCC)   Acute kidney injury superimposed on CKD ([place])   Hypokalemia   Essential hypertension   Hyperlipidemia   Severe sepsis due to E. coli bacteremia and UTI -presented with severe sepsis as evidenced by leukocytosis, tachycardia, fever in the setting of UTI.  AKI superimposed on CKD stage IV reflects organ dysfunction consistent with severe sepsis.  Sepsis physiology improved. --Continue Rocephin 2 g --Follow repeat cultures  Nausea vomiting -reported on admission, improved.  Likely secondary to above.  Antiemetics as needed.  Acute on chronic diastolic CHF -IV fluids previously held.  Patient appears euvolemic on exam.  Follow-up pending echo.  Monitor volume status closely.  AKI superimposed on CKD stage IV -patient received some IV fluids on arrival.  Was subsequently placed on oxygen so fluids were stopped.  Hold diuretics for now.  Nephrology is consulted.  Monitor BMP.  Hypokalemia -potassium was replaced.  Monitor and replace as needed.  Diarrhea -present on admission, unclear etiology.  Stool studies were ordered  and pending. Patient denies further episodes of diarrhea since admission.  Monitor.  Generalized weakness-evaluated by physical therapy and home health PT with 24-hour supervision was recommended.  TOC following for disposition planning.  Hyperlipidemia -continue Zocor  Essential hypertension -antihypertensive medications are on hold due to soft BP.  Monitor BP and resume when indicated.   Obesity: Body mass index is 36.05 kg/m.  Complicates overall care and prognosis.  Recommend physical activity and diet efforts towards weight loss.  Primary care follow-up.  DVT prophylaxis: heparin injection 5,000 Units Start: 03/05/20 2215   Diet:  Diet Orders (From admission, onward)    Start     Ordered   03/06/20 1149  Diet Heart Room service appropriate? Yes; Fluid consistency: Thin  Diet effective now       Question Answer Comment  Room service appropriate? Yes   Fluid consistency: Thin      03/06/20 1149            Code Status: DNR    Subjective 03/07/20    Patient reports overall feeling well today.  She mentions an intermittent cough after which she will have some wheezing.  She does not feel short of breath however.  No fevers or chills.  Also reports that slight increase in her tremor which she says happens at times.   Disposition Plan & Communication   Status is: Inpatient  Remains inpatient appropriate because:IV treatments appropriate due to intensity of illness or inability to take PO   Dispo: The patient is from: Home  Anticipated d/c is to: Home              Anticipated d/c date is: 1-2 days              Patient currently is not medically stable to d/c.   Difficult to place patient No     Consults, Procedures, Significant Events   Consultants:   Nephrology  Procedures:   none  Antimicrobials:  Anti-infectives (From admission, onward)   Start     Dose/Rate Route Frequency Ordered Stop   03/06/20 1000  cefTRIAXone (ROCEPHIN) 2 g in sodium  chloride 0.9 % 100 mL IVPB        2 g 200 mL/hr over 30 Minutes Intravenous Every 24 hours 03/06/20 0745     03/05/20 1730  cefTRIAXone (ROCEPHIN) 1 g in sodium chloride 0.9 % 100 mL IVPB  Status:  Discontinued        1 g 200 mL/hr over 30 Minutes Intravenous Every 24 hours 03/05/20 1716 03/06/20 0745        Objective   Vitals:   03/07/20 0555 03/07/20 0729 03/07/20 0801 03/07/20 1147  BP: 133/73  (!) 104/56 (!) 129/59  Pulse: 91  (!) 102 95  Resp: '18  18 18  '$ Temp: 97.9 F (36.6 C)  98.2 F (36.8 C) 97.8 F (36.6 C)  TempSrc: Oral  Oral Oral  SpO2: 93% 98% 94% 96%  Weight:      Height:        Intake/Output Summary (Last 24 hours) at 03/07/2020 1416 Last data filed at 03/07/2020 0603 Gross per 24 hour  Intake 340 ml  Output 1500 ml  Net -1160 ml   Filed Weights   03/05/20 1649  Weight: 95.3 kg    Physical Exam:  General exam: awake, alert, no acute distress HEENT: tongue constantly moving, moist mucus membranes, hearing grossly normal  Respiratory system: CTAB, no wheezes, rales or rhonchi, normal respiratory effort. Cardiovascular system: normal S1/S2, RRR, no pedal edema.   Gastrointestinal system: soft, NT, ND, +bowel sounds. Central nervous system: A&O x3. no gross focal neurologic deficits, normal speech Skin: dry, intact, normal temperature, normal color    Labs   Data Reviewed: I have personally reviewed following labs and imaging studies  CBC: Recent Labs  Lab 03/05/20 1447 03/05/20 2221 03/06/20 0628  WBC 24.3* 14.8* 17.1*  NEUTROABS 20.7*  --   --   HGB 12.2 11.4* 10.8*  HCT 37.3 35.1* 32.5*  MCV 88.8 89.5 88.8  PLT 148* 113* [id number]*   Basic Metabolic Panel: Recent Labs  Lab 03/05/20 1447 03/06/20 0628  NA 134* 136  K 3.6 3.1*  CL 93* 97*  CO2 22 25  GLUCOSE 147* 120*  BUN 47* 53*  CREATININE 3.21* 3.37*  CALCIUM 8.5* 7.9*   GFR: Estimated Creatinine Clearance: 12.7 mL/min (A) (by C-G formula based on SCr of 3.37 mg/dL (H)). Liver  Function Tests: Recent Labs  Lab 03/05/20 1447 03/06/20 0628  AST 37 26  ALT 22 12  ALKPHOS 111 103  BILITOT 1.4* 1.2  PROT 6.3* 5.4*  ALBUMIN 3.2* 2.7*   No results for input(s): LIPASE, AMYLASE in the last 168 hours. No results for input(s): AMMONIA in the last 168 hours. Coagulation Profile: No results for input(s): INR, PROTIME in the last 168 hours. Cardiac Enzymes: No results for input(s): CKTOTAL, CKMB, CKMBINDEX, TROPONINI in the last 168 hours. BNP (last 3 results) No results for input(s): PROBNP in the last 8760 hours. HbA1C: Recent  Labs    03/06/20 0628  HGBA1C 5.5   CBG: Recent Labs  Lab 03/06/20 0747 03/07/20 0758  GLUCAP 114* 148*   Lipid Profile: No results for input(s): CHOL, HDL, LDLCALC, TRIG, CHOLHDL, LDLDIRECT in the last 72 hours. Thyroid Function Tests: Recent Labs    03/06/20 0628  TSH 1.745   Anemia Panel: No results for input(s): VITAMINB12, FOLATE, FERRITIN, TIBC, IRON, RETICCTPCT in the last 72 hours. Sepsis Labs: Recent Labs  Lab 03/05/20 1815  LATICACIDVEN 1.6    Recent Results (from the past 240 hour(s))  Urine culture     Status: Abnormal (Preliminary result)   Collection Time: 03/05/20  2:09 PM   Specimen: Urine, Clean Catch  Result Value Ref Range Status   Specimen Description   Final    URINE, CLEAN CATCH Performed at [facility], [address], [place], [place] [address]    Special Requests NONE  Final   Culture (A)  Final    >=100,000 COLONIES/mL ESCHERICHIA COLI SUSCEPTIBILITIES TO FOLLOW Performed at [facility], [place] [address], [place], [place] [address]    Report Status PENDING  Incomplete  SARS Coronavirus 2 by RT PCR (hospital order, performed in [facility] hospital lab) Nasopharyngeal Nasopharyngeal Swab     Status: None   Collection Time: 03/05/20  6:15 PM   Specimen: Nasopharyngeal Swab  Result Value Ref Range Status   SARS Coronavirus 2 NEGATIVE NEGATIVE Final    Comment:  (NOTE) SARS-CoV-2 target nucleic acids are NOT DETECTED.  The SARS-CoV-2 RNA is generally detectable in upper and lower respiratory specimens during the acute phase of infection. The lowest concentration of SARS-CoV-2 viral copies this assay can detect is 250 copies / mL. A negative result does not preclude SARS-CoV-2 infection and should not be used as the sole basis for treatment or other patient management decisions.  A negative result may occur with improper specimen collection / handling, submission of specimen other than nasopharyngeal swab, presence of viral mutation(s) within the areas targeted by this assay, and inadequate number of viral copies (<250 copies / mL). A negative result must be combined with clinical observations, patient history, and epidemiological information.  Fact Sheet for Patients:   StrictlyIdeas.no  Fact Sheet for Healthcare Providers: BankingDealers.co.za  This test is not yet approved or  cleared by the [place] FDA and has been authorized for detection and/or diagnosis of SARS-CoV-2 by FDA under an Emergency Use Authorization (EUA).  This EUA will remain in effect (meaning this test can be used) for the duration of the COVID-19 declaration under Section 564(b)(1) of the Act, 21 U.S.C. section 360bbb-3(b)(1), unless the authorization is terminated or revoked sooner.  Performed at [facility], [address], [place], [place] [address]   Blood Culture (routine x 2)     Status: Abnormal (Preliminary result)   Collection Time: 03/05/20  6:15 PM   Specimen: BLOOD  Result Value Ref Range Status   Specimen Description   Final    BLOOD LEFT ASSIST CONTROL Performed at [facility], [address], [place], [place] [address]    Special Requests   Final    BOTTLES DRAWN AEROBIC AND ANAEROBIC Blood Culture results may not be optimal due to an inadequate volume of blood received in  culture bottles Performed at [facility], [address] [place], [place] [address]    Culture  Setup Time   Final    GRAM NEGATIVE RODS IN BOTH AEROBIC AND  ANAEROBIC BOTTLES CRITICAL RESULT CALLED TO, READ BACK BY AND VERIFIED WITH: NATHAN BELUE AT [id number] 03/06/20 SDR    Culture (A)  Final    ESCHERICHIA COLI SUSCEPTIBILITIES TO FOLLOW Performed at [facility], [place] [address] [place], [place] [address]    Report Status PENDING  Incomplete  Blood Culture (routine x 2)     Status: None (Preliminary result)   Collection Time: 03/05/20  6:15 PM   Specimen: BLOOD  Result Value Ref Range Status   Specimen Description   Final    BLOOD RIGHT ASSIST CONTROL Performed at [facility], [address], [place], [place] [address]    Special Requests   Final    BOTTLES DRAWN AEROBIC AND ANAEROBIC Blood Culture results may not be optimal due to an inadequate volume of blood received in culture bottles Performed at [facility], [address], [place], [place] [address]    Culture  Setup Time   Final    GRAM NEGATIVE RODS ANAEROBIC BOTTLE ONLY CRITICAL VALUE NOTED.  VALUE IS CONSISTENT WITH PREVIOUSLY REPORTED AND CALLED VALUE. Performed at [facility], [address], [place], [place] [address]    Culture   Final    Lonell Grandchild NEGATIVE RODS IDENTIFICATION TO FOLLOW Performed at [facility], [place] [address] [place], [place] [address]    Report Status PENDING  Incomplete  Blood Culture ID Panel (Reflexed)     Status: Abnormal   Collection Time: 03/05/20  6:15 PM  Result Value Ref Range Status   Enterococcus faecalis NOT DETECTED NOT DETECTED Final   Enterococcus Faecium NOT DETECTED NOT DETECTED Final   Listeria monocytogenes NOT DETECTED NOT DETECTED Final   Staphylococcus species NOT DETECTED NOT DETECTED Final   Staphylococcus aureus (BCID) NOT DETECTED NOT DETECTED Final   Staphylococcus epidermidis NOT DETECTED NOT DETECTED Final    Staphylococcus lugdunensis NOT DETECTED NOT DETECTED Final   Streptococcus species NOT DETECTED NOT DETECTED Final   Streptococcus agalactiae NOT DETECTED NOT DETECTED Final   Streptococcus pneumoniae NOT DETECTED NOT DETECTED Final   Streptococcus pyogenes NOT DETECTED NOT DETECTED Final   A.calcoaceticus-baumannii NOT DETECTED NOT DETECTED Final   Bacteroides fragilis NOT DETECTED NOT DETECTED Final   Enterobacterales DETECTED (A) NOT DETECTED Final    Comment: Enterobacterales represent a large order of gram negative bacteria, not a single organism. CRITICAL RESULT CALLED TO, READ BACK BY AND VERIFIED WITH:  NATHAN BELUE AT [id number] 03/06/20 SDR    Enterobacter cloacae complex NOT DETECTED NOT DETECTED Final   Escherichia coli DETECTED (A) NOT DETECTED Final    Comment: CRITICAL RESULT CALLED TO, READ BACK BY AND VERIFIED WITH:  NATHAN BELUE AT [id number] 03/06/20 SDR    Klebsiella aerogenes NOT DETECTED NOT DETECTED Final   Klebsiella oxytoca NOT DETECTED NOT DETECTED Final   Klebsiella pneumoniae NOT DETECTED NOT DETECTED Final   Proteus species NOT DETECTED NOT DETECTED Final   Salmonella species NOT DETECTED NOT DETECTED Final   Serratia marcescens NOT DETECTED NOT DETECTED Final   Haemophilus influenzae NOT DETECTED NOT DETECTED Final   Neisseria meningitidis NOT DETECTED NOT DETECTED Final   Pseudomonas aeruginosa NOT DETECTED NOT DETECTED Final   Stenotrophomonas maltophilia NOT DETECTED NOT DETECTED Final   Candida albicans NOT DETECTED NOT DETECTED Final   Candida auris NOT DETECTED NOT DETECTED Final   Candida glabrata NOT DETECTED NOT DETECTED Final   Candida krusei NOT DETECTED NOT DETECTED Final   Candida parapsilosis NOT DETECTED NOT  DETECTED Final   Candida tropicalis NOT DETECTED NOT DETECTED Final   Cryptococcus neoformans/gattii NOT DETECTED NOT DETECTED Final   CTX-M ESBL NOT DETECTED NOT DETECTED Final   Carbapenem resistance IMP NOT DETECTED NOT DETECTED Final    Carbapenem resistance KPC NOT DETECTED NOT DETECTED Final   Carbapenem resistance NDM NOT DETECTED NOT DETECTED Final   Carbapenem resist OXA 48 LIKE NOT DETECTED NOT DETECTED Final   Carbapenem resistance VIM NOT DETECTED NOT DETECTED Final    Comment: Performed at [facility], [address] [place], [place] [address]      Imaging Studies   CT ABDOMEN PELVIS WO CONTRAST  Result Date: 03/05/2020 CLINICAL DATA:  Nausea/vomiting/diarrhea, pain for several days EXAM: CT ABDOMEN AND PELVIS WITHOUT CONTRAST TECHNIQUE: Multidetector CT imaging of the abdomen and pelvis was performed following the standard protocol without IV contrast. COMPARISON:  07/27/2017, 07/19/2019 FINDINGS: Lower chest: No acute pleural or parenchymal lung disease. Unenhanced CT was performed per clinician order. Lack of IV contrast limits sensitivity and specificity, especially for evaluation of abdominal/pelvic solid viscera. Hepatobiliary: No focal liver abnormality is seen. Status post cholecystectomy. No biliary dilatation. Pancreas: Unremarkable. No pancreatic ductal dilatation or surrounding inflammatory changes. Known pancreatic cysts, largest in the uncinate process, seen on previous studies are not well visualized on this unenhanced exam. Spleen: Normal in size without focal abnormality. Adrenals/Urinary Tract: Stable bilateral adrenal glands. Exophytic cyst right kidney unchanged. No urinary tract calculi or obstructive uropathy within either kidney. The bladder is decompressed, which limits its evaluation. Stomach/Bowel: No bowel obstruction or ileus. There is diverticulosis of the descending and sigmoid colon. Wall thickening of the mid sigmoid colon likely reflects postinflammatory scarring. I do not see any signs of active inflammation on this exam. Vascular/Lymphatic: Stable atherosclerosis of the abdominal aorta and its branches. Multiple borderline enlarged retroperitoneal lymph nodes are unchanged and  likely reactive. Largest measures 10 mm in the aortocaval region. Reproductive: Status post hysterectomy. No adnexal masses. Other: No free fluid or free gas. Small fat containing umbilical hernia. Musculoskeletal: No acute or destructive bony lesions. Reconstructed images demonstrate no additional findings. IMPRESSION: 1. No urinary tract calculi or obstructive uropathy. 2. Diverticulosis of the descending and sigmoid colon. Wall thickening of the mid sigmoid colon likely reflects postinflammatory scarring. I do not see any signs of active inflammation on this exam. 3. Known pancreatic cysts, largest in the uncinate process, seen on previous studies are not well visualized on this unenhanced exam. Please refer to MRI discussion 07/19/2019 which recommended 2 year follow-up MRI. 4. Aortic Atherosclerosis (ICD10-I70.0). Electronically Signed   By: Randa Ngo M.D.   On: 03/05/2020 17:47   DG Chest 2 View  Result Date: 03/05/2020 CLINICAL DATA:  Short of breath, wheezing EXAM: CHEST - 2 VIEW COMPARISON:  04/29/2013 FINDINGS: Frontal and lateral views of the chest demonstrate a stable cardiac silhouette. Chronic elevation the right hemidiaphragm. No airspace disease, effusion, or pneumothorax. No acute bony abnormalities. IMPRESSION: 1. No acute intrathoracic process. 2. Chronic elevation of the right hemidiaphragm. Electronically Signed   By: Randa Ngo M.D.   On: 03/05/2020 15:35   DG Chest Portable 1 View  Result Date: 03/05/2020 CLINICAL DATA:  Shortness of breath EXAM: PORTABLE CHEST 1 VIEW COMPARISON:  March 05, 2020 FINDINGS: The heart size and mediastinal contours are unchanged with mild cardiomegaly. There is elevation of the right hemidiaphragm. There is prominence of the central pulmonary vasculature. No pleural effusion. No acute osseous abnormality. IMPRESSION: Mild pulmonary vascular  congestion. Electronically Signed   By: Prudencio Pair M.D.   On: 03/05/2020 21:43     Medications    Scheduled Meds: . allopurinol  100 mg Oral Daily  . budesonide (PULMICORT) nebulizer solution  0.5 mg Nebulization BID  . calcitRIOL  0.25 mcg Oral Daily  . heparin  5,000 Units Subcutaneous Q8H  . ipratropium-albuterol  3 mL Nebulization Q6H  . methylPREDNISolone (SOLU-MEDROL) injection  40 mg Intravenous Daily  . multivitamin-lutein  1 capsule Oral Daily  . pantoprazole  40 mg Oral Daily  . potassium chloride  40 mEq Oral Q4H  . simvastatin  40 mg Oral q1800   Continuous Infusions: . cefTRIAXone (ROCEPHIN)  IV 2 g (03/07/20 1000)       LOS: 2 days    Time spent: 30 minutes with greater than 50% spent at bedside and in coordination of care.    Ezekiel Slocumb, DO [facility]  03/07/2020, 2:16 PM      If 7PM-7AM, please contact night-coverage. How to contact the [facility] Attending or Consulting provider [place] or covering provider during after hours [place], for this patient?    1. Check the care team in [facility] and look for a) attending/consulting TR[facility] provider listed and b) the [facility] team listed 2. Log into [email or web address] and use [place]'s universal password to access. If you do not have the password, please contact the hospital operator. 3. Locate the [facility] provider you are looking for under [facility] and page to a number that you can be directly reached. 4. If you still have difficulty reaching the provider, please page the [facility] (Director on Call) for the Hospitalists listed on amion for assistance.

## 2020-03-07 NOTE — Hospital Course (Signed)
87 y.o. female with medical history of diastolic CHF, COPD not on home O2 , HTN, CKD stage IV, gout, anemia of chronic disease, who presented to the ED from urgent care with complaints of worsening nausea, vomiting, diarrhea and dysuria x 2-3 days. Urgent care referred patient to the ED due to low BP on 90/60 and UA showing infection and labs showing worsened renal function and leukocytosis.  Patient also reported dyspnea worse than baseline, but no hypoxia was noted.

## 2020-03-07 NOTE — TOC Initial Note (Addendum)
Transition of Care [facility]) - Initial/Assessment Note    Patient Details  Name: Gloria Day MRN: [id number] Date of Birth: [birth year]  [facility]) CM/SW Contact:    Gloria Ivan, LCSW Phone Number: 03/07/2020, 2:36 PM  Clinical Narrative:                CSW spoke with patient's daughter Gloria Day. Patient lives with Gloria Day who provides 24/7 supervision. PCP is Dr. Ellison Hughs. Pharmacy is Paediatric nurse in [place] or [employer]. Patient has a w/c, RW, BSC, and shower chair. Daughter provides transportation to appointments. Confirmed home address listed. Gloria Day and patient agreeable to [facility] recommendation. Denied having an agency preference. Referral made to Gloria Day with [facility] for PT, OT, RN, and Aide services.    Expected Discharge Plan: [place] Barriers to Discharge: Continued Medical Work up   Patient Goals and CMS Choice Patient states their goals for this hospitalization and ongoing recovery are:: home with home health CMS Medicare.gov Compare Post Acute Care list provided to:: Patient Represenative (must comment) Choice offered to / list presented to : Adult Children  Expected Discharge Plan and Services Expected Discharge Plan: [place]       Living arrangements for the past 2 months: [place]: PT,OT [place] Agency: [facility] Date [facility] Agency Contacted: 03/07/20   Representative spoke with at [place]: Gloria Day  Prior Living Arrangements/Services Living arrangements for the past 2 months: [place] Lives with:: Adult Children Patient language and need for interpreter reviewed:: Yes Do you feel safe going back to the place where you live?: Yes      Need for Family Participation in Patient Care: Yes (Comment) Care giver support system in place?: Yes (comment) Current home services: DME Criminal Activity/Legal Involvement Pertinent to Current  Situation/Hospitalization: No - Comment as needed  Activities of Daily Living Home Assistive Devices/Equipment: Environmental consultant (specify type),Wheelchair (Has 3 walkers with different wheel counts.) ADL Screening (condition at time of admission) Patient's cognitive ability adequate to safely complete daily activities?: Yes Is the patient deaf or have difficulty hearing?: Yes (Hard of hearing. - no hearing aids) Does the patient have difficulty seeing, even when wearing glasses/contacts?: No Does the patient have difficulty concentrating, remembering, or making decisions?: No Patient able to express need for assistance with ADLs?: Yes Does the patient have difficulty dressing or bathing?: No Independently performs ADLs?: Yes (appropriate for developmental age) Does the patient have difficulty walking or climbing stairs?: Yes Weakness of Legs: Both Weakness of Arms/Hands: Both  Permission Sought/Granted Permission sought to share information with : Chartered certified accountant granted to share information with : Yes, Verbal Permission Granted     Permission granted to share info w AGENCY: HH        Emotional Assessment         Alcohol / Substance Use: Not Applicable Psych Involvement: No (comment)  Admission diagnosis:  Dehydration [E86.0] UTI (urinary tract infection) [N39.0] Nausea vomiting and diarrhea [R11.2, R19.7] Urinary tract infection without hematuria, site unspecified [N39.0] Patient Active Problem List   Diagnosis Date Noted  . Sepsis due to Escherichia coli with acute renal failure without septic shock ([place])   . Acute diastolic CHF (congestive heart failure) ([place])   . Acute kidney injury superimposed on CKD (  HCC)   . Hypokalemia   . Essential hypertension   . Hyperlipidemia   . UTI (urinary tract infection) 03/05/2020  . Leukocytosis 12/18/2017   PCP:  Sofie Hartigan, MD Pharmacy:   [facility] [address], [place] [address] Phone: [phone] Fax: [phone]     Social Determinants of Health (SDOH) Interventions    Readmission Risk Interventions No flowsheet data found.

## 2020-03-07 NOTE — Progress Notes (Signed)
*  PRELIMINARY RESULTS* Echocardiogram 2D Echocardiogram has been performed.  Sherrie Sport 03/07/2020, 8:41 AM

## 2020-03-07 NOTE — Evaluation (Signed)
Occupational Therapy Evaluation Patient Details Name: Gloria Day MRN: [id number] DOB: [birth year] Today's Date: 03/07/2020    History of Present Illness Pt is admitted for UTI with complaints of dysuria and diarrhea x 3 days. History includes CHF, COPD, and HTN. Pt reports increased SOB and wheezing the past few days.   Clinical Impression   [name] was seen for OT evaluation this date. Prior to hospital admission, pt was MOD I for household mobility and requires assist for all I/ADLs. Pt lives with daughter who is available 24/7 in home c ramped entrance. Pt presents to acute OT demonstrating impaired ADL performance and functional mobility 2/2 decreased activity tolerance and functional strength/balance deficits.   Pt currently requires MIN A for BSC t/f, MOD A perihygiene in standing. MIN A for LBD seated EOB - assist for threading over heels. Pt demonstrates increased WOB on 2L [place], resolves with seated rest. Pt would benefit from skilled OT to address noted impairments and functional limitations (see below for any additional details) in order to maximize safety and independence while minimizing falls risk and caregiver burden. Upon hospital discharge, recommend HHOT to maximize pt safety and return to functional independence during meaningful occupations of daily life.     Follow Up Recommendations  Home health OT;Supervision/Assistance - 24 hour    Equipment Recommendations  3 in 1 bedside commode    Recommendations for Other Services       Precautions / Restrictions Precautions Precautions: Fall Restrictions Weight Bearing Restrictions: No      Mobility Bed Mobility Overal bed mobility: Needs Assistance Bed Mobility: Sit to Supine       Sit to supine: Min guard   General bed mobility comments: recevied sitting on EOB. no physical assist to return to bed (set at home bed height)    Transfers Overall transfer level: Needs assistance Equipment used: Rolling walker (2  wheeled) Transfers: Sit to/from [facility] Sit to Stand: Min assist Stand pivot transfers: Min guard       General transfer comment: assist to stabilize RW    Balance Overall balance assessment: Needs assistance Sitting-balance support: Feet supported Sitting balance-Leahy Scale: Good     Standing balance support: Single extremity supported;During functional activity Standing balance-Leahy Scale: Fair                             ADL either performed or assessed with clinical judgement   ADL Overall ADL's : Needs assistance/impaired                                       General ADL Comments: MIN A for BSC t/f, MOD A perihygiene in standing. MIN A for LBD seated EOB - assist for threading over heels.                  Pertinent Vitals/Pain Pain Assessment: No/denies pain     Hand Dominance Right   Extremity/Trunk Assessment Upper Extremity Assessment Upper Extremity Assessment: Generalized weakness   Lower Extremity Assessment Lower Extremity Assessment: Generalized weakness       Communication Communication Communication: No difficulties   Cognition Arousal/Alertness: Awake/alert Behavior During Therapy: WFL for tasks assessed/performed Overall Cognitive Status: Within Functional Limits for tasks assessed  General Comments       Exercises Exercises: Other exercises Other Exercises Other Exercises: Pt educated re: OT role, DME recs, d/c recs, falls prevention, ECS, home/routines modifications Other Exercises: LBD, toileting, sit>sup, sit<>stand, x2, SPT, sitting/standing balance/tolerance   Shoulder Instructions      Home Living Family/patient expects to be discharged to:: Private residence Living Arrangements: Children (daughter) Available Help at Discharge: Family;Available 24 hours/day Type of Home: House Home Access: Ramped entrance     Home  Layout: One level               Home Equipment: Wheelchair - manual (3 wheel walker)          Prior Functioning/Environment Level of Independence: Needs assistance        Comments: pt relies on daughter for assistance with all ADLs and IADLs. Pt reports she is typically limited household ambulator ambulating from bed->bathroom. Able to walk down ramp to exit home.        OT Problem List: Decreased strength;Decreased activity tolerance;Impaired balance (sitting and/or standing);Cardiopulmonary status limiting activity      OT Treatment/Interventions: Self-care/ADL training;Therapeutic exercise;Energy conservation;DME and/or AE instruction;Therapeutic activities;Patient/family education;Balance training    OT Goals(Current goals can be found in the care plan section) Acute Rehab OT Goals Patient Stated Goal: to go home OT Goal Formulation: With patient Time For Goal Achievement: 03/21/20 Potential to Achieve Goals: Good ADL Goals Pt Will Perform Grooming: with modified independence;sitting Pt Will Perform Lower Body Bathing: with supervision;with set-up;sitting/lateral leans Pt Will Transfer to Toilet: with modified independence;ambulating;regular height toilet (c LRAD PRN)  OT Frequency: Min 2X/week    AM-PAC OT "6 Clicks" Daily Activity     Outcome Measure Help from another person eating meals?: A Little Help from another person taking care of personal grooming?: A Little Help from another person toileting, which includes using toliet, bedpan, or urinal?: A Lot Help from another person bathing (including washing, rinsing, drying)?: A Little Help from another person to put on and taking off regular upper body clothing?: A Little Help from another person to put on and taking off regular lower body clothing?: A Little 6 Click Score: 17   End of Session Equipment Utilized During Treatment: Oxygen;Rolling walker Nurse Communication: Mobility status  Activity Tolerance:  Patient tolerated treatment well Patient left: in bed;with call bell/phone within reach;with bed alarm set  OT Visit Diagnosis: Other abnormalities of gait and mobility (R26.89);Muscle weakness (generalized) (M62.81)                Time: 1000-1030 OT Time Calculation (min): 30 min Charges:  OT General Charges $OT Visit: 1 Visit OT Evaluation $OT Eval Moderate Complexity: 1 Mod OT Treatments $Self Care/Home Management : 23-37 mins  Dessie Coma, M.S. OTR/L  03/07/20, 10:53 AM  ascom [phone]

## 2020-03-07 NOTE — Consult Note (Signed)
[place], [place] 03/07/20  Subjective:   Gloria Day is a 87 y.o female with PMHX significant of COPD, CHF per, HTN, CKD stage 4, gout, and anemia of chronic disease. She presents to the ED today with c/o nausea, vomiting, diarrhea, and dysuria for 2-3 days.Per admission notes, she was tacycpneic and dyspneic. Frequent home diarrhea episodes had left dried feces on legs.   Dx was determined UTI with sepsis, gastroenteritis and AKI on CKD stage 4. She currently sees dr Juleen China for CKD management. She is seen this morning while sitting on the side of bed completing her breakfast. She is a poor historian of events. She admits to having shortness of breath and is currently on 2L O2. She is not sure how long she was having diarrhea at home but says she has not had any episodes while admitted. She says she is trying to drink fluids to stay hydrated. She denies chest pain and has minimal swelling in her lower extremities. UOP 1.8L recorded for past 24 hours.     Objective:  Vital signs in last 24 hours:  Temp:  [97.8 F (36.6 C)-99.5 F (37.5 C)] 97.8 F (36.6 C) (02/09 1147) Pulse Rate:  [91-102] 95 (02/09 1147) Resp:  [17-24] 18 (02/09 1147) BP: (100-137)/(56-79) 129/59 (02/09 1147) SpO2:  [93 %-98 %] 96 % (02/09 1147)  Weight change:  Filed Weights   03/05/20 1649  Weight: 95.3 kg    Intake/Output:    Intake/Output Summary (Last 24 hours) at 03/07/2020 1347 Last data filed at 03/07/2020 0603 Gross per 24 hour  Intake 340 ml  Output 1500 ml  Net -1160 ml     Physical Exam: General: Seated side of bed, NAD  HEENT Anicteric, [place]  Pulm/lungs Dyspnea at rest, 2L O2  CVS/Heart No chest pain, S1-S2 present  Abdomen:  Soft, non-tender  Extremities: Trace peripheral edema  Neurologic: Alert, oriented, poor historian  Skin: No masses or rashes  Access: No access       Basic Metabolic Panel:  Recent Labs  Lab 03/05/20 1447 03/06/20 0628  NA  134* 136  K 3.6 3.1*  CL 93* 97*  CO2 22 25  GLUCOSE 147* 120*  BUN 47* 53*  CREATININE 3.21* 3.37*  CALCIUM 8.5* 7.9*     CBC: Recent Labs  Lab 03/05/20 1447 03/05/20 2221 03/06/20 0628  WBC 24.3* 14.8* 17.1*  NEUTROABS 20.7*  --   --   HGB 12.2 11.4* 10.8*  HCT 37.3 35.1* 32.5*  MCV 88.8 89.5 88.8  PLT 148* 113* 124*     No results found for: HEPBSAG, HEPBSAB, HEPBIGM    Microbiology:  Recent Results (from the past 240 hour(s))  Urine culture     Status: Abnormal (Preliminary result)   Collection Time: 03/05/20  2:09 PM   Specimen: Urine, Clean Catch  Result Value Ref Range Status   Specimen Description   Final    URINE, CLEAN CATCH Performed at [facility], [address], [place], [place] [address]    Special Requests NONE  Final   Culture (A)  Final    >=100,000 COLONIES/mL ESCHERICHIA COLI SUSCEPTIBILITIES TO FOLLOW Performed at [facility], [place] [address], [place], [place] [address]    Report Status PENDING  Incomplete  SARS Coronavirus 2 by RT PCR (hospital order, performed in [facility] hospital lab) Nasopharyngeal Nasopharyngeal Swab     Status: None   Collection Time: 03/05/20  6:15 PM  Specimen: Nasopharyngeal Swab  Result Value Ref Range Status   SARS Coronavirus 2 NEGATIVE NEGATIVE Final    Comment: (NOTE) SARS-CoV-2 target nucleic acids are NOT DETECTED.  The SARS-CoV-2 RNA is generally detectable in upper and lower respiratory specimens during the acute phase of infection. The lowest concentration of SARS-CoV-2 viral copies this assay can detect is 250 copies / mL. A negative result does not preclude SARS-CoV-2 infection and should not be used as the sole basis for treatment or other patient management decisions.  A negative result may occur with improper specimen collection / handling, submission of specimen other than nasopharyngeal swab, presence of viral mutation(s) within the areas targeted by this assay, and  inadequate number of viral copies (<250 copies / mL). A negative result must be combined with clinical observations, patient history, and epidemiological information.  Fact Sheet for Patients:   StrictlyIdeas.no  Fact Sheet for Healthcare Providers: BankingDealers.co.za  This test is not yet approved or  cleared by the [place] FDA and has been authorized for detection and/or diagnosis of SARS-CoV-2 by FDA under an Emergency Use Authorization (EUA).  This EUA will remain in effect (meaning this test can be used) for the duration of the COVID-19 declaration under Section 564(b)(1) of the Act, 21 U.S.C. section 360bbb-3(b)(1), unless the authorization is terminated or revoked sooner.  Performed at [facility], [address] [place], [place] [address]   Blood Culture (routine x 2)     Status: Abnormal (Preliminary result)   Collection Time: 03/05/20  6:15 PM   Specimen: BLOOD  Result Value Ref Range Status   Specimen Description   Final    BLOOD LEFT ASSIST CONTROL Performed at [facility], [address] [place], [place] [address]    Special Requests   Final    BOTTLES DRAWN AEROBIC AND ANAEROBIC Blood Culture results may not be optimal due to an inadequate volume of blood received in culture bottles Performed at [facility], [address] [place], [place] [address]    Culture  Setup Time   Final    GRAM NEGATIVE RODS IN BOTH AEROBIC AND ANAEROBIC BOTTLES CRITICAL RESULT CALLED TO, READ BACK BY AND VERIFIED WITH: NATHAN BELUE AT [id number] 03/06/20 SDR    Culture (A)  Final    ESCHERICHIA COLI SUSCEPTIBILITIES TO FOLLOW Performed at [facility], [place] [address], [place], [place] [address]    Report Status PENDING  Incomplete  Blood Culture (routine x 2)     Status: None (Preliminary result)   Collection Time: 03/05/20  6:15 PM   Specimen: BLOOD  Result Value Ref Range Status    Specimen Description   Final    BLOOD RIGHT ASSIST CONTROL Performed at [facility], [address], [place], [place] [address]    Special Requests   Final    BOTTLES DRAWN AEROBIC AND ANAEROBIC Blood Culture results may not be optimal due to an inadequate volume of blood received in culture bottles Performed at [facility], [address] [place], [place] [address]    Culture  Setup Time   Final    GRAM NEGATIVE RODS ANAEROBIC BOTTLE ONLY CRITICAL VALUE NOTED.  VALUE IS CONSISTENT WITH PREVIOUSLY REPORTED AND CALLED VALUE. Performed at [facility], [address] [place], [place] [address]    Culture   Final    Lonell Grandchild NEGATIVE RODS IDENTIFICATION TO FOLLOW Performed at [facility], [place] [address], [place],  [address]  Report Status PENDING  Incomplete  Blood Culture ID Panel (Reflexed)     Status: Abnormal   Collection Time: 03/05/20  6:15 PM  Result Value Ref Range Status   Enterococcus faecalis NOT DETECTED NOT DETECTED Final   Enterococcus Faecium NOT DETECTED NOT DETECTED Final   Listeria monocytogenes NOT DETECTED NOT DETECTED Final   Staphylococcus species NOT DETECTED NOT DETECTED Final   Staphylococcus aureus (BCID) NOT DETECTED NOT DETECTED Final   Staphylococcus epidermidis NOT DETECTED NOT DETECTED Final   Staphylococcus lugdunensis NOT DETECTED NOT DETECTED Final   Streptococcus species NOT DETECTED NOT DETECTED Final   Streptococcus agalactiae NOT DETECTED NOT DETECTED Final   Streptococcus pneumoniae NOT DETECTED NOT DETECTED Final   Streptococcus pyogenes NOT DETECTED NOT DETECTED Final   A.calcoaceticus-baumannii NOT DETECTED NOT DETECTED Final   Bacteroides fragilis NOT DETECTED NOT DETECTED Final   Enterobacterales DETECTED (A) NOT DETECTED Final    Comment: Enterobacterales represent a large order of gram negative bacteria, not a single organism. CRITICAL RESULT CALLED TO, READ BACK BY AND VERIFIED WITH:   NATHAN BELUE AT [id number] 03/06/20 SDR    Enterobacter cloacae complex NOT DETECTED NOT DETECTED Final   Escherichia coli DETECTED (A) NOT DETECTED Final    Comment: CRITICAL RESULT CALLED TO, READ BACK BY AND VERIFIED WITH:  NATHAN BELUE AT [id number] 03/06/20 SDR    Klebsiella aerogenes NOT DETECTED NOT DETECTED Final   Klebsiella oxytoca NOT DETECTED NOT DETECTED Final   Klebsiella pneumoniae NOT DETECTED NOT DETECTED Final   Proteus species NOT DETECTED NOT DETECTED Final   Salmonella species NOT DETECTED NOT DETECTED Final   Serratia marcescens NOT DETECTED NOT DETECTED Final   Haemophilus influenzae NOT DETECTED NOT DETECTED Final   Neisseria meningitidis NOT DETECTED NOT DETECTED Final   Pseudomonas aeruginosa NOT DETECTED NOT DETECTED Final   Stenotrophomonas maltophilia NOT DETECTED NOT DETECTED Final   Candida albicans NOT DETECTED NOT DETECTED Final   Candida auris NOT DETECTED NOT DETECTED Final   Candida glabrata NOT DETECTED NOT DETECTED Final   Candida krusei NOT DETECTED NOT DETECTED Final   Candida parapsilosis NOT DETECTED NOT DETECTED Final   Candida tropicalis NOT DETECTED NOT DETECTED Final   Cryptococcus neoformans/gattii NOT DETECTED NOT DETECTED Final   CTX-M ESBL NOT DETECTED NOT DETECTED Final   Carbapenem resistance IMP NOT DETECTED NOT DETECTED Final   Carbapenem resistance KPC NOT DETECTED NOT DETECTED Final   Carbapenem resistance NDM NOT DETECTED NOT DETECTED Final   Carbapenem resist OXA 48 LIKE NOT DETECTED NOT DETECTED Final   Carbapenem resistance VIM NOT DETECTED NOT DETECTED Final    Comment: Performed at [facility], [address] [place], [place] [address]    Coagulation Studies: No results for input(s): LABPROT, INR in the last 72 hours.  Urinalysis: Recent Labs    03/05/20 1409  COLORURINE ORANGE*  LABSPEC 1.020  PHURINE 6.0  GLUCOSEU 100*  HGBUR LARGE*  BILIRUBINUR MODERATE*  KETONESUR 15*  PROTEINUR >300*  NITRITE POSITIVE*   LEUKOCYTESUR LARGE*      Imaging: CT ABDOMEN PELVIS WO CONTRAST  Result Date: 03/05/2020 CLINICAL DATA:  Nausea/vomiting/diarrhea, pain for several days EXAM: CT ABDOMEN AND PELVIS WITHOUT CONTRAST TECHNIQUE: Multidetector CT imaging of the abdomen and pelvis was performed following the standard protocol without IV contrast. COMPARISON:  07/27/2017, 07/19/2019 FINDINGS: Lower chest: No acute pleural or parenchymal lung disease. Unenhanced CT was performed per clinician order. Lack of IV contrast limits sensitivity and specificity, especially for evaluation of  abdominal/pelvic solid viscera. Hepatobiliary: No focal liver abnormality is seen. Status post cholecystectomy. No biliary dilatation. Pancreas: Unremarkable. No pancreatic ductal dilatation or surrounding inflammatory changes. Known pancreatic cysts, largest in the uncinate process, seen on previous studies are not well visualized on this unenhanced exam. Spleen: Normal in size without focal abnormality. Adrenals/Urinary Tract: Stable bilateral adrenal glands. Exophytic cyst right kidney unchanged. No urinary tract calculi or obstructive uropathy within either kidney. The bladder is decompressed, which limits its evaluation. Stomach/Bowel: No bowel obstruction or ileus. There is diverticulosis of the descending and sigmoid colon. Wall thickening of the mid sigmoid colon likely reflects postinflammatory scarring. I do not see any signs of active inflammation on this exam. Vascular/Lymphatic: Stable atherosclerosis of the abdominal aorta and its branches. Multiple borderline enlarged retroperitoneal lymph nodes are unchanged and likely reactive. Largest measures 10 mm in the aortocaval region. Reproductive: Status post hysterectomy. No adnexal masses. Other: No free fluid or free gas. Small fat containing umbilical hernia. Musculoskeletal: No acute or destructive bony lesions. Reconstructed images demonstrate no additional findings. IMPRESSION: 1. No  urinary tract calculi or obstructive uropathy. 2. Diverticulosis of the descending and sigmoid colon. Wall thickening of the mid sigmoid colon likely reflects postinflammatory scarring. I do not see any signs of active inflammation on this exam. 3. Known pancreatic cysts, largest in the uncinate process, seen on previous studies are not well visualized on this unenhanced exam. Please refer to MRI discussion 07/19/2019 which recommended 2 year follow-up MRI. 4. Aortic Atherosclerosis (ICD10-I70.0). Electronically Signed   By: Randa Ngo M.D.   On: 03/05/2020 17:47   DG Chest 2 View  Result Date: 03/05/2020 CLINICAL DATA:  Short of breath, wheezing EXAM: CHEST - 2 VIEW COMPARISON:  04/29/2013 FINDINGS: Frontal and lateral views of the chest demonstrate a stable cardiac silhouette. Chronic elevation the right hemidiaphragm. No airspace disease, effusion, or pneumothorax. No acute bony abnormalities. IMPRESSION: 1. No acute intrathoracic process. 2. Chronic elevation of the right hemidiaphragm. Electronically Signed   By: Randa Ngo M.D.   On: 03/05/2020 15:35   DG Chest Portable 1 View  Result Date: 03/05/2020 CLINICAL DATA:  Shortness of breath EXAM: PORTABLE CHEST 1 VIEW COMPARISON:  March 05, 2020 FINDINGS: The heart size and mediastinal contours are unchanged with mild cardiomegaly. There is elevation of the right hemidiaphragm. There is prominence of the central pulmonary vasculature. No pleural effusion. No acute osseous abnormality. IMPRESSION: Mild pulmonary vascular congestion. Electronically Signed   By: Prudencio Pair M.D.   On: 03/05/2020 21:43     Medications:   . cefTRIAXone (ROCEPHIN)  IV 2 g (03/07/20 1000)   . allopurinol  100 mg Oral Daily  . budesonide (PULMICORT) nebulizer solution  0.5 mg Nebulization BID  . calcitRIOL  0.25 mcg Oral Daily  . heparin  5,000 Units Subcutaneous Q8H  . ipratropium-albuterol  3 mL Nebulization Q6H  . methylPREDNISolone (SOLU-MEDROL) injection   40 mg Intravenous Daily  . multivitamin-lutein  1 capsule Oral Daily  . pantoprazole  40 mg Oral Daily  . potassium chloride  40 mEq Oral Q4H  . simvastatin  40 mg Oral q1800   acetaminophen **OR** acetaminophen, albuterol, ondansetron **OR** ondansetron (ZOFRAN) IV  Assessment/ Plan:  87 y.o. female with  was admitted on 03/05/2020 for  Active Problems:   UTI (urinary tract infection)   Sepsis due to Escherichia coli with acute renal failure without septic shock (HCC)   Acute diastolic CHF (congestive heart failure) (HCC)   Acute kidney injury  superimposed on CKD ([place])   Hypokalemia   Essential hypertension   Hyperlipidemia  Dehydration [E86.0] UTI (urinary tract infection) [N39.0] Nausea vomiting and diarrhea [R11.2, R19.7] Urinary tract infection without hematuria, site unspecified [N39.0]  #. Acute on chronic kidney disease stage 4 Likely due to fluid loss and dehydration related to diarrhea and dyspnea Encouraged to drink small amounts of fluids frequently to maintain hydration. Will order renal ultrasound to evaluate for obstruction.  Creatinine elevated and GFR decreased in current setting. Given 1 dose of Lasix last evening Will monitor labs and review results of ordered test.  Awaiting ECHO results  #. UTI with sepsis Currently on antibiotics Primary team will manage  #. Gastroenteritis C-diff ordered, not yet collected Antiemetics ordered by primary team Able to tolerate solids with breakfast.  Primary team will follow  Thank you for consult  Colon Flattery 2/9/20221:47 [facility] [place], [place]

## 2020-03-08 ENCOUNTER — Inpatient Hospital Stay: Payer: Medicare HMO

## 2020-03-08 DIAGNOSIS — I10 Essential (primary) hypertension: Secondary | ICD-10-CM | POA: Diagnosis not present

## 2020-03-08 DIAGNOSIS — N179 Acute kidney failure, unspecified: Secondary | ICD-10-CM | POA: Diagnosis not present

## 2020-03-08 DIAGNOSIS — N3001 Acute cystitis with hematuria: Secondary | ICD-10-CM | POA: Diagnosis not present

## 2020-03-08 DIAGNOSIS — A4151 Sepsis due to Escherichia coli [E. coli]: Secondary | ICD-10-CM | POA: Diagnosis not present

## 2020-03-08 LAB — CULTURE, BLOOD (ROUTINE X 2)

## 2020-03-08 LAB — CBC WITH DIFFERENTIAL/PLATELET
Abs Immature Granulocytes: 0.52 10*3/uL — ABNORMAL HIGH (ref 0.00–0.07)
Basophils Absolute: 0 10*3/uL (ref 0.0–0.1)
Basophils Relative: 0 %
Eosinophils Absolute: 0 10*3/uL (ref 0.0–0.5)
Eosinophils Relative: 0 %
HCT: 30.4 % — ABNORMAL LOW (ref 36.0–46.0)
Hemoglobin: 9.8 g/dL — ABNORMAL LOW (ref 12.0–15.0)
Immature Granulocytes: 3 %
Lymphocytes Relative: 4 %
Lymphs Abs: 0.6 10*3/uL — ABNORMAL LOW (ref 0.7–4.0)
MCH: 28.9 pg (ref 26.0–34.0)
MCHC: 32.2 g/dL (ref 30.0–36.0)
MCV: 89.7 fL (ref 80.0–100.0)
Monocytes Absolute: 0.5 10*3/uL (ref 0.1–1.0)
Monocytes Relative: 3 %
Neutro Abs: 14.8 10*3/uL — ABNORMAL HIGH (ref 1.7–7.7)
Neutrophils Relative %: 90 %
Platelets: 139 10*3/uL — ABNORMAL LOW (ref 150–400)
RBC: 3.39 MIL/uL — ABNORMAL LOW (ref 3.87–5.11)
RDW: 13.6 % (ref 11.5–15.5)
WBC: 16.5 10*3/uL — ABNORMAL HIGH (ref 4.0–10.5)
nRBC: 0 % (ref 0.0–0.2)

## 2020-03-08 LAB — BASIC METABOLIC PANEL
Anion gap: 12 (ref 5–15)
BUN: 70 mg/dL — ABNORMAL HIGH (ref 8–23)
CO2: 26 mmol/L (ref 22–32)
Calcium: 8.3 mg/dL — ABNORMAL LOW (ref 8.9–10.3)
Chloride: 101 mmol/L (ref 98–111)
Creatinine, Ser: 3.11 mg/dL — ABNORMAL HIGH (ref 0.44–1.00)
GFR, Estimated: 14 mL/min — ABNORMAL LOW (ref >60)
Glucose, Bld: 152 mg/dL — ABNORMAL HIGH (ref 70–99)
Potassium: 4.3 mmol/L (ref 3.5–5.1)
Sodium: 139 mmol/L (ref 135–145)

## 2020-03-08 LAB — ECHOCARDIOGRAM COMPLETE
AR max vel: 2.25 cm2
AV Area VTI: 2.9 cm2
AV Area mean vel: 2.33 cm2
AV Mean grad: 5 mmHg
AV Peak grad: 8.4 mmHg
Ao pk vel: 1.45 m/s
Area-P 1/2: 4.49 cm2
Height: 64 in
S' Lateral: 2.37 cm
Weight: 3359.99 oz

## 2020-03-08 LAB — URINE CULTURE: Culture: >=100000 — AB

## 2020-03-08 LAB — GLUCOSE, CAPILLARY: Glucose-Capillary: 137 mg/dL — ABNORMAL HIGH (ref 70–99)

## 2020-03-08 LAB — MAGNESIUM: Magnesium: 1.7 mg/dL (ref 1.7–2.4)

## 2020-03-08 MED ORDER — SODIUM CHLORIDE 0.9 % IV SOLN: INTRAVENOUS | Status: DC

## 2020-03-08 MED ORDER — DILTIAZEM HCL ER COATED BEADS 180 MG PO CP24
180.0000 mg | ORAL_CAPSULE | Freq: Every day | ORAL | Status: DC
  Administered 2020-03-08 – 2020-03-11 (×4): 180 mg via ORAL
  Filled 2020-03-08 (×4): qty 1

## 2020-03-08 MED ORDER — CEFAZOLIN SODIUM-DEXTROSE 1-4 GM/50ML-% IV SOLN
1.0000 g | Freq: Two times a day (BID) | INTRAVENOUS | Status: DC
  Administered 2020-03-09: 1 g via INTRAVENOUS
  Filled 2020-03-08 (×2): qty 50

## 2020-03-08 MED ORDER — ATORVASTATIN CALCIUM 20 MG PO TABS
20.0000 mg | ORAL_TABLET | Freq: Every evening | ORAL | Status: DC
  Administered 2020-03-08 – 2020-03-13 (×6): 20 mg via ORAL
  Filled 2020-03-08 (×7): qty 1

## 2020-03-08 NOTE — Plan of Care (Signed)

## 2020-03-08 NOTE — Progress Notes (Signed)
PT Cancellation Note  Patient Details Name: NYANZA KINDSCHI MRN: [id number] DOB: 1928/10/21   Cancelled Treatment:    Reason Eval/Treat Not Completed: Patient declined, no reason specified.  Pt requested not to be seen today even with maximal encouragement for therapy.  Pt will be attempted to be seen at later date/time as medically appropriate.   Gwenlyn Saran, PT, DPT 03/08/20, 3:14 PM

## 2020-03-08 NOTE — Progress Notes (Signed)
PHARMACIST - PHYSICIAN ORDER COMMUNICATION  CONCERNING: Diltiazem and Simvastatin  and risk of rhabdomyolysis  DESCRIPTION:  Patients on diltiazem and simvastatin >10 mg/day have reported cases of rhabdomyolysis. Pharmacy is to assess simvastatin dose. If >10 mg, substitute atorvastatin (Lipitor) '1mg'$  for each '2mg'$  simvastatin.  This patient is ordered simvastatin '40mg'$  and diltiazem  '180mg'$  CD.    ACTION TAKEN: Per protocol pharmacy has discontinued the patient's order for simvastatin and replaced it with Atorvastatin '20mg'$ .   Pernell Dupre, PharmD, BCPS Clinical Pharmacist 03/08/2020 12:33 PM

## 2020-03-08 NOTE — Care Management Important Message (Signed)
Important Message  Patient Details  Name: Gloria Day MRN: [id number] Date of Birth: 1928/10/13   Medicare Important Message Given:  Yes     Dannette Barbara 03/08/2020, 1:59 PM

## 2020-03-08 NOTE — Progress Notes (Addendum)
PROGRESS NOTE    Gloria Day   [id number]  DOB: 1928/06/28  PCP: Sofie Hartigan, MD    DOA: 03/05/2020 LOS: 3   Brief Narrative   87 y.o. female with medical history of diastolic CHF, COPD not on home O2 , HTN, CKD stage IV, gout, anemia of chronic disease, who presented to the ED from urgent care with complaints of worsening nausea, vomiting, diarrhea and dysuria x 2-3 days. Urgent care referred patient to the ED due to low BP on 90/60 and UA showing infection and labs showing worsened renal function and leukocytosis.  Patient also reported dyspnea worse than baseline, but no hypoxia was noted.      Significant Events: - Admitted 03/05/2020  Assessment & Plan   Active Problems:   UTI (urinary tract infection)   Sepsis due to Escherichia coli with acute renal failure without septic shock (HCC)   Acute diastolic CHF (congestive heart failure) (HCC)   Acute kidney injury superimposed on CKD ([place])   Hypokalemia   Essential hypertension   Hyperlipidemia   Severe sepsis due to E. coli bacteremia and UTI -presented with severe sepsis as evidenced by leukocytosis, tachycardia, fever in the setting of UTI.  AKI superimposed on CKD stage IV reflects organ dysfunction consistent with severe sepsis.  Sepsis physiology improved. Abx: Rocephin >> Ancef --Change antibiotic to Ancef based on culture, Keflex at d/c  --Complete 7 day course antibiotics given uncomplicated GNR bacteremia --Follow repeat cultures - neg to date  Nausea vomiting -reported on admission, improved.  Likely secondary to above.  Antiemetics as needed.  Acute on chronic diastolic CHF -IV fluids previously held.  Patient appears euvolemic on exam.   Echo - EF 70-75% hyperdynamic LVEF systolic function, grade 1 diastolic dysfunction. --Monitor volume status closely, on fluids for AKI as below  Acute respiratory failure with hypoxia - pt not on supplemental oxygen at baseline, requiring 2 L/min currently,  along with  subjective shortness of breath.   Pt has elevated R hemidiaphragm, and not mobilizing therefore atelectasis contributes to hypoxia.  No sign of PNA on CXR.  Doubt PE without tachycardia or hemodynamic changes; dyspnea also is stable. 2/10 repeat Chest xray improved vascular congestion --wean oxygen as tolerated, supplement O2 to keep sats >=88%  COPD with mild acute exacerbation - had mild wheezing 2/8, treated with IV steroid, exacerbation resolved.  Per chart review, appears pt used to be prescribed inhalers but stopped them on her own as she did not feel they were helpful. --d/w pt and daughter re initiating maintenance inhalers at d/c --PRN albuterol for now --d/c IV steroid for now and monitor --Duonebs q6h, Pulmicort nebs BID  AKI superimposed on CKD stage IV -patient received some IV fluids on arrival.  Was subsequently placed on oxygen so fluids were stopped.  Nephrology is consulted.  Monitor BMP.  2/10 - resumed on some IV fluids since EF normal.  Hold Lasix.  Hypokalemia -potassium was replaced.  Monitor and replace as needed.  Diarrhea -present on admission, unclear etiology.  Stool studies were ordered and pending. Patient denies further episodes of diarrhea since admission.  Monitor.  Generalized weakness-evaluated by physical therapy and home health PT with 24-hour supervision was recommended.  TOC following for disposition planning.  Hyperlipidemia -continue Zocor  Essential hypertension -antihypertensive medications initially held due to soft BP.  BP's elevated today.  Resume Cardizem.  Hold Lasix.  Monitor BP and resume when indicated.   Obesity: Body mass index is 36.05 kg/m.  Complicates overall care and prognosis.  Recommend physical activity and diet efforts towards weight loss.  Primary care follow-up.  DVT prophylaxis: heparin injection 5,000 Units Start: 03/05/20 2215   Diet:  Diet Orders (From admission, onward)     Start     Ordered    03/06/20 1149  Diet Heart Room service appropriate? Yes; Fluid consistency: Thin  Diet effective now       Question Answer Comment  Room service appropriate? Yes   Fluid consistency: Thin      03/06/20 1149              Code Status: DNR    Subjective 03/08/20    Patient reports feeling well today.  Has her baseline intermittent coughing.  Does have postnasal drip that seems to cause.  Says her usual SOB is no worse than normal.  No acute complaints.    Disposition Plan & Communication   Status is: Inpatient  Remains inpatient appropriate because:IV treatments appropriate due to intensity of illness or inability to take PO.   Persistent acute on chronic renal failure, on IV fluids, requires close monitoring.   Dispo: The patient is from: Home              Anticipated d/c is to: Home              Anticipated d/c date is: ~2 days              Patient currently is not medically stable to d/c.   Difficult to place patient No  Family communication - daughter at bedside on rounds today   Consults, Procedures, Significant Events   Consultants:   Nephrology  Procedures:   none  Antimicrobials:  Anti-infectives (From admission, onward)    Start     Dose/Rate Route Frequency Ordered Stop   03/09/20 0800  ceFAZolin (ANCEF) IVPB 1 g/50 mL premix        1 g 100 mL/hr over 30 Minutes Intravenous Every 12 hours 03/08/20 0932     03/06/20 1000  cefTRIAXone (ROCEPHIN) 2 g in sodium chloride 0.9 % 100 mL IVPB        2 g 200 mL/hr over 30 Minutes Intravenous Every 24 hours 03/06/20 0745 03/08/20 0936   03/05/20 1730  cefTRIAXone (ROCEPHIN) 1 g in sodium chloride 0.9 % 100 mL IVPB  Status:  Discontinued        1 g 200 mL/hr over 30 Minutes Intravenous Every 24 hours 03/05/20 1716 03/06/20 0745         Objective   Vitals:   03/08/20 0528 03/08/20 0801 03/08/20 1208 03/08/20 1531  BP: (!) 142/74 (!) 144/70 (!) 149/76 (!) 157/83  Pulse: 90 95 91 91  Resp: '18 18 18 18   '$ Temp: 97.9 F (36.6 C) (!) 97.5 F (36.4 C) 97.8 F (36.6 C) 97.9 F (36.6 C)  TempSrc:      SpO2: (!) 83% 96% 90% 92%  Weight:      Height:       No intake or output data in the 24 hours ending 03/08/20 1941 Filed Weights   03/05/20 1649  Weight: 95.3 kg    Physical Exam:  General exam: awake, alert, no acute distress Respiratory system: CTAB, no wheezes, rales or rhonchi, normal respiratory effort. Cardiovascular system: normal S1/S2, RRR, no pedal edema.   Gastrointestinal system: soft, NT, ND Central nervous system: alert, no gross focal neurologic deficits, normal speech Skin: dry, intact    Labs  Data Reviewed: I have personally reviewed following labs and imaging studies  CBC: Recent Labs  Lab 03/05/20 1447 03/05/20 2221 03/06/20 0628 03/08/20 0705  WBC 24.3* 14.8* 17.1* 16.5*  NEUTROABS 20.7*  --   --  14.8*  HGB 12.2 11.4* 10.8* 9.8*  HCT 37.3 35.1* 32.5* 30.4*  MCV 88.8 89.5 88.8 89.7  PLT 148* 113* 124* [id number]*   Basic Metabolic Panel: Recent Labs  Lab 03/05/20 1447 03/06/20 0628 03/08/20 0705  NA 134* 136 139  K 3.6 3.1* 4.3  CL 93* 97* 101  CO2 '22 25 26  '$ GLUCOSE 147* 120* 152*  BUN 47* 53* 70*  CREATININE 3.21* 3.37* 3.11*  CALCIUM 8.5* 7.9* 8.3*  MG  --   --  1.7   GFR: Estimated Creatinine Clearance: 13.7 mL/min (A) (by C-G formula based on SCr of 3.11 mg/dL (H)). Liver Function Tests: Recent Labs  Lab 03/05/20 1447 03/06/20 0628  AST 37 26  ALT 22 12  ALKPHOS 111 103  BILITOT 1.4* 1.2  PROT 6.3* 5.4*  ALBUMIN 3.2* 2.7*   No results for input(s): LIPASE, AMYLASE in the last 168 hours. No results for input(s): AMMONIA in the last 168 hours. Coagulation Profile: No results for input(s): INR, PROTIME in the last 168 hours. Cardiac Enzymes: No results for input(s): CKTOTAL, CKMB, CKMBINDEX, TROPONINI in the last 168 hours. BNP (last 3 results) No results for input(s): PROBNP in the last 8760 hours. HbA1C: Recent Labs     03/06/20 0628  HGBA1C 5.5   CBG: Recent Labs  Lab 03/06/20 0747 03/07/20 0758 03/08/20 0802  GLUCAP 114* 148* 137*   Lipid Profile: No results for input(s): CHOL, HDL, LDLCALC, TRIG, CHOLHDL, LDLDIRECT in the last 72 hours. Thyroid Function Tests: Recent Labs    03/06/20 0628  TSH 1.745   Anemia Panel: No results for input(s): VITAMINB12, FOLATE, FERRITIN, TIBC, IRON, RETICCTPCT in the last 72 hours. Sepsis Labs: Recent Labs  Lab 03/05/20 1815  LATICACIDVEN 1.6    Recent Results (from the past 240 hour(s))  Urine culture     Status: Abnormal   Collection Time: 03/05/20  2:09 PM   Specimen: Urine, Clean Catch  Result Value Ref Range Status   Specimen Description   Final    URINE, CLEAN CATCH Performed at [facility], [address], [place], [place] [address]    Special Requests   Final    NONE Performed at [facility], [place] [address] [place], [place] [address]    Culture >=100,000 COLONIES/mL ESCHERICHIA COLI (A)  Final   Report Status 03/08/2020 FINAL  Final   Organism ID, Bacteria ESCHERICHIA COLI (A)  Final      Susceptibility   Escherichia coli - MIC*    AMPICILLIN >=32 RESISTANT Resistant     CEFAZOLIN <=4 SENSITIVE Sensitive     CEFEPIME <=0.12 SENSITIVE Sensitive     CEFTRIAXONE <=0.25 SENSITIVE Sensitive     CIPROFLOXACIN <=0.25 SENSITIVE Sensitive     GENTAMICIN <=1 SENSITIVE Sensitive     IMIPENEM <=0.25 SENSITIVE Sensitive     NITROFURANTOIN <=16 SENSITIVE Sensitive     TRIMETH/SULFA <=20 SENSITIVE Sensitive     AMPICILLIN/SULBACTAM >=32 RESISTANT Resistant     PIP/TAZO <=4 SENSITIVE Sensitive     * >=100,000 COLONIES/mL ESCHERICHIA COLI  SARS Coronavirus 2 by RT PCR (hospital order, performed in [place] hospital lab) Nasopharyngeal Nasopharyngeal Swab     Status: None   Collection Time: 03/05/20  6:15 PM  Specimen: Nasopharyngeal Swab  Result Value Ref Range Status   SARS Coronavirus 2 NEGATIVE NEGATIVE Final     Comment: (NOTE) SARS-CoV-2 target nucleic acids are NOT DETECTED.  The SARS-CoV-2 RNA is generally detectable in upper and lower respiratory specimens during the acute phase of infection. The lowest concentration of SARS-CoV-2 viral copies this assay can detect is 250 copies / mL. A negative result does not preclude SARS-CoV-2 infection and should not be used as the sole basis for treatment or other patient management decisions.  A negative result may occur with improper specimen collection / handling, submission of specimen other than nasopharyngeal swab, presence of viral mutation(s) within the areas targeted by this assay, and inadequate number of viral copies (<250 copies / mL). A negative result must be combined with clinical observations, patient history, and epidemiological information.  Fact Sheet for Patients:   StrictlyIdeas.no  Fact Sheet for Healthcare Providers: BankingDealers.co.za  This test is not yet approved or  cleared by the [place] FDA and has been authorized for detection and/or diagnosis of SARS-CoV-2 by FDA under an Emergency Use Authorization (EUA).  This EUA will remain in effect (meaning this test can be used) for the duration of the COVID-19 declaration under Section 564(b)(1) of the Act, 21 U.S.C. section 360bbb-3(b)(1), unless the authorization is terminated or revoked sooner.  Performed at [facility], [address] [place], [place] [address]   Blood Culture (routine x 2)     Status: Abnormal   Collection Time: 03/05/20  6:15 PM   Specimen: BLOOD  Result Value Ref Range Status   Specimen Description   Final    BLOOD LEFT ASSIST CONTROL Performed at [facility], [address] [place], [place] [address]    Special Requests   Final    BOTTLES DRAWN AEROBIC AND ANAEROBIC Blood Culture results may not be optimal due to an inadequate volume of blood received in culture  bottles Performed at [facility], [address], [place], [place] [address]    Culture  Setup Time   Final    GRAM NEGATIVE RODS IN BOTH AEROBIC AND ANAEROBIC BOTTLES CRITICAL RESULT CALLED TO, READ BACK BY AND VERIFIED WITHLloyd Huger AT [id number] 03/06/20 SDR Performed at [facility], [place] [address], [place], [place] [address]    Culture ESCHERICHIA COLI (A)  Final   Report Status 03/08/2020 FINAL  Final   Organism ID, Bacteria ESCHERICHIA COLI  Final      Susceptibility   Escherichia coli - MIC*    AMPICILLIN >=32 RESISTANT Resistant     CEFAZOLIN <=4 SENSITIVE Sensitive     CEFEPIME <=0.12 SENSITIVE Sensitive     CEFTAZIDIME <=1 SENSITIVE Sensitive     CEFTRIAXONE <=0.25 SENSITIVE Sensitive     CIPROFLOXACIN <=0.25 SENSITIVE Sensitive     GENTAMICIN <=1 SENSITIVE Sensitive     IMIPENEM <=0.25 SENSITIVE Sensitive     TRIMETH/SULFA <=20 SENSITIVE Sensitive     AMPICILLIN/SULBACTAM >=32 RESISTANT Resistant     PIP/TAZO <=4 SENSITIVE Sensitive     * ESCHERICHIA COLI  Blood Culture (routine x 2)     Status: Abnormal   Collection Time: 03/05/20  6:15 PM   Specimen: BLOOD  Result Value Ref Range Status   Specimen Description   Final    BLOOD RIGHT ASSIST CONTROL Performed at [facility], [address] [place], [place] [address]    Special Requests   Final    BOTTLES DRAWN AEROBIC AND  ANAEROBIC Blood Culture results may not be optimal due to an inadequate volume of blood received in culture bottles Performed at [facility], [address] [place], [place] [address]    Culture  Setup Time   Final    GRAM NEGATIVE RODS IN BOTH AEROBIC AND ANAEROBIC BOTTLES CRITICAL VALUE NOTED.  VALUE IS CONSISTENT WITH PREVIOUSLY REPORTED AND CALLED VALUE. Performed at [facility] [address] [place], [place] [address]    Culture (A)  Final    ESCHERICHIA COLI SUSCEPTIBILITIES PERFORMED ON PREVIOUS CULTURE WITHIN THE LAST 5  DAYS. Performed at [facility], [place] [address] [place], [place] [address]    Report Status 2020/04/06 FINAL  Final  Blood Culture ID Panel (Reflexed)     Status: Abnormal   Collection Time: 03/05/20  6:15 PM  Result Value Ref Range Status   Enterococcus faecalis NOT DETECTED NOT DETECTED Final   Enterococcus Faecium NOT DETECTED NOT DETECTED Final   Listeria monocytogenes NOT DETECTED NOT DETECTED Final   Staphylococcus species NOT DETECTED NOT DETECTED Final   Staphylococcus aureus (BCID) NOT DETECTED NOT DETECTED Final   Staphylococcus epidermidis NOT DETECTED NOT DETECTED Final   Staphylococcus lugdunensis NOT DETECTED NOT DETECTED Final   Streptococcus species NOT DETECTED NOT DETECTED Final   Streptococcus agalactiae NOT DETECTED NOT DETECTED Final   Streptococcus pneumoniae NOT DETECTED NOT DETECTED Final   Streptococcus pyogenes NOT DETECTED NOT DETECTED Final   A.calcoaceticus-baumannii NOT DETECTED NOT DETECTED Final   Bacteroides fragilis NOT DETECTED NOT DETECTED Final   Enterobacterales DETECTED (A) NOT DETECTED Final    Comment: Enterobacterales represent a large order of gram negative bacteria, not a single organism. CRITICAL RESULT CALLED TO, READ BACK BY AND VERIFIED WITH:  NATHAN BELUE AT [id number] 03/06/20 SDR    Enterobacter cloacae complex NOT DETECTED NOT DETECTED Final   Escherichia coli DETECTED (A) NOT DETECTED Final    Comment: CRITICAL RESULT CALLED TO, READ BACK BY AND VERIFIED WITH:  NATHAN BELUE AT [id number] 03/06/20 SDR    Klebsiella aerogenes NOT DETECTED NOT DETECTED Final   Klebsiella oxytoca NOT DETECTED NOT DETECTED Final   Klebsiella pneumoniae NOT DETECTED NOT DETECTED Final   Proteus species NOT DETECTED NOT DETECTED Final   Salmonella species NOT DETECTED NOT DETECTED Final   Serratia marcescens NOT DETECTED NOT DETECTED Final   Haemophilus influenzae NOT DETECTED NOT DETECTED Final   Neisseria meningitidis NOT DETECTED NOT DETECTED Final    Pseudomonas aeruginosa NOT DETECTED NOT DETECTED Final   Stenotrophomonas maltophilia NOT DETECTED NOT DETECTED Final   Candida albicans NOT DETECTED NOT DETECTED Final   Candida auris NOT DETECTED NOT DETECTED Final   Candida glabrata NOT DETECTED NOT DETECTED Final   Candida krusei NOT DETECTED NOT DETECTED Final   Candida parapsilosis NOT DETECTED NOT DETECTED Final   Candida tropicalis NOT DETECTED NOT DETECTED Final   Cryptococcus neoformans/gattii NOT DETECTED NOT DETECTED Final   CTX-M ESBL NOT DETECTED NOT DETECTED Final   Carbapenem resistance IMP NOT DETECTED NOT DETECTED Final   Carbapenem resistance KPC NOT DETECTED NOT DETECTED Final   Carbapenem resistance NDM NOT DETECTED NOT DETECTED Final   Carbapenem resist OXA 48 LIKE NOT DETECTED NOT DETECTED Final   Carbapenem resistance VIM NOT DETECTED NOT DETECTED Final    Comment: Performed at [facility], [address], [place],  [address]      Imaging Studies   US RENAL  Result Date: 04-06-2020 CLINICAL DATA:  Kidney  injury. EXAM: RENAL / URINARY TRACT ULTRASOUND COMPLETE COMPARISON:  CT abdomen and pelvis 03/05/2020 FINDINGS: Right Kidney: Renal measurements: 10.3 x 5.4 x 4.5 cm = volume: 131 mL. Cortical thinning. Mildly increased parenchymal echogenicity. No hydronephrosis. 5.1 cm interpolar cyst and 1.1 cm lower pole cyst. Left Kidney: Renal measurements: 10.6 x 5.3 x 3.8 cm = volume: 112 mL. Cortical thinning. Mildly increased parenchymal echogenicity. No mass or hydronephrosis visualized. Bladder: Appears normal for degree of bladder distention. Other: None. IMPRESSION: 1. Bilateral renal cortical thinning and increased parenchymal echogenicity compatible with medical renal disease. No hydronephrosis. 2. Right renal cysts. Electronically Signed   By: Logan Bores M.D.   On: 03/08/2020 10:18   DG Chest Port 1 View  Result Date: 03/08/2020 CLINICAL DATA:  Hypoxia EXAM: PORTABLE CHEST 1 VIEW COMPARISON:   03/05/2020, 04/29/2013 FINDINGS: There is marked elevation of the right hemidiaphragm, slightly progressive since prior examination which may relate to the patient's position (sitting) on the current examination. No superimposed focal pulmonary infiltrate. No pneumothorax or pleural effusion. Cardiac size is at the upper limits of normal, unchanged. Pulmonary vascularity is normal. No acute bone abnormality. IMPRESSION: Stable elevation of the right hemidiaphragm. Stable borderline cardiomegaly. Electronically Signed   By: Fidela Salisbury MD   On: 03/08/2020 10:41   ECHOCARDIOGRAM COMPLETE  Result Date: 03/08/2020    ECHOCARDIOGRAM REPORT   Patient Name:   LABELLA GOLOB [facility] Date of Exam: 03/07/2020 Medical Rec #:  [id number]     Height:       64.0 in Accession #:    [id number]    Weight:       210.0 lb Date of Birth:  02-Oct-1928     BSA:          1.997 m Patient Age:    4 years      BP:           104/56 mmHg Patient Gender: F             HR:           102 bpm. Exam Location:  [facility] Procedure: 2D Echo, Cardiac Doppler and Color Doppler Indications:     CHF- acute diastolic [id number]  History:         Patient has no prior history of Echocardiogram examinations.                  CHF, COPD; Risk Factors:Hypertension.  Sonographer:     Sherrie Sport RDCS (AE) Referring Phys:  [id number] Loletha Grayer Diagnosing Phys: Yolonda Kida MD IMPRESSIONS  1. Hyperdynamic LVF EF=70-75%.  2. Left ventricular ejection fraction, by estimation, is 70 to 75%. The left ventricle has hyperdynamic function. The left ventricle has no regional wall motion abnormalities. Left ventricular diastolic parameters are consistent with Grade I diastolic dysfunction (impaired relaxation).  3. Right ventricular systolic function is normal. The right ventricular size is normal.  4. The mitral valve is normal in structure. No evidence of mitral valve regurgitation.  5. The aortic valve is grossly normal. Aortic valve regurgitation is not visualized. FINDINGS   Left Ventricle: Left ventricular ejection fraction, by estimation, is 70 to 75%. The left ventricle has hyperdynamic function. The left ventricle has no regional wall motion abnormalities. The left ventricular internal cavity size was normal in size. There is no left ventricular hypertrophy. Left ventricular diastolic parameters are consistent with Grade I diastolic dysfunction (impaired relaxation). Right Ventricle: The right ventricular size is normal. No increase in right ventricular  wall thickness. Right ventricular systolic function is normal. Left Atrium: Left atrial size was normal in size. Right Atrium: Right atrial size was normal in size. Pericardium: There is no evidence of pericardial effusion. Mitral Valve: The mitral valve is normal in structure. No evidence of mitral valve regurgitation. Tricuspid Valve: The tricuspid valve is normal in structure. Tricuspid valve regurgitation is trivial. Aortic Valve: The aortic valve is grossly normal. Aortic valve regurgitation is not visualized. Aortic valve mean gradient measures 5.0 mmHg. Aortic valve peak gradient measures 8.4 mmHg. Aortic valve area, by VTI measures 2.90 cm. Pulmonic Valve: The pulmonic valve was normal in structure. Pulmonic valve regurgitation is not visualized. Aorta: The ascending aorta was not well visualized. IAS/Shunts: No atrial level shunt detected by color flow Doppler. Additional Comments: Hyperdynamic LVF EF=70-75%.  LEFT VENTRICLE PLAX 2D LVIDd:         4.67 cm  Diastology LVIDs:         2.37 cm  LV e' medial:    4.79 cm/s LV PW:         1.35 cm  LV E/e' medial:  18.6 LV IVS:        0.97 cm  LV e' lateral:   3.92 cm/s LVOT diam:     2.00 cm  LV E/e' lateral: 22.7 LV SV:         74 LV SV Index:   37 LVOT Area:     3.14 cm  RIGHT VENTRICLE RV Basal diam:  3.43 cm RV S prime:     10.20 cm/s TAPSE (M-mode): 3.0 cm LEFT ATRIUM           Index       RIGHT ATRIUM           Index LA diam:      4.50 cm 2.25 cm/m  RA Area:     10.50 cm  LA Vol (A2C): 42.4 ml 21.23 ml/m RA Volume:   20.40 ml  10.21 ml/m LA Vol (A4C): 55.7 ml 27.89 ml/m  AORTIC VALVE                   PULMONIC VALVE AV Area (Vmax):    2.25 cm    PV Vmax:        0.90 m/s AV Area (Vmean):   2.33 cm    PV Peak grad:   3.2 mmHg AV Area (VTI):     2.90 cm    RVOT Peak grad: 5 mmHg AV Vmax:           145.00 cm/s AV Vmean:          99.000 cm/s AV VTI:            0.255 m AV Peak Grad:      8.4 mmHg AV Mean Grad:      5.0 mmHg LVOT Vmax:         104.00 cm/s LVOT Vmean:        73.500 cm/s LVOT VTI:          0.235 m LVOT/AV VTI ratio: 0.92  AORTA Ao Root diam: 2.80 cm MITRAL VALVE                TRICUSPID VALVE MV Area (PHT): 4.49 cm     TR Peak grad:   40.7 mmHg MV Decel Time: 169 msec     TR Vmax:        319.00 cm/s MV E velocity: 89.10 cm/s MV A velocity: 102.00  cm/s  SHUNTS MV E/A ratio:  0.87         Systemic VTI:  0.24 m                             Systemic Diam: 2.00 cm Yolonda Kida MD Electronically signed by Yolonda Kida MD Signature Date/Time: 03/08/2020/7:56:13 AM    Final      Medications   Scheduled Meds: . allopurinol  100 mg Oral Daily  . atorvastatin  20 mg Oral QPM  . budesonide (PULMICORT) nebulizer solution  0.5 mg Nebulization BID  . calcitRIOL  0.25 mcg Oral Daily  . diltiazem  180 mg Oral Daily  . heparin  5,000 Units Subcutaneous Q8H  . ipratropium-albuterol  3 mL Nebulization Q6H  . methylPREDNISolone (SOLU-MEDROL) injection  40 mg Intravenous Daily  . multivitamin-lutein  1 capsule Oral Daily  . pantoprazole  40 mg Oral Daily   Continuous Infusions: . sodium chloride 40 mL/hr at 03/08/20 1234  . [START ON 03/09/2020]  ceFAZolin (ANCEF) IV         LOS: 3 days    Time spent: 30 minutes with greater than 50% spent at bedside and in coordination of care.    Ezekiel Slocumb, DO [facility]  03/08/2020, 7:41 PM      If 7PM-7AM, please contact night-coverage. How to contact the [facility] Attending or Consulting provider  [place] or covering provider during after hours [place], for this patient?    1. Check the care team in [facility] and look for a) attending/consulting [facility] provider listed and b) the [facility] team listed 2. Log into [email or web address] and use 's universal password to access. If you do not have the password, please contact the hospital operator. 3. Locate the [facility] provider you are looking for under [facility] and page to a number that you can be directly reached. 4. If you still have difficulty reaching the provider, please page the [facility] (Director on Call) for the Hospitalists listed on amion for assistance.

## 2020-03-08 NOTE — Progress Notes (Signed)
[place], [place] 03/08/20  Subjective:   Gloria Day is a 87 y.o female with PMHX significant of COPD, CHF per, HTN, CKD stage 4, gout, and anemia of chronic disease. She presents to the ED today with c/o nausea, vomiting, diarrhea, and dysuria for 2-3 days.  Dx was determined UTI with sepsis, gastroenteritis and AKI on CKD stage 4. She currently sees dr Juleen China for CKD management.   Patient seen while resting comfortable in bed. Alert and able to answer questions Currently on 2L [place], denies shortness of breath Denies chest pain or discomfort Able to eat meals with no nausea  Objective:  Vital signs in last 24 hours:  Temp:  [97.5 F (36.4 C)-97.9 F (36.6 C)] 97.5 F (36.4 C) (02/10 0801) Pulse Rate:  [90-96] 95 (02/10 0801) Resp:  [16-18] 18 (02/10 0801) BP: (122-153)/(59-83) 144/70 (02/10 0801) SpO2:  [83 %-97 %] 96 % (02/10 0801)  Weight change:  Filed Weights   03/05/20 1649  Weight: 95.3 kg    Intake/Output:    Intake/Output Summary (Last 24 hours) at 03/08/2020 0953 Last data filed at 03/07/2020 1800 Gross per 24 hour  Intake -  Output 350 ml  Net -350 ml     Physical Exam: General: Resting in bed, NAD  HEENT Anicteric, [place]  Pulm/lungs Dyspnea at rest, 2L O2, Rt upper expiratory wheeze  CVS/Heart No chest pain, S1-S2 present  Abdomen:  Soft, non-tender  Extremities: Trace peripheral edema  Neurologic: Alert, oriented, poor historian  Skin: No masses or rashes  Access: No access       Basic Metabolic Panel:  Recent Labs  Lab 03/05/20 1447 03/06/20 0628 03/08/20 0705  NA 134* 136 139  K 3.6 3.1* 4.3  CL 93* 97* 101  CO2 '22 25 26  '$ GLUCOSE 147* 120* 152*  BUN 47* 53* 70*  CREATININE 3.21* 3.37* 3.11*  CALCIUM 8.5* 7.9* 8.3*  MG  --   --  1.7     CBC: Recent Labs  Lab 03/05/20 1447 03/05/20 2221 03/06/20 0628 03/08/20 0705  WBC 24.3* 14.8* 17.1* 16.5*  NEUTROABS 20.7*  --   --  14.8*  HGB 12.2 11.4*  10.8* 9.8*  HCT 37.3 35.1* 32.5* 30.4*  MCV 88.8 89.5 88.8 89.7  PLT 148* 113* 124* 139*     No results found for: HEPBSAG, HEPBSAB, HEPBIGM    Microbiology:  Recent Results (from the past 240 hour(s))  Urine culture     Status: Abnormal   Collection Time: 03/05/20  2:09 PM   Specimen: Urine, Clean Catch  Result Value Ref Range Status   Specimen Description   Final    URINE, CLEAN CATCH Performed at [facility], [address], [place], [place] [address]    Special Requests   Final    NONE Performed at [facility], [place] [address], [place],  [address]    Culture >=100,000 COLONIES/mL ESCHERICHIA COLI (A)  Final   Report Status 03/08/2020 FINAL  Final   Organism ID, Bacteria ESCHERICHIA COLI (A)  Final      Susceptibility   Escherichia coli - MIC*    AMPICILLIN >=32 RESISTANT Resistant     CEFAZOLIN <=4 SENSITIVE Sensitive     CEFEPIME <=0.12 SENSITIVE Sensitive     CEFTRIAXONE <=0.25 SENSITIVE Sensitive     CIPROFLOXACIN <=0.25 SENSITIVE Sensitive     GENTAMICIN <=1 SENSITIVE Sensitive     IMIPENEM <=0.25 SENSITIVE Sensitive  NITROFURANTOIN <=16 SENSITIVE Sensitive     TRIMETH/SULFA <=20 SENSITIVE Sensitive     AMPICILLIN/SULBACTAM >=32 RESISTANT Resistant     PIP/TAZO <=4 SENSITIVE Sensitive     * >=100,000 COLONIES/mL ESCHERICHIA COLI  SARS Coronavirus 2 by RT PCR (hospital order, performed in [facility] hospital lab) Nasopharyngeal Nasopharyngeal Swab     Status: None   Collection Time: 03/05/20  6:15 PM   Specimen: Nasopharyngeal Swab  Result Value Ref Range Status   SARS Coronavirus 2 NEGATIVE NEGATIVE Final    Comment: (NOTE) SARS-CoV-2 target nucleic acids are NOT DETECTED.  The SARS-CoV-2 RNA is generally detectable in upper and lower respiratory specimens during the acute phase of infection. The lowest concentration of SARS-CoV-2 viral copies this assay can detect is 250 copies / mL. A negative result does not preclude  SARS-CoV-2 infection and should not be used as the sole basis for treatment or other patient management decisions.  A negative result may occur with improper specimen collection / handling, submission of specimen other than nasopharyngeal swab, presence of viral mutation(s) within the areas targeted by this assay, and inadequate number of viral copies (<250 copies / mL). A negative result must be combined with clinical observations, patient history, and epidemiological information.  Fact Sheet for Patients:   StrictlyIdeas.no  Fact Sheet for Healthcare Providers: BankingDealers.co.za  This test is not yet approved or  cleared by the [place] FDA and has been authorized for detection and/or diagnosis of SARS-CoV-2 by FDA under an Emergency Use Authorization (EUA).  This EUA will remain in effect (meaning this test can be used) for the duration of the COVID-19 declaration under Section 564(b)(1) of the Act, 21 U.S.C. section 360bbb-3(b)(1), unless the authorization is terminated or revoked sooner.  Performed at [facility], [address], [place], [place] [address]   Blood Culture (routine x 2)     Status: Abnormal   Collection Time: 03/05/20  6:15 PM   Specimen: BLOOD  Result Value Ref Range Status   Specimen Description   Final    BLOOD LEFT ASSIST CONTROL Performed at [facility], [address] [place], [place] [address]    Special Requests   Final    BOTTLES DRAWN AEROBIC AND ANAEROBIC Blood Culture results may not be optimal due to an inadequate volume of blood received in culture bottles Performed at [facility], [address] [place], [place] [address]    Culture  Setup Time   Final    GRAM NEGATIVE RODS IN BOTH AEROBIC AND ANAEROBIC BOTTLES CRITICAL RESULT CALLED TO, READ BACK BY AND VERIFIED WITHLloyd Huger AT [id number] 03/06/20 SDR Performed at [facility], [place] [address] [place],  [address]    Culture ESCHERICHIA COLI (A)  Final   Report Status 03/08/2020 FINAL  Final   Organism ID, Bacteria ESCHERICHIA COLI  Final      Susceptibility   Escherichia coli - MIC*    AMPICILLIN >=32 RESISTANT Resistant     CEFAZOLIN <=4 SENSITIVE Sensitive     CEFEPIME <=0.12 SENSITIVE Sensitive     CEFTAZIDIME <=1 SENSITIVE Sensitive     CEFTRIAXONE <=0.25 SENSITIVE Sensitive     CIPROFLOXACIN <=0.25 SENSITIVE Sensitive     GENTAMICIN <=1 SENSITIVE Sensitive     IMIPENEM <=0.25 SENSITIVE Sensitive     TRIMETH/SULFA <=20 SENSITIVE Sensitive     AMPICILLIN/SULBACTAM >=32 RESISTANT Resistant     PIP/TAZO <=4 SENSITIVE Sensitive     * ESCHERICHIA COLI  Blood Culture (routine x 2)     Status: Abnormal   Collection Time: 03/05/20  6:15 PM   Specimen: BLOOD  Result Value Ref Range Status   Specimen Description   Final    BLOOD RIGHT ASSIST CONTROL Performed at [facility], [address] [place], [place] [address]    Special Requests   Final    BOTTLES DRAWN AEROBIC AND ANAEROBIC Blood Culture results may not be optimal due to an inadequate volume of blood received in culture bottles Performed at [facility], [address] [place], [place] [address]    Culture  Setup Time   Final    GRAM NEGATIVE RODS IN BOTH AEROBIC AND ANAEROBIC BOTTLES CRITICAL VALUE NOTED.  VALUE IS CONSISTENT WITH PREVIOUSLY REPORTED AND CALLED VALUE. Performed at [facility], [address] [place], [place] [address]    Culture (A)  Final    ESCHERICHIA COLI SUSCEPTIBILITIES PERFORMED ON PREVIOUS CULTURE WITHIN THE LAST 5 DAYS. Performed at [facility], [place] [address] [place],  [address]    Report Status 03/08/2020 FINAL  Final  Blood Culture ID Panel (Reflexed)     Status: Abnormal   Collection Time: 03/05/20  6:15 PM  Result Value Ref Range Status   Enterococcus faecalis NOT DETECTED NOT DETECTED Final   Enterococcus Faecium NOT  DETECTED NOT DETECTED Final   Listeria monocytogenes NOT DETECTED NOT DETECTED Final   Staphylococcus species NOT DETECTED NOT DETECTED Final   Staphylococcus aureus (BCID) NOT DETECTED NOT DETECTED Final   Staphylococcus epidermidis NOT DETECTED NOT DETECTED Final   Staphylococcus lugdunensis NOT DETECTED NOT DETECTED Final   Streptococcus species NOT DETECTED NOT DETECTED Final   Streptococcus agalactiae NOT DETECTED NOT DETECTED Final   Streptococcus pneumoniae NOT DETECTED NOT DETECTED Final   Streptococcus pyogenes NOT DETECTED NOT DETECTED Final   A.calcoaceticus-baumannii NOT DETECTED NOT DETECTED Final   Bacteroides fragilis NOT DETECTED NOT DETECTED Final   Enterobacterales DETECTED (A) NOT DETECTED Final    Comment: Enterobacterales represent a large order of gram negative bacteria, not a single organism. CRITICAL RESULT CALLED TO, READ BACK BY AND VERIFIED WITH:  NATHAN BELUE AT [id number] 03/06/20 SDR    Enterobacter cloacae complex NOT DETECTED NOT DETECTED Final   Escherichia coli DETECTED (A) NOT DETECTED Final    Comment: CRITICAL RESULT CALLED TO, READ BACK BY AND VERIFIED WITH:  NATHAN BELUE AT [id number] 03/06/20 SDR    Klebsiella aerogenes NOT DETECTED NOT DETECTED Final   Klebsiella oxytoca NOT DETECTED NOT DETECTED Final   Klebsiella pneumoniae NOT DETECTED NOT DETECTED Final   Proteus species NOT DETECTED NOT DETECTED Final   Salmonella species NOT DETECTED NOT DETECTED Final   Serratia marcescens NOT DETECTED NOT DETECTED Final   Haemophilus influenzae NOT DETECTED NOT DETECTED Final   Neisseria meningitidis NOT DETECTED NOT DETECTED Final   Pseudomonas aeruginosa NOT DETECTED NOT DETECTED Final   Stenotrophomonas maltophilia NOT DETECTED NOT DETECTED Final   Candida albicans NOT DETECTED NOT DETECTED Final   Candida auris NOT DETECTED NOT DETECTED Final   Candida glabrata NOT DETECTED NOT DETECTED Final   Candida krusei NOT DETECTED NOT DETECTED Final   Candida  parapsilosis NOT DETECTED NOT DETECTED Final   Candida tropicalis NOT DETECTED NOT DETECTED Final   Cryptococcus neoformans/gattii NOT DETECTED NOT DETECTED Final   CTX-M ESBL NOT DETECTED NOT DETECTED Final   Carbapenem resistance IMP NOT DETECTED NOT DETECTED Final   Carbapenem resistance KPC NOT DETECTED  NOT DETECTED Final   Carbapenem resistance NDM NOT DETECTED NOT DETECTED Final   Carbapenem resist OXA 48 LIKE NOT DETECTED NOT DETECTED Final   Carbapenem resistance VIM NOT DETECTED NOT DETECTED Final    Comment: Performed at [facility], [address] [place], [place] [address]    Coagulation Studies: No results for input(s): LABPROT, INR in the last 72 hours.  Urinalysis: Recent Labs    03/05/20 1409  COLORURINE ORANGE*  LABSPEC 1.020  PHURINE 6.0  GLUCOSEU 100*  HGBUR LARGE*  BILIRUBINUR MODERATE*  KETONESUR 15*  PROTEINUR >300*  NITRITE POSITIVE*  LEUKOCYTESUR LARGE*      Imaging: ECHOCARDIOGRAM COMPLETE  Result Date: 03/08/2020    ECHOCARDIOGRAM REPORT   Patient Name:   JOZI KREKE [facility] Date of Exam: 03/07/2020 Medical Rec #:  [id number]     Height:       64.0 in Accession #:    [id number]    Weight:       210.0 lb Date of Birth:  [birth year]     BSA:          1.997 m Patient Age:    71 years      BP:           104/56 mmHg Patient Gender: F             HR:           102 bpm. Exam Location:  [facility] Procedure: 2D Echo, Cardiac Doppler and Color Doppler Indications:     CHF- acute diastolic [id number]  History:         Patient has no prior history of Echocardiogram examinations.                  CHF, COPD; Risk Factors:Hypertension.  Sonographer:     Sherrie Sport RDCS (AE) Referring Phys:  [id number] Loletha Grayer Diagnosing Phys: Yolonda Kida MD IMPRESSIONS  1. Hyperdynamic LVF EF=70-75%.  2. Left ventricular ejection fraction, by estimation, is 70 to 75%. The left ventricle has hyperdynamic function. The left ventricle has no regional wall motion abnormalities. Left  ventricular diastolic parameters are consistent with Grade I diastolic dysfunction (impaired relaxation).  3. Right ventricular systolic function is normal. The right ventricular size is normal.  4. The mitral valve is normal in structure. No evidence of mitral valve regurgitation.  5. The aortic valve is grossly normal. Aortic valve regurgitation is not visualized. FINDINGS  Left Ventricle: Left ventricular ejection fraction, by estimation, is 70 to 75%. The left ventricle has hyperdynamic function. The left ventricle has no regional wall motion abnormalities. The left ventricular internal cavity size was normal in size. There is no left ventricular hypertrophy. Left ventricular diastolic parameters are consistent with Grade I diastolic dysfunction (impaired relaxation). Right Ventricle: The right ventricular size is normal. No increase in right ventricular wall thickness. Right ventricular systolic function is normal. Left Atrium: Left atrial size was normal in size. Right Atrium: Right atrial size was normal in size. Pericardium: There is no evidence of pericardial effusion. Mitral Valve: The mitral valve is normal in structure. No evidence of mitral valve regurgitation. Tricuspid Valve: The tricuspid valve is normal in structure. Tricuspid valve regurgitation is trivial. Aortic Valve: The aortic valve is grossly normal. Aortic valve regurgitation is not visualized. Aortic valve mean gradient measures 5.0 mmHg. Aortic valve peak gradient measures 8.4 mmHg. Aortic valve area, by VTI measures 2.90 cm. Pulmonic Valve: The pulmonic valve was normal in structure. Pulmonic valve regurgitation  is not visualized. Aorta: The ascending aorta was not well visualized. IAS/Shunts: No atrial level shunt detected by color flow Doppler. Additional Comments: Hyperdynamic LVF EF=70-75%.  LEFT VENTRICLE PLAX 2D LVIDd:         4.67 cm  Diastology LVIDs:         2.37 cm  LV e' medial:    4.79 cm/s LV PW:         1.35 cm  LV E/e'  medial:  18.6 LV IVS:        0.97 cm  LV e' lateral:   3.92 cm/s LVOT diam:     2.00 cm  LV E/e' lateral: 22.7 LV SV:         74 LV SV Index:   37 LVOT Area:     3.14 cm  RIGHT VENTRICLE RV Basal diam:  3.43 cm RV S prime:     10.20 cm/s TAPSE (M-mode): 3.0 cm LEFT ATRIUM           Index       RIGHT ATRIUM           Index LA diam:      4.50 cm 2.25 cm/m  RA Area:     10.50 cm LA Vol (A2C): 42.4 ml 21.23 ml/m RA Volume:   20.40 ml  10.21 ml/m LA Vol (A4C): 55.7 ml 27.89 ml/m  AORTIC VALVE                   PULMONIC VALVE AV Area (Vmax):    2.25 cm    PV Vmax:        0.90 m/s AV Area (Vmean):   2.33 cm    PV Peak grad:   3.2 mmHg AV Area (VTI):     2.90 cm    RVOT Peak grad: 5 mmHg AV Vmax:           145.00 cm/s AV Vmean:          99.000 cm/s AV VTI:            0.255 m AV Peak Grad:      8.4 mmHg AV Mean Grad:      5.0 mmHg LVOT Vmax:         104.00 cm/s LVOT Vmean:        73.500 cm/s LVOT VTI:          0.235 m LVOT/AV VTI ratio: 0.92  AORTA Ao Root diam: 2.80 cm MITRAL VALVE                TRICUSPID VALVE MV Area (PHT): 4.49 cm     TR Peak grad:   40.7 mmHg MV Decel Time: 169 msec     TR Vmax:        319.00 cm/s MV E velocity: 89.10 cm/s MV A velocity: 102.00 cm/s  SHUNTS MV E/A ratio:  0.87         Systemic VTI:  0.24 m                             Systemic Diam: 2.00 cm Dwayne Prince Rome MD Electronically signed by Yolonda Kida MD Signature Date/Time: 03/08/2020/7:56:13 AM    Final      Medications:   . [START ON 03/09/2020]  ceFAZolin (ANCEF) IV     . allopurinol  100 mg Oral Daily  . budesonide (PULMICORT) nebulizer solution  0.5 mg Nebulization BID  .  calcitRIOL  0.25 mcg Oral Daily  . heparin  5,000 Units Subcutaneous Q8H  . ipratropium-albuterol  3 mL Nebulization Q6H  . methylPREDNISolone (SOLU-MEDROL) injection  40 mg Intravenous Daily  . multivitamin-lutein  1 capsule Oral Daily  . pantoprazole  40 mg Oral Daily  . simvastatin  40 mg Oral q1800   acetaminophen **OR**  acetaminophen, albuterol, ondansetron **OR** ondansetron (ZOFRAN) IV  Assessment/ Plan:  87 y.o. female with  was admitted on 03/05/2020 for  Active Problems:   UTI (urinary tract infection)   Sepsis due to Escherichia coli with acute renal failure without septic shock (HCC)   Acute diastolic CHF (congestive heart failure) (HCC)   Acute kidney injury superimposed on CKD ([place])   Hypokalemia   Essential hypertension   Hyperlipidemia  Dehydration [E86.0] UTI (urinary tract infection) [N39.0] Nausea vomiting and diarrhea [R11.2, R19.7] Urinary tract infection without hematuria, site unspecified [N39.0]  #. Acute on chronic kidney disease stage 4 Likely due to fluid loss and dehydration related to diarrhea and dyspnea ECHO-EF 70-75%, Left Grade 1 Diastolic Dysfunction Renal US-mildly increased parenchymal echogenicity bilaterally and presence of renal cyst on right. Slight improvement in Creatinine Monitoring BUN, Low rate IVF ordered to aid kidney function  #. UTI with sepsis Prescribed antibiotics to aid recovery Primary care team will mange this condition  #. Gastroenteritis Denies diarrhea since admission Maintains good appetite No complains of nausea or vomiting   Colon Flattery, NP 2/10/20229:53 AM  [place], [place]

## 2020-03-09 DIAGNOSIS — N3001 Acute cystitis with hematuria: Secondary | ICD-10-CM | POA: Diagnosis not present

## 2020-03-09 DIAGNOSIS — A4151 Sepsis due to Escherichia coli [E. coli]: Secondary | ICD-10-CM | POA: Diagnosis not present

## 2020-03-09 DIAGNOSIS — I10 Essential (primary) hypertension: Secondary | ICD-10-CM | POA: Diagnosis not present

## 2020-03-09 DIAGNOSIS — N179 Acute kidney failure, unspecified: Secondary | ICD-10-CM | POA: Diagnosis not present

## 2020-03-09 LAB — CBC
HCT: 31.2 % — ABNORMAL LOW (ref 36.0–46.0)
Hemoglobin: 10.2 g/dL — ABNORMAL LOW (ref 12.0–15.0)
MCH: 29.1 pg (ref 26.0–34.0)
MCHC: 32.7 g/dL (ref 30.0–36.0)
MCV: 88.9 fL (ref 80.0–100.0)
Platelets: 156 10*3/uL (ref 150–400)
RBC: 3.51 MIL/uL — ABNORMAL LOW (ref 3.87–5.11)
RDW: 13.7 % (ref 11.5–15.5)
WBC: 12.9 10*3/uL — ABNORMAL HIGH (ref 4.0–10.5)
nRBC: 0 % (ref 0.0–0.2)

## 2020-03-09 LAB — GLUCOSE, CAPILLARY: Glucose-Capillary: 115 mg/dL — ABNORMAL HIGH (ref 70–99)

## 2020-03-09 LAB — BASIC METABOLIC PANEL
Anion gap: 12 (ref 5–15)
BUN: 72 mg/dL — ABNORMAL HIGH (ref 8–23)
CO2: 25 mmol/L (ref 22–32)
Calcium: 8.7 mg/dL — ABNORMAL LOW (ref 8.9–10.3)
Chloride: 104 mmol/L (ref 98–111)
Creatinine, Ser: 2.9 mg/dL — ABNORMAL HIGH (ref 0.44–1.00)
GFR, Estimated: 15 mL/min — ABNORMAL LOW (ref >60)
Glucose, Bld: 128 mg/dL — ABNORMAL HIGH (ref 70–99)
Potassium: 4 mmol/L (ref 3.5–5.1)
Sodium: 141 mmol/L (ref 135–145)

## 2020-03-09 LAB — MAGNESIUM: Magnesium: 1.8 mg/dL (ref 1.7–2.4)

## 2020-03-09 MED ORDER — IPRATROPIUM-ALBUTEROL 0.5-2.5 (3) MG/3ML IN SOLN
3.0000 mL | Freq: Three times a day (TID) | RESPIRATORY_TRACT | Status: DC
  Administered 2020-03-09 – 2020-03-14 (×14): 3 mL via RESPIRATORY_TRACT
  Filled 2020-03-09 (×14): qty 3

## 2020-03-09 MED ORDER — CEPHALEXIN 500 MG PO CAPS
500.0000 mg | ORAL_CAPSULE | Freq: Two times a day (BID) | ORAL | Status: DC
  Administered 2020-03-09 – 2020-03-12 (×6): 500 mg via ORAL
  Filled 2020-03-09 (×6): qty 1

## 2020-03-09 NOTE — Plan of Care (Signed)

## 2020-03-09 NOTE — Progress Notes (Signed)
PROGRESS NOTE    Gloria Day   [id number]  DOB: 11-Jun-1928  PCP: Sofie Hartigan, MD    DOA: 03/05/2020 LOS: 4   Brief Narrative   87 y.o. female with medical history of diastolic CHF, COPD not on home O2 , HTN, CKD stage IV, gout, anemia of chronic disease, who presented to the ED from urgent care with complaints of worsening nausea, vomiting, diarrhea and dysuria x 2-3 days. Urgent care referred patient to the ED due to low BP on 90/60 and UA showing infection and labs showing worsened renal function and leukocytosis.  Patient also reported dyspnea worse than baseline, but no hypoxia was noted.      Significant Events: - Admitted 03/05/2020  Assessment & Plan   Active Problems:   UTI (urinary tract infection)   Sepsis due to Escherichia coli with acute renal failure without septic shock (HCC)   Acute diastolic CHF (congestive heart failure) (HCC)   Acute kidney injury superimposed on CKD ([place])   Hypokalemia   Essential hypertension   Hyperlipidemia   Severe sepsis due to E. coli bacteremia and UTI -presented with severe sepsis as evidenced by leukocytosis, tachycardia, fever in the setting of UTI.  AKI superimposed on CKD stage IV reflects organ dysfunction consistent with severe sepsis.  Sepsis physiology improved. Abx: Rocephin >> Ancef --Change antibiotic to Ancef based on culture, Keflex at d/c  --Complete 7 day course antibiotics given uncomplicated GNR bacteremia --Follow repeat cultures - neg to date  Nausea vomiting -reported on admission, improved.  Likely secondary to above.  Antiemetics as needed.  Acute on chronic diastolic CHF -IV fluids previously held.  Patient appears euvolemic on exam.   Echo - EF 70-75% hyperdynamic LVEF systolic function, grade 1 diastolic dysfunction. --Monitor volume status closely, on fluids for AKI as below  Acute respiratory failure with hypoxia - pt not on supplemental oxygen at baseline, requiring 2 L/min currently,  along with  subjective shortness of breath.   Pt has elevated R hemidiaphragm, and not mobilizing therefore atelectasis contributes to hypoxia.  No sign of PNA on CXR.  Doubt PE without tachycardia or hemodynamic changes; dyspnea also is stable. 2/10 repeat Chest xray improved vascular congestion --wean oxygen as tolerated, supplement O2 to keep sats >=88%  COPD with mild acute exacerbation - had mild wheezing 2/8, treated with IV steroid, exacerbation resolved.  Per chart review, appears pt used to be prescribed inhalers but stopped them on her own as she did not feel they were helpful. --d/w pt and daughter re initiating maintenance inhalers at d/c --PRN albuterol for now --d/c IV steroid for now and monitor --Duonebs q6h, Pulmicort nebs BID  AKI superimposed on CKD stage IV -patient received some IV fluids on arrival.  Was subsequently placed on oxygen so fluids were stopped.  Nephrology is consulted.  Monitor BMP.  2/10 - resumed on some IV fluids since EF normal.  Hold Lasix.  Hypokalemia -potassium was replaced.  Monitor and replace as needed.  Diarrhea -present on admission, unclear etiology.  Stool studies were ordered and pending. Patient denies further episodes of diarrhea since admission.  Monitor.  Generalized weakness-evaluated by physical therapy and home health PT with 24-hour supervision was recommended.  TOC following for disposition planning.  Hyperlipidemia -continue Zocor  Essential hypertension -antihypertensive medications initially held due to soft BP.  BP's elevated today.  Resume Cardizem.  Hold Lasix.  Monitor BP and resume when indicated.   Obesity: Body mass index is 36.05 kg/m.  Complicates overall care and prognosis.  Recommend physical activity and diet efforts towards weight loss.  Primary care follow-up.  DVT prophylaxis: heparin injection 5,000 Units Start: 03/05/20 2215   Diet:  Diet Orders (From admission, onward)    Start     Ordered   03/06/20  1149  Diet Heart Room service appropriate? Yes; Fluid consistency: Thin  Diet effective now       Question Answer Comment  Room service appropriate? Yes   Fluid consistency: Thin      03/06/20 1149            Code Status: DNR    Subjective 03/09/20    Patient was napping but woke easily.  Reports she feels well.  Has her baseline SOB which is no worse.  Says having vivid dreams that wake her up at night, questions if related to any medication.   Disposition Plan & Communication   Status is: Inpatient  Remains inpatient appropriate because:IV treatments appropriate due to intensity of illness or inability to take PO.   Persistent acute on chronic renal failure, on IV fluids & requires close monitoring.   Dispo: The patient is from: Home              Anticipated d/c is to: Home              Anticipated d/c date is: ~2 days              Patient currently is not medically stable to d/c.   Difficult to place patient No  Family communication - daughter at bedside on rounds today   Consults, Procedures, Significant Events   Consultants:   Nephrology  Procedures:   none  Antimicrobials:  Anti-infectives (From admission, onward)   Start     Dose/Rate Route Frequency Ordered Stop   03/09/20 2200  cephALEXin (KEFLEX) capsule 500 mg        500 mg Oral Every 12 hours 03/09/20 1253 03/16/20 0959   03/09/20 0800  ceFAZolin (ANCEF) IVPB 1 g/50 mL premix  Status:  Discontinued        1 g 100 mL/hr over 30 Minutes Intravenous Every 12 hours 03/08/20 0932 03/09/20 1253   03/06/20 1000  cefTRIAXone (ROCEPHIN) 2 g in sodium chloride 0.9 % 100 mL IVPB        2 g 200 mL/hr over 30 Minutes Intravenous Every 24 hours 03/06/20 0745 03/08/20 0936   03/05/20 1730  cefTRIAXone (ROCEPHIN) 1 g in sodium chloride 0.9 % 100 mL IVPB  Status:  Discontinued        1 g 200 mL/hr over 30 Minutes Intravenous Every 24 hours 03/05/20 1716 03/06/20 0745        Objective   Vitals:   03/09/20  1213 03/09/20 1623 03/09/20 2015 03/09/20 2029  BP: 138/67 (!) 155/69  (!) 150/70  Pulse: 83 86  93  Resp: '15 17  18  '$ Temp: 97.6 F (36.4 C) 98 F (36.7 C)  97.7 F (36.5 C)  TempSrc: Oral Oral    SpO2: 92% 92% 93% 100%  Weight:      Height:       No intake or output data in the 24 hours ending 03/09/20 2130 Filed Weights   03/05/20 1649  Weight: 95.3 kg    Physical Exam:  General exam: awake, alert, no acute distress Respiratory system: CTAB, on room air, normal respiratory effort. Cardiovascular system: normal S1/S2, RRR, no pedal edema.  Gastrointestinal system: soft, NT,  ND Central nervous system: normal speech, CN's grossly intact, grossly nonfocal exam     Labs   Data Reviewed: I have personally reviewed following labs and imaging studies  CBC: Recent Labs  Lab 03/05/20 1447 03/05/20 2221 03/06/20 0628 03/08/20 0705 03/09/20 0516  WBC 24.3* 14.8* 17.1* 16.5* 12.9*  NEUTROABS 20.7*  --   --  14.8*  --   HGB 12.2 11.4* 10.8* 9.8* 10.2*  HCT 37.3 35.1* 32.5* 30.4* 31.2*  MCV 88.8 89.5 88.8 89.7 88.9  PLT 148* 113* 124* 139* [id number]   Basic Metabolic Panel: Recent Labs  Lab 03/05/20 1447 03/06/20 0628 03/08/20 0705 03/09/20 0516  NA 134* 136 139 141  K 3.6 3.1* 4.3 4.0  CL 93* 97* 101 104  CO2 '22 25 26 25  '$ GLUCOSE 147* 120* 152* 128*  BUN 47* 53* 70* 72*  CREATININE 3.21* 3.37* 3.11* 2.90*  CALCIUM 8.5* 7.9* 8.3* 8.7*  MG  --   --  1.7 1.8   GFR: Estimated Creatinine Clearance: 14.7 mL/min (A) (by C-G formula based on SCr of 2.9 mg/dL (H)). Liver Function Tests: Recent Labs  Lab 03/05/20 1447 03/06/20 0628  AST 37 26  ALT 22 12  ALKPHOS 111 103  BILITOT 1.4* 1.2  PROT 6.3* 5.4*  ALBUMIN 3.2* 2.7*   No results for input(s): LIPASE, AMYLASE in the last 168 hours. No results for input(s): AMMONIA in the last 168 hours. Coagulation Profile: No results for input(s): INR, PROTIME in the last 168 hours. Cardiac Enzymes: No results for  input(s): CKTOTAL, CKMB, CKMBINDEX, TROPONINI in the last 168 hours. BNP (last 3 results) No results for input(s): PROBNP in the last 8760 hours. HbA1C: No results for input(s): HGBA1C in the last 72 hours. CBG: Recent Labs  Lab 03/06/20 0747 03/07/20 0758 03/08/20 0802 03/09/20 0817  GLUCAP 114* 148* 137* 115*   Lipid Profile: No results for input(s): CHOL, HDL, LDLCALC, TRIG, CHOLHDL, LDLDIRECT in the last 72 hours. Thyroid Function Tests: No results for input(s): TSH, T4TOTAL, FREET4, T3FREE, THYROIDAB in the last 72 hours. Anemia Panel: No results for input(s): VITAMINB12, FOLATE, FERRITIN, TIBC, IRON, RETICCTPCT in the last 72 hours. Sepsis Labs: Recent Labs  Lab 03/05/20 1815  LATICACIDVEN 1.6    Recent Results (from the past 240 hour(s))  Urine culture     Status: Abnormal   Collection Time: 03/05/20  2:09 PM   Specimen: Urine, Clean Catch  Result Value Ref Range Status   Specimen Description   Final    URINE, CLEAN CATCH Performed at [facility], [address] [place], [place] [address]    Special Requests   Final    NONE Performed at [facility], [place] [address], [place], [place] [address]    Culture >=100,000 COLONIES/mL ESCHERICHIA COLI (A)  Final   Report Status 03/08/2020 FINAL  Final   Organism ID, Bacteria ESCHERICHIA COLI (A)  Final      Susceptibility   Escherichia coli - MIC*    AMPICILLIN >=32 RESISTANT Resistant     CEFAZOLIN <=4 SENSITIVE Sensitive     CEFEPIME <=0.12 SENSITIVE Sensitive     CEFTRIAXONE <=0.25 SENSITIVE Sensitive     CIPROFLOXACIN <=0.25 SENSITIVE Sensitive     GENTAMICIN <=1 SENSITIVE Sensitive     IMIPENEM <=0.25 SENSITIVE Sensitive     NITROFURANTOIN <=16 SENSITIVE Sensitive     TRIMETH/SULFA <=20 SENSITIVE Sensitive     AMPICILLIN/SULBACTAM >=32 RESISTANT Resistant     PIP/TAZO <=4 SENSITIVE  Sensitive     * >=100,000 COLONIES/mL ESCHERICHIA COLI  SARS Coronavirus 2 by RT PCR (hospital order,  performed in [facility] hospital lab) Nasopharyngeal Nasopharyngeal Swab     Status: None   Collection Time: 03/05/20  6:15 PM   Specimen: Nasopharyngeal Swab  Result Value Ref Range Status   SARS Coronavirus 2 NEGATIVE NEGATIVE Final    Comment: (NOTE) SARS-CoV-2 target nucleic acids are NOT DETECTED.  The SARS-CoV-2 RNA is generally detectable in upper and lower respiratory specimens during the acute phase of infection. The lowest concentration of SARS-CoV-2 viral copies this assay can detect is 250 copies / mL. A negative result does not preclude SARS-CoV-2 infection and should not be used as the sole basis for treatment or other patient management decisions.  A negative result may occur with improper specimen collection / handling, submission of specimen other than nasopharyngeal swab, presence of viral mutation(s) within the areas targeted by this assay, and inadequate number of viral copies (<250 copies / mL). A negative result must be combined with clinical observations, patient history, and epidemiological information.  Fact Sheet for Patients:   StrictlyIdeas.no  Fact Sheet for Healthcare Providers: BankingDealers.co.za  This test is not yet approved or  cleared by the [place] FDA and has been authorized for detection and/or diagnosis of SARS-CoV-2 by FDA under an Emergency Use Authorization (EUA).  This EUA will remain in effect (meaning this test can be used) for the duration of the COVID-19 declaration under Section 564(b)(1) of the Act, 21 U.S.C. section 360bbb-3(b)(1), unless the authorization is terminated or revoked sooner.  Performed at [facility], [address] [place], [place] [address]   Blood Culture (routine x 2)     Status: Abnormal   Collection Time: 03/05/20  6:15 PM   Specimen: BLOOD  Result Value Ref Range Status   Specimen Description   Final    BLOOD LEFT ASSIST CONTROL Performed  at [facility], [address] [place], [place] [address]    Special Requests   Final    BOTTLES DRAWN AEROBIC AND ANAEROBIC Blood Culture results may not be optimal due to an inadequate volume of blood received in culture bottles Performed at [facility], [address] [place],  [address]    Culture  Setup Time   Final    GRAM NEGATIVE RODS IN BOTH AEROBIC AND ANAEROBIC BOTTLES CRITICAL RESULT CALLED TO, READ BACK BY AND VERIFIED WITHLloyd Huger AT [id number] 03/06/20 SDR Performed at [facility], [place] [address] [place], [place] [address]    Culture ESCHERICHIA COLI (A)  Final   Report Status 03/08/2020 FINAL  Final   Organism ID, Bacteria ESCHERICHIA COLI  Final      Susceptibility   Escherichia coli - MIC*    AMPICILLIN >=32 RESISTANT Resistant     CEFAZOLIN <=4 SENSITIVE Sensitive     CEFEPIME <=0.12 SENSITIVE Sensitive     CEFTAZIDIME <=1 SENSITIVE Sensitive     CEFTRIAXONE <=0.25 SENSITIVE Sensitive     CIPROFLOXACIN <=0.25 SENSITIVE Sensitive     GENTAMICIN <=1 SENSITIVE Sensitive     IMIPENEM <=0.25 SENSITIVE Sensitive     TRIMETH/SULFA <=20 SENSITIVE Sensitive     AMPICILLIN/SULBACTAM >=32 RESISTANT Resistant     PIP/TAZO <=4 SENSITIVE Sensitive     * ESCHERICHIA COLI  Blood Culture (routine x 2)     Status: Abnormal   Collection Time: 03/05/20  6:15 PM   Specimen: BLOOD  Result Value  Ref Range Status   Specimen Description   Final    BLOOD RIGHT ASSIST CONTROL Performed at [facility], [address] [place], [place] [address]    Special Requests   Final    BOTTLES DRAWN AEROBIC AND ANAEROBIC Blood Culture results may not be optimal due to an inadequate volume of blood received in culture bottles Performed at [facility], [address], [place], [place] [address]    Culture  Setup Time   Final    GRAM NEGATIVE RODS IN BOTH AEROBIC AND ANAEROBIC BOTTLES CRITICAL VALUE NOTED.  VALUE IS CONSISTENT WITH  PREVIOUSLY REPORTED AND CALLED VALUE. Performed at [facility], [address] [place], [place] [address]    Culture (A)  Final    ESCHERICHIA COLI SUSCEPTIBILITIES PERFORMED ON PREVIOUS CULTURE WITHIN THE LAST 5 DAYS. Performed at [facility], [place] [address], [place], [place] [address]    Report Status 03/08/2020 FINAL  Final  Blood Culture ID Panel (Reflexed)     Status: Abnormal   Collection Time: 03/05/20  6:15 PM  Result Value Ref Range Status   Enterococcus faecalis NOT DETECTED NOT DETECTED Final   Enterococcus Faecium NOT DETECTED NOT DETECTED Final   Listeria monocytogenes NOT DETECTED NOT DETECTED Final   Staphylococcus species NOT DETECTED NOT DETECTED Final   Staphylococcus aureus (BCID) NOT DETECTED NOT DETECTED Final   Staphylococcus epidermidis NOT DETECTED NOT DETECTED Final   Staphylococcus lugdunensis NOT DETECTED NOT DETECTED Final   Streptococcus species NOT DETECTED NOT DETECTED Final   Streptococcus agalactiae NOT DETECTED NOT DETECTED Final   Streptococcus pneumoniae NOT DETECTED NOT DETECTED Final   Streptococcus pyogenes NOT DETECTED NOT DETECTED Final   A.calcoaceticus-baumannii NOT DETECTED NOT DETECTED Final   Bacteroides fragilis NOT DETECTED NOT DETECTED Final   Enterobacterales DETECTED (A) NOT DETECTED Final    Comment: Enterobacterales represent a large order of gram negative bacteria, not a single organism. CRITICAL RESULT CALLED TO, READ BACK BY AND VERIFIED WITH:  NATHAN BELUE AT [id number] 03/06/20 SDR    Enterobacter cloacae complex NOT DETECTED NOT DETECTED Final   Escherichia coli DETECTED (A) NOT DETECTED Final    Comment: CRITICAL RESULT CALLED TO, READ BACK BY AND VERIFIED WITH:  NATHAN BELUE AT [id number] 03/06/20 SDR    Klebsiella aerogenes NOT DETECTED NOT DETECTED Final   Klebsiella oxytoca NOT DETECTED NOT DETECTED Final   Klebsiella pneumoniae NOT DETECTED NOT DETECTED Final   Proteus species NOT DETECTED NOT DETECTED Final    Salmonella species NOT DETECTED NOT DETECTED Final   Serratia marcescens NOT DETECTED NOT DETECTED Final   Haemophilus influenzae NOT DETECTED NOT DETECTED Final   Neisseria meningitidis NOT DETECTED NOT DETECTED Final   Pseudomonas aeruginosa NOT DETECTED NOT DETECTED Final   Stenotrophomonas maltophilia NOT DETECTED NOT DETECTED Final   Candida albicans NOT DETECTED NOT DETECTED Final   Candida auris NOT DETECTED NOT DETECTED Final   Candida glabrata NOT DETECTED NOT DETECTED Final   Candida krusei NOT DETECTED NOT DETECTED Final   Candida parapsilosis NOT DETECTED NOT DETECTED Final   Candida tropicalis NOT DETECTED NOT DETECTED Final   Cryptococcus neoformans/gattii NOT DETECTED NOT DETECTED Final   CTX-M ESBL NOT DETECTED NOT DETECTED Final   Carbapenem resistance IMP NOT DETECTED NOT DETECTED Final   Carbapenem resistance KPC NOT DETECTED NOT DETECTED Final   Carbapenem resistance NDM NOT DETECTED NOT DETECTED Final   Carbapenem resist OXA 48 LIKE NOT DETECTED NOT DETECTED Final  Carbapenem resistance VIM NOT DETECTED NOT DETECTED Final    Comment: Performed at [facility], [address] [place], [place] [address]      Imaging Studies   US RENAL  Result Date: Apr 04, 2020 CLINICAL DATA:  Kidney injury. EXAM: RENAL / URINARY TRACT ULTRASOUND COMPLETE COMPARISON:  CT abdomen and pelvis 03/05/2020 FINDINGS: Right Kidney: Renal measurements: 10.3 x 5.4 x 4.5 cm = volume: 131 mL. Cortical thinning. Mildly increased parenchymal echogenicity. No hydronephrosis. 5.1 cm interpolar cyst and 1.1 cm lower pole cyst. Left Kidney: Renal measurements: 10.6 x 5.3 x 3.8 cm = volume: 112 mL. Cortical thinning. Mildly increased parenchymal echogenicity. No mass or hydronephrosis visualized. Bladder: Appears normal for degree of bladder distention. Other: None. IMPRESSION: 1. Bilateral renal cortical thinning and increased parenchymal echogenicity compatible with medical renal disease. No  hydronephrosis. 2. Right renal cysts. Electronically Signed   By: Logan Bores M.D.   On: 04/04/20 10:18   DG Chest Port 1 View  Result Date: 2020-04-04 CLINICAL DATA:  Hypoxia EXAM: PORTABLE CHEST 1 VIEW COMPARISON:  03/05/2020, 04/29/2013 FINDINGS: There is marked elevation of the right hemidiaphragm, slightly progressive since prior examination which may relate to the patient's position (sitting) on the current examination. No superimposed focal pulmonary infiltrate. No pneumothorax or pleural effusion. Cardiac size is at the upper limits of normal, unchanged. Pulmonary vascularity is normal. No acute bone abnormality. IMPRESSION: Stable elevation of the right hemidiaphragm. Stable borderline cardiomegaly. Electronically Signed   By: Fidela Salisbury MD   On: 2020/04/04 10:41     Medications   Scheduled Meds: . allopurinol  100 mg Oral Daily  . atorvastatin  20 mg Oral QPM  . budesonide (PULMICORT) nebulizer solution  0.5 mg Nebulization BID  . calcitRIOL  0.25 mcg Oral Daily  . cephALEXin  500 mg Oral Q12H  . diltiazem  180 mg Oral Daily  . heparin  5,000 Units Subcutaneous Q8H  . ipratropium-albuterol  3 mL Nebulization TID  . multivitamin-lutein  1 capsule Oral Daily  . pantoprazole  40 mg Oral Daily   Continuous Infusions: . sodium chloride 40 mL/hr at 04/04/2020 1234       LOS: 4 days    Time spent: 25 minutes with greater than 50% spent at bedside and in coordination of care.    Ezekiel Slocumb, DO [facility]  03/09/2020, 9:30 PM      If 7PM-7AM, please contact night-coverage. How to contact the [facility] Attending or Consulting provider [place] or covering provider during after hours [place], for this patient?    1. Check the care team in [facility] and look for a) attending/consulting [facility] provider listed and b) the [facility] team listed 2. Log into [email or web address] and use [facility]'s universal password to access. If you do not have the password, please contact the hospital  operator. 3. Locate the [facility] provider you are looking for under [facility] and page to a number that you can be directly reached. 4. If you still have difficulty reaching the provider, please page the [facility] (Director on Call) for the Hospitalists listed on amion for assistance.

## 2020-03-09 NOTE — Progress Notes (Signed)
[place], [place] 03/09/20  Subjective:   Gloria Day is a 87 y.o female with PMHX significant of COPD, CHF per, HTN, CKD stage 4, gout, and anemia of chronic disease. She presents to the ED today with c/o nausea, vomiting, diarrhea, and dysuria for 2-3 days.  Dx was determined UTI with sepsis, gastroenteritis and AKI on CKD stage 4. She currently sees dr Juleen China for CKD management.   Patient sitting at side of bed on arrival. No concerns overnight.  Currently on room air No complaints of shortness of breath Able to eat breakfast without nausea  Objective:  Vital signs in last 24 hours:  Temp:  [97.6 F (36.4 C)-98.4 F (36.9 C)] 97.9 F (36.6 C) (02/11 0816) Pulse Rate:  [85-92] 85 (02/11 0816) Resp:  [17-18] 17 (02/11 0816) BP: (124-164)/(59-83) 124/60 (02/11 0816) SpO2:  [90 %-93 %] 90 % (02/11 0816)  Weight change:  Filed Weights   03/05/20 1649  Weight: 95.3 kg    Intake/Output:    Intake/Output Summary (Last 24 hours) at 03/09/2020 1125 Last data filed at 03/08/2020 1952 Gross per 24 hour  Intake --  Output 250 ml  Net -250 ml     Physical Exam: General: Sitting side of bed, NAD  HEENT Anicteric, moist oral mucosa  Pulm/lungs Clear bilaterally  CVS/Heart No chest pain, S1-S2 present  Abdomen:  Soft, non-tender  Extremities: Trace peripheral edema  Neurologic: Alert, oriented, poor historian  Skin: No masses or rashes  Access: No access       Basic Metabolic Panel:  Recent Labs  Lab 03/05/20 1447 03/06/20 0628 03/08/20 0705 03/09/20 0516  NA 134* 136 139 141  K 3.6 3.1* 4.3 4.0  CL 93* 97* 101 104  CO2 '22 25 26 25  '$ GLUCOSE 147* 120* 152* 128*  BUN 47* 53* 70* 72*  CREATININE 3.21* 3.37* 3.11* 2.90*  CALCIUM 8.5* 7.9* 8.3* 8.7*  MG  --   --  1.7 1.8     CBC: Recent Labs  Lab 03/05/20 1447 03/05/20 2221 03/06/20 0628 03/08/20 0705 03/09/20 0516  WBC 24.3* 14.8* 17.1* 16.5* 12.9*  NEUTROABS 20.7*   --   --  14.8*  --   HGB 12.2 11.4* 10.8* 9.8* 10.2*  HCT 37.3 35.1* 32.5* 30.4* 31.2*  MCV 88.8 89.5 88.8 89.7 88.9  PLT 148* 113* 124* 139* 156     No results found for: HEPBSAG, HEPBSAB, HEPBIGM    Microbiology:  Recent Results (from the past 240 hour(s))  Urine culture     Status: Abnormal   Collection Time: 03/05/20  2:09 PM   Specimen: Urine, Clean Catch  Result Value Ref Range Status   Specimen Description   Final    URINE, CLEAN CATCH Performed at [facility], [address] [place], [place] [address]    Special Requests   Final    NONE Performed at [facility], [place] [address] [place], [place] [address]    Culture >=100,000 COLONIES/mL ESCHERICHIA COLI (A)  Final   Report Status 03/08/2020 FINAL  Final   Organism ID, Bacteria ESCHERICHIA COLI (A)  Final      Susceptibility   Escherichia coli - MIC*    AMPICILLIN >=32 RESISTANT Resistant     CEFAZOLIN <=4 SENSITIVE Sensitive     CEFEPIME <=0.12 SENSITIVE Sensitive     CEFTRIAXONE <=0.25 SENSITIVE Sensitive     CIPROFLOXACIN <=0.25 SENSITIVE Sensitive     GENTAMICIN <=1 SENSITIVE Sensitive  IMIPENEM <=0.25 SENSITIVE Sensitive     NITROFURANTOIN <=16 SENSITIVE Sensitive     TRIMETH/SULFA <=20 SENSITIVE Sensitive     AMPICILLIN/SULBACTAM >=32 RESISTANT Resistant     PIP/TAZO <=4 SENSITIVE Sensitive     * >=100,000 COLONIES/mL ESCHERICHIA COLI  SARS Coronavirus 2 by RT PCR (hospital order, performed in [facility] hospital lab) Nasopharyngeal Nasopharyngeal Swab     Status: None   Collection Time: 03/05/20  6:15 PM   Specimen: Nasopharyngeal Swab  Result Value Ref Range Status   SARS Coronavirus 2 NEGATIVE NEGATIVE Final    Comment: (NOTE) SARS-CoV-2 target nucleic acids are NOT DETECTED.  The SARS-CoV-2 RNA is generally detectable in upper and lower respiratory specimens during the acute phase of infection. The lowest concentration of SARS-CoV-2 viral copies this assay can detect is  250 copies / mL. A negative result does not preclude SARS-CoV-2 infection and should not be used as the sole basis for treatment or other patient management decisions.  A negative result may occur with improper specimen collection / handling, submission of specimen other than nasopharyngeal swab, presence of viral mutation(s) within the areas targeted by this assay, and inadequate number of viral copies (<250 copies / mL). A negative result must be combined with clinical observations, patient history, and epidemiological information.  Fact Sheet for Patients:   StrictlyIdeas.no  Fact Sheet for Healthcare Providers: BankingDealers.co.za  This test is not yet approved or  cleared by the [place] FDA and has been authorized for detection and/or diagnosis of SARS-CoV-2 by FDA under an Emergency Use Authorization (EUA).  This EUA will remain in effect (meaning this test can be used) for the duration of the COVID-19 declaration under Section 564(b)(1) of the Act, 21 U.S.C. section 360bbb-3(b)(1), unless the authorization is terminated or revoked sooner.  Performed at [facility], [address] [place], [place] [address]   Blood Culture (routine x 2)     Status: Abnormal   Collection Time: 03/05/20  6:15 PM   Specimen: BLOOD  Result Value Ref Range Status   Specimen Description   Final    BLOOD LEFT ASSIST CONTROL Performed at [facility], [address] [place], [place] [address]    Special Requests   Final    BOTTLES DRAWN AEROBIC AND ANAEROBIC Blood Culture results may not be optimal due to an inadequate volume of blood received in culture bottles Performed at [facility], [address] [place], [place] [address]    Culture  Setup Time   Final    GRAM NEGATIVE RODS IN BOTH AEROBIC AND ANAEROBIC BOTTLES CRITICAL RESULT CALLED TO, READ BACK BY AND VERIFIED WITHLloyd Huger AT [id number] 03/06/20  SDR Performed at [facility], [place] [address] [place],  [address]    Culture ESCHERICHIA COLI (A)  Final   Report Status 03/08/2020 FINAL  Final   Organism ID, Bacteria ESCHERICHIA COLI  Final      Susceptibility   Escherichia coli - MIC*    AMPICILLIN >=32 RESISTANT Resistant     CEFAZOLIN <=4 SENSITIVE Sensitive     CEFEPIME <=0.12 SENSITIVE Sensitive     CEFTAZIDIME <=1 SENSITIVE Sensitive     CEFTRIAXONE <=0.25 SENSITIVE Sensitive     CIPROFLOXACIN <=0.25 SENSITIVE Sensitive     GENTAMICIN <=1 SENSITIVE Sensitive     IMIPENEM <=0.25 SENSITIVE Sensitive     TRIMETH/SULFA <=20 SENSITIVE Sensitive     AMPICILLIN/SULBACTAM >=32 RESISTANT Resistant     PIP/TAZO <=4 SENSITIVE  Sensitive     * ESCHERICHIA COLI  Blood Culture (routine x 2)     Status: Abnormal   Collection Time: 03/05/20  6:15 PM   Specimen: BLOOD  Result Value Ref Range Status   Specimen Description   Final    BLOOD RIGHT ASSIST CONTROL Performed at [facility], [address] [place], [place] [address]    Special Requests   Final    BOTTLES DRAWN AEROBIC AND ANAEROBIC Blood Culture results may not be optimal due to an inadequate volume of blood received in culture bottles Performed at [facility], [address], [place], [place] [address]    Culture  Setup Time   Final    GRAM NEGATIVE RODS IN BOTH AEROBIC AND ANAEROBIC BOTTLES CRITICAL VALUE NOTED.  VALUE IS CONSISTENT WITH PREVIOUSLY REPORTED AND CALLED VALUE. Performed at [facility], [address] [place], [place] [address]    Culture (A)  Final    ESCHERICHIA COLI SUSCEPTIBILITIES PERFORMED ON PREVIOUS CULTURE WITHIN THE LAST 5 DAYS. Performed at [facility], [place] [address], [place],  [address]    Report Status 03/08/2020 FINAL  Final  Blood Culture ID Panel (Reflexed)     Status: Abnormal   Collection Time: 03/05/20  6:15 PM  Result Value Ref Range Status   Enterococcus faecalis NOT  DETECTED NOT DETECTED Final   Enterococcus Faecium NOT DETECTED NOT DETECTED Final   Listeria monocytogenes NOT DETECTED NOT DETECTED Final   Staphylococcus species NOT DETECTED NOT DETECTED Final   Staphylococcus aureus (BCID) NOT DETECTED NOT DETECTED Final   Staphylococcus epidermidis NOT DETECTED NOT DETECTED Final   Staphylococcus lugdunensis NOT DETECTED NOT DETECTED Final   Streptococcus species NOT DETECTED NOT DETECTED Final   Streptococcus agalactiae NOT DETECTED NOT DETECTED Final   Streptococcus pneumoniae NOT DETECTED NOT DETECTED Final   Streptococcus pyogenes NOT DETECTED NOT DETECTED Final   A.calcoaceticus-baumannii NOT DETECTED NOT DETECTED Final   Bacteroides fragilis NOT DETECTED NOT DETECTED Final   Enterobacterales DETECTED (A) NOT DETECTED Final    Comment: Enterobacterales represent a large order of gram negative bacteria, not a single organism. CRITICAL RESULT CALLED TO, READ BACK BY AND VERIFIED WITH:  NATHAN BELUE AT [id number] 03/06/20 SDR    Enterobacter cloacae complex NOT DETECTED NOT DETECTED Final   Escherichia coli DETECTED (A) NOT DETECTED Final    Comment: CRITICAL RESULT CALLED TO, READ BACK BY AND VERIFIED WITH:  NATHAN BELUE AT [id number] 03/06/20 SDR    Klebsiella aerogenes NOT DETECTED NOT DETECTED Final   Klebsiella oxytoca NOT DETECTED NOT DETECTED Final   Klebsiella pneumoniae NOT DETECTED NOT DETECTED Final   Proteus species NOT DETECTED NOT DETECTED Final   Salmonella species NOT DETECTED NOT DETECTED Final   Serratia marcescens NOT DETECTED NOT DETECTED Final   Haemophilus influenzae NOT DETECTED NOT DETECTED Final   Neisseria meningitidis NOT DETECTED NOT DETECTED Final   Pseudomonas aeruginosa NOT DETECTED NOT DETECTED Final   Stenotrophomonas maltophilia NOT DETECTED NOT DETECTED Final   Candida albicans NOT DETECTED NOT DETECTED Final   Candida auris NOT DETECTED NOT DETECTED Final   Candida glabrata NOT DETECTED NOT DETECTED Final   Candida  krusei NOT DETECTED NOT DETECTED Final   Candida parapsilosis NOT DETECTED NOT DETECTED Final   Candida tropicalis NOT DETECTED NOT DETECTED Final   Cryptococcus neoformans/gattii NOT DETECTED NOT DETECTED Final   CTX-M ESBL NOT DETECTED NOT DETECTED Final   Carbapenem resistance IMP NOT DETECTED NOT  DETECTED Final   Carbapenem resistance KPC NOT DETECTED NOT DETECTED Final   Carbapenem resistance NDM NOT DETECTED NOT DETECTED Final   Carbapenem resist OXA 48 LIKE NOT DETECTED NOT DETECTED Final   Carbapenem resistance VIM NOT DETECTED NOT DETECTED Final    Comment: Performed at [facility], [address] [place], [place] [address]    Coagulation Studies: No results for input(s): LABPROT, INR in the last 72 hours.  Urinalysis: No results for input(s): COLORURINE, LABSPEC, PHURINE, GLUCOSEU, HGBUR, BILIRUBINUR, KETONESUR, PROTEINUR, UROBILINOGEN, NITRITE, LEUKOCYTESUR in the last 72 hours.  Invalid input(s): APPERANCEUR    Imaging: US RENAL  Result Date: 03/08/2020 CLINICAL DATA:  Kidney injury. EXAM: RENAL / URINARY TRACT ULTRASOUND COMPLETE COMPARISON:  CT abdomen and pelvis 03/05/2020 FINDINGS: Right Kidney: Renal measurements: 10.3 x 5.4 x 4.5 cm = volume: 131 mL. Cortical thinning. Mildly increased parenchymal echogenicity. No hydronephrosis. 5.1 cm interpolar cyst and 1.1 cm lower pole cyst. Left Kidney: Renal measurements: 10.6 x 5.3 x 3.8 cm = volume: 112 mL. Cortical thinning. Mildly increased parenchymal echogenicity. No mass or hydronephrosis visualized. Bladder: Appears normal for degree of bladder distention. Other: None. IMPRESSION: 1. Bilateral renal cortical thinning and increased parenchymal echogenicity compatible with medical renal disease. No hydronephrosis. 2. Right renal cysts. Electronically Signed   By: Logan Bores M.D.   On: 03/08/2020 10:18   DG Chest Port 1 View  Result Date: 03/08/2020 CLINICAL DATA:  Hypoxia EXAM: PORTABLE CHEST 1 VIEW  COMPARISON:  03/05/2020, 04/29/2013 FINDINGS: There is marked elevation of the right hemidiaphragm, slightly progressive since prior examination which may relate to the patient's position (sitting) on the current examination. No superimposed focal pulmonary infiltrate. No pneumothorax or pleural effusion. Cardiac size is at the upper limits of normal, unchanged. Pulmonary vascularity is normal. No acute bone abnormality. IMPRESSION: Stable elevation of the right hemidiaphragm. Stable borderline cardiomegaly. Electronically Signed   By: Fidela Salisbury MD   On: 03/08/2020 10:41     Medications:   . sodium chloride 40 mL/hr at 03/08/20 1234  .  ceFAZolin (ANCEF) IV 1 g (03/09/20 0847)   . allopurinol  100 mg Oral Daily  . atorvastatin  20 mg Oral QPM  . budesonide (PULMICORT) nebulizer solution  0.5 mg Nebulization BID  . calcitRIOL  0.25 mcg Oral Daily  . diltiazem  180 mg Oral Daily  . heparin  5,000 Units Subcutaneous Q8H  . ipratropium-albuterol  3 mL Nebulization Q6H  . multivitamin-lutein  1 capsule Oral Daily  . pantoprazole  40 mg Oral Daily   acetaminophen **OR** acetaminophen, albuterol, ondansetron **OR** ondansetron (ZOFRAN) IV  Assessment/ Plan:  87 y.o. female with  was admitted on 03/05/2020 for  Active Problems:   UTI (urinary tract infection)   Sepsis due to Escherichia coli with acute renal failure without septic shock (HCC)   Acute diastolic CHF (congestive heart failure) (HCC)   Acute kidney injury superimposed on CKD (HCC)   Hypokalemia   Essential hypertension   Hyperlipidemia  Dehydration [E86.0] UTI (urinary tract infection) [N39.0] Nausea vomiting and diarrhea [R11.2, R19.7] Urinary tract infection without hematuria, site unspecified [N39.0]  #. Acute kidney injury in the setting of chronic kidney disease stage 4  believed to be caused by recent diarrhea and dehydration Minimal improvement today with kidney function Will continue IVF and consider d/c  tomorrow  #. UTI with sepsis Continues IV antibiotics Primary team will continue to manage this  #. Gastroenteritis Continues to deny diarrhea episodes since admission.  Colon Flattery, NP 2/11/202211:25 [place], [place]

## 2020-03-09 NOTE — Progress Notes (Signed)
Occupational Therapy Treatment Patient Details Name: Gloria Day MRN: [id number] DOB: 17-Oct-1928 Today's Date: 03/09/2020    History of present illness Pt is admitted for UTI with complaints of dysuria and diarrhea x 3 days. History includes CHF, COPD, and HTN. Pt reports increased SOB and wheezing the past few days.   OT comments  Ms Sirna was seen for OT treatment on this date. Upon arrival to room pt reclined in bed agreeable to tx. Pt requires CGA + RW for toilet t/f, MOD A perihygiene in standing - assist for rear only. Incontinent of urine during ~30 ft mobility, clean gown/socks provided. MIN A for LBD seated EOC - assist for threading over heels. Pt making good progress toward goals. Pt continues to benefit from skilled OT services to maximize return to PLOF and minimize risk of future falls, injury, caregiver burden, and readmission. Will continue to follow POC. Discharge recommendation remains appropriate.    Follow Up Recommendations  Home health OT;Supervision/Assistance - 24 hour    Equipment Recommendations  3 in 1 bedside commode    Recommendations for Other Services      Precautions / Restrictions Precautions Precautions: Fall Restrictions Weight Bearing Restrictions: No       Mobility Bed Mobility Overal bed mobility: Needs Assistance Bed Mobility: Supine to Sit;Sit to Supine     Supine to sit: Supervision Sit to supine: Supervision      Transfers Overall transfer level: Needs assistance Equipment used: Rolling walker (2 wheeled) Transfers: Sit to/from [facility] Sit to Stand: Min guard Stand pivot transfers: Min guard            Balance Overall balance assessment: Needs assistance Sitting-balance support: Feet supported Sitting balance-Leahy Scale: Good     Standing balance support: Single extremity supported;During functional activity Standing balance-Leahy Scale: Fair                             ADL either  performed or assessed with clinical judgement   ADL Overall ADL's : Needs assistance/impaired                                       General ADL Comments: CGA + RW for toilet t/f, MOD A perihygiene in standing - assist for rear only. MIN A for LBD seated EOC - assist for threading over heels.               Cognition Arousal/Alertness: Awake/alert Behavior During Therapy: WFL for tasks assessed/performed Overall Cognitive Status: Within Functional Limits for tasks assessed                                          Exercises Exercises: Other exercises Other Exercises Other Exercises: Pt educated re: OT role, DME recs, d/c recs, falls prevention, ECS, home/routines modifications Other Exercises: LBD, toileting, sit<>sup, sit<>stand x2, SPT, sitting/standing balance/tolerance           Pertinent Vitals/ Pain       Pain Assessment: No/denies pain         Frequency  Min 2X/week        Progress Toward Goals  OT Goals(current goals can now be found in the care plan section)  Progress towards OT goals: Progressing toward goals  Acute  Rehab OT Goals Patient Stated Goal: to go home OT Goal Formulation: With patient Time For Goal Achievement: 03/21/20 Potential to Achieve Goals: Good ADL Goals Pt Will Perform Grooming: with modified independence;sitting Pt Will Perform Lower Body Bathing: with supervision;with set-up;sitting/lateral leans Pt Will Transfer to Toilet: with modified independence;ambulating;regular height toilet  Plan Discharge plan remains appropriate;Frequency remains appropriate       AM-PAC OT "6 Clicks" Daily Activity     Outcome Measure   Help from another person eating meals?: A Little Help from another person taking care of personal grooming?: A Little Help from another person toileting, which includes using toliet, bedpan, or urinal?: A Lot Help from another person bathing (including washing, rinsing, drying)?: A  Little Help from another person to put on and taking off regular upper body clothing?: A Little Help from another person to put on and taking off regular lower body clothing?: A Little 6 Click Score: 17    End of Session Equipment Utilized During Treatment: Rolling walker  OT Visit Diagnosis: Other abnormalities of gait and mobility (R26.89);Muscle weakness (generalized) (M62.81)   Activity Tolerance Patient tolerated treatment well   Patient Left in bed;with call bell/phone within reach   Nurse Communication          Time: [id number] OT Time Calculation (min): 24 min  Charges: OT General Charges $OT Visit: 1 Visit OT Treatments $Self Care/Home Management : 23-37 mins  Dessie Coma, M.S. OTR/L  03/09/20, 3:22 PM  ascom [phone]

## 2020-03-09 NOTE — Progress Notes (Signed)
Physical Therapy Treatment Patient Details Name: Gloria Day MRN: [id number] DOB: Feb 04, 1928 Today's Date: 03/09/2020    History of Present Illness Pt is admitted for UTI with complaints of dysuria and diarrhea x 3 days. History includes CHF, COPD, and HTN. Pt reports increased SOB and wheezing the past few days.    PT Comments    Patient alert, family at bedside, denied pain. Pt demonstrated improved functional mobility and activity tolerance. Pt able to transfer to EOB with supervision. Brief donned in sitting due to incontinence with mobility, adjusted in standing with RW, CGA. She ambulated ~76f with RW and CGA, some coughing noted but overall no unsteadiness noted. Fatigue at end of ambulation, spO2 90% or greater throughout session on room air. Pt up in chair all needs in reach. The patient would benefit from further skilled PT intervention to continue to progress towards goals. Recommendation remains appropriate.       Follow Up Recommendations  Home health PT;Supervision/Assistance - 24 hour     Equipment Recommendations  None recommended by PT    Recommendations for Other Services       Precautions / Restrictions Precautions Precautions: Fall Restrictions Weight Bearing Restrictions: No    Mobility  Bed Mobility Overal bed mobility: Needs Assistance Bed Mobility: Supine to Sit;Sit to Supine     Supine to sit: Supervision Sit to supine: Supervision        Transfers Overall transfer level: Needs assistance Equipment used: Rolling walker (2 wheeled) Transfers: [facility] to[facility]from [facility] to Stand: Min guard Stand pivot transfers: Min guard       General transfer comment: brief donned in sitting and pulled up once standing  Ambulation/Gait Ambulation/Gait assistance: Min guard Gait Distance (Feet): 35 Feet Assistive device: Rolling walker (2 wheeled)       General Gait Details: spO2 90% or greater during and after  ambulation   Stairs             Wheelchair Mobility    Modified Rankin (Stroke Patients Only)       Balance Overall balance assessment: Needs assistance Sitting-balance support: Feet supported Sitting balance-Leahy Scale: Good     Standing balance support: Single extremity supported;During functional activity Standing balance-Leahy Scale: Fair                              Cognition Arousal/Alertness: Awake/alert Behavior During Therapy: WFL for tasks assessed/performed Overall Cognitive Status: Within Functional Limits for tasks assessed                                        Exercises Other Exercises Other Exercises: Pt educated re: OT role, DME recs, d/c recs, falls prevention, ECS, home/routines modifications Other Exercises: LBD, toileting, sit<>sup, sit<>stand x2, SPT, sitting/standing balance/tolerance    General Comments        Pertinent Vitals/Pain Pain Assessment: No/denies pain    Home Living                      Prior Function            PT Goals (current goals can now be found in the care plan section) Acute Rehab PT Goals Patient Stated Goal: to go home Progress towards PT goals: Progressing toward goals    Frequency    Min 2X/week  PT Plan Current plan remains appropriate    Co-evaluation              AM-PAC PT "6 Clicks" Mobility   Outcome Measure  Help needed turning from your back to your side while in a flat bed without using bedrails?: A Little Help needed moving from lying on your back to sitting on the side of a flat bed without using bedrails?: A Little Help needed moving to and from a bed to a chair (including a wheelchair)?: A Little Help needed standing up from a chair using your arms (e.g., wheelchair or bedside chair)?: A Little Help needed to walk in hospital room?: A Little Help needed climbing 3-5 steps with a railing? : A Lot 6 Click Score: 17    End of  Session Equipment Utilized During Treatment: Gait belt Activity Tolerance: Patient limited by fatigue Patient left: in bed;with family/visitor present Nurse Communication: Mobility status PT Visit Diagnosis: Unsteadiness on feet (R26.81);Muscle weakness (generalized) (M62.81);Difficulty in walking, not elsewhere classified (R26.2)     Time: [id number] PT Time Calculation (min) (ACUTE ONLY): 18 min  Charges:  $Therapeutic Exercise: 8-22 mins                     Lieutenant Diego PT, DPT 4:25 PM,03/09/20

## 2020-03-10 ENCOUNTER — Inpatient Hospital Stay: Payer: Medicare HMO

## 2020-03-10 DIAGNOSIS — N179 Acute kidney failure, unspecified: Secondary | ICD-10-CM | POA: Diagnosis not present

## 2020-03-10 DIAGNOSIS — A4151 Sepsis due to Escherichia coli [E. coli]: Secondary | ICD-10-CM | POA: Diagnosis not present

## 2020-03-10 DIAGNOSIS — N3001 Acute cystitis with hematuria: Secondary | ICD-10-CM | POA: Diagnosis not present

## 2020-03-10 DIAGNOSIS — I5031 Acute diastolic (congestive) heart failure: Secondary | ICD-10-CM | POA: Diagnosis not present

## 2020-03-10 LAB — BASIC METABOLIC PANEL
Anion gap: 12 (ref 5–15)
BUN: 74 mg/dL — ABNORMAL HIGH (ref 8–23)
CO2: 24 mmol/L (ref 22–32)
Calcium: 8.8 mg/dL — ABNORMAL LOW (ref 8.9–10.3)
Chloride: 107 mmol/L (ref 98–111)
Creatinine, Ser: 2.58 mg/dL — ABNORMAL HIGH (ref 0.44–1.00)
GFR, Estimated: 17 mL/min — ABNORMAL LOW (ref >60)
Glucose, Bld: 129 mg/dL — ABNORMAL HIGH (ref 70–99)
Potassium: 4.4 mmol/L (ref 3.5–5.1)
Sodium: 143 mmol/L (ref 135–145)

## 2020-03-10 LAB — CBC
HCT: 32.3 % — ABNORMAL LOW (ref 36.0–46.0)
Hemoglobin: 10.9 g/dL — ABNORMAL LOW (ref 12.0–15.0)
MCH: 29.8 pg (ref 26.0–34.0)
MCHC: 33.7 g/dL (ref 30.0–36.0)
MCV: 88.3 fL (ref 80.0–100.0)
Platelets: 182 10*3/uL (ref 150–400)
RBC: 3.66 MIL/uL — ABNORMAL LOW (ref 3.87–5.11)
RDW: 13.7 % (ref 11.5–15.5)
WBC: 15.5 10*3/uL — ABNORMAL HIGH (ref 4.0–10.5)
nRBC: 0 % (ref 0.0–0.2)

## 2020-03-10 LAB — BRAIN NATRIURETIC PEPTIDE: B Natriuretic Peptide: 463.8 pg/mL — ABNORMAL HIGH (ref 0.0–100.0)

## 2020-03-10 LAB — GLUCOSE, CAPILLARY: Glucose-Capillary: 104 mg/dL — ABNORMAL HIGH (ref 70–99)

## 2020-03-10 LAB — PROCALCITONIN: Procalcitonin: 11.32 ng/mL

## 2020-03-10 LAB — LACTIC ACID, PLASMA: Lactic Acid, Venous: 2.4 mmol/L (ref 0.5–1.9)

## 2020-03-10 MED ORDER — GUAIFENESIN-DM 100-10 MG/5ML PO SYRP
5.0000 mL | ORAL_SOLUTION | ORAL | Status: DC | PRN
  Filled 2020-03-10: qty 5

## 2020-03-10 MED ORDER — DM-GUAIFENESIN ER 30-600 MG PO TB12
1.0000 | ORAL_TABLET | Freq: Two times a day (BID) | ORAL | Status: DC
  Administered 2020-03-10 – 2020-03-14 (×9): 1 via ORAL
  Filled 2020-03-10 (×10): qty 1

## 2020-03-10 MED ORDER — FLUTICASONE PROPIONATE 50 MCG/ACT NA SUSP
2.0000 | Freq: Every day | NASAL | Status: DC
  Administered 2020-03-10 – 2020-03-14 (×5): 2 via NASAL
  Filled 2020-03-10: qty 16

## 2020-03-10 MED ORDER — LORATADINE 10 MG PO TABS
10.0000 mg | ORAL_TABLET | Freq: Every day | ORAL | Status: DC
  Administered 2020-03-10 – 2020-03-14 (×5): 10 mg via ORAL
  Filled 2020-03-10 (×5): qty 1

## 2020-03-10 NOTE — Progress Notes (Signed)
[facility]  ROUNDING NOTE   Subjective:   Patient reports a cough with occasional sputum production, feels like she is more wheezy. She is not sure when she received her last breasting treatment.  Not currently on supplemental oxygen.   Denies any dysuria.    Objective:  Vital signs in last 24 hours:  Temp:  [97.5 F (36.4 C)-98 F (36.7 C)] 97.8 F (36.6 C) (02/12 0808) Pulse Rate:  [80-97] 90 (02/12 0814) Resp:  [15-19] 18 (02/12 0814) BP: (138-162)/(67-81) 162/80 (02/12 0808) SpO2:  [92 %-100 %] 93 % (02/12 0808)  Weight change:  Filed Weights   03/05/20 1649  Weight: 95.3 kg    Intake/Output: I/O last 3 completed shifts: In: -  Out: 950 [Urine:950]   Intake/Output this shift:  Total I/O In: 240 [P.O.:240] Out: -   Physical Exam: General: NAD, lying in bed.   Head: Normocephalic, atraumatic. Moist oral mucosal membranes  Eyes: Anicteric, PERRL  Neck: Supple, trachea midline  Lungs:  +wheezes bilateral   Heart: Regular rate and rhythm   Abdomen:  Soft, nontender,   Extremities:  No peripheral edema.  Neurologic: Nonfocal, moving all four extremities  Skin: No lesions       Basic Metabolic Panel: Recent Labs  Lab 03/05/20 1447 03/06/20 0628 03/08/20 0705 03/09/20 0516 03/10/20 0512  NA 134* 136 139 141 143  K 3.6 3.1* 4.3 4.0 4.4  CL 93* 97* 101 104 107  CO2 '22 25 26 25 24  '$ GLUCOSE 147* 120* 152* 128* 129*  BUN 47* 53* 70* 72* 74*  CREATININE 3.21* 3.37* 3.11* 2.90* 2.58*  CALCIUM 8.5* 7.9* 8.3* 8.7* 8.8*  MG  --   --  1.7 1.8  --     Liver Function Tests: Recent Labs  Lab 03/05/20 1447 03/06/20 0628  AST 37 26  ALT 22 12  ALKPHOS 111 103  BILITOT 1.4* 1.2  PROT 6.3* 5.4*  ALBUMIN 3.2* 2.7*   No results for input(s): LIPASE, AMYLASE in the last 168 hours. No results for input(s): AMMONIA in the last 168 hours.  CBC: Recent Labs  Lab 03/05/20 1447 03/05/20 2221 03/06/20 0628 03/08/20 0705 03/09/20 0516  03/10/20 0512  WBC 24.3* 14.8* 17.1* 16.5* 12.9* 15.5*  NEUTROABS 20.7*  --   --  14.8*  --   --   HGB 12.2 11.4* 10.8* 9.8* 10.2* 10.9*  HCT 37.3 35.1* 32.5* 30.4* 31.2* 32.3*  MCV 88.8 89.5 88.8 89.7 88.9 88.3  PLT 148* 113* 124* 139* 156 182    Cardiac Enzymes: No results for input(s): CKTOTAL, CKMB, CKMBINDEX, TROPONINI in the last 168 hours.  BNP: Invalid input(s): POCBNP  CBG: Recent Labs  Lab 03/06/20 0747 03/07/20 0758 03/08/20 0802 03/09/20 0817 03/10/20 0811  GLUCAP 114* 148* 137* 115* 104*    Microbiology: Results for orders placed or performed during the hospital encounter of 03/05/20  SARS Coronavirus 2 by RT PCR (hospital order, performed in [facility] hospital lab) Nasopharyngeal Nasopharyngeal Swab     Status: None   Collection Time: 03/05/20  6:15 PM   Specimen: Nasopharyngeal Swab  Result Value Ref Range Status   SARS Coronavirus 2 NEGATIVE NEGATIVE Final    Comment: (NOTE) SARS-CoV-2 target nucleic acids are NOT DETECTED.  The SARS-CoV-2 RNA is generally detectable in upper and lower respiratory specimens during the acute phase of infection. The lowest concentration of SARS-CoV-2 viral copies this assay can detect is 250 copies / mL. A negative result does not preclude  SARS-CoV-2 infection and should not be used as the sole basis for treatment or other patient management decisions.  A negative result may occur with improper specimen collection / handling, submission of specimen other than nasopharyngeal swab, presence of viral mutation(s) within the areas targeted by this assay, and inadequate number of viral copies (<250 copies / mL). A negative result must be combined with clinical observations, patient history, and epidemiological information.  Fact Sheet for Patients:   StrictlyIdeas.no  Fact Sheet for Healthcare Providers: BankingDealers.co.za  This test is not yet approved or  cleared by the  [place] FDA and has been authorized for detection and/or diagnosis of SARS-CoV-2 by FDA under an Emergency Use Authorization (EUA).  This EUA will remain in effect (meaning this test can be used) for the duration of the COVID-19 declaration under Section 564(b)(1) of the Act, 21 U.S.C. section 360bbb-3(b)(1), unless the authorization is terminated or revoked sooner.  Performed at [facility], [address], [place], [place] [address]   Blood Culture (routine x 2)     Status: Abnormal   Collection Time: 03/05/20  6:15 PM   Specimen: BLOOD  Result Value Ref Range Status   Specimen Description   Final    BLOOD LEFT ASSIST CONTROL Performed at [facility], [address] [place], [place] [address]    Special Requests   Final    BOTTLES DRAWN AEROBIC AND ANAEROBIC Blood Culture results may not be optimal due to an inadequate volume of blood received in culture bottles Performed at [facility], [address] [place], [place] [address]    Culture  Setup Time   Final    GRAM NEGATIVE RODS IN BOTH AEROBIC AND ANAEROBIC BOTTLES CRITICAL RESULT CALLED TO, READ BACK BY AND VERIFIED WITHLloyd Huger AT [id number] 03/06/20 SDR Performed at [facility], [place] [address] [place], [place] [address]    Culture ESCHERICHIA COLI (A)  Final   Report Status 03/08/2020 FINAL  Final   Organism ID, Bacteria ESCHERICHIA COLI  Final      Susceptibility   Escherichia coli - MIC*    AMPICILLIN >=32 RESISTANT Resistant     CEFAZOLIN <=4 SENSITIVE Sensitive     CEFEPIME <=0.12 SENSITIVE Sensitive     CEFTAZIDIME <=1 SENSITIVE Sensitive     CEFTRIAXONE <=0.25 SENSITIVE Sensitive     CIPROFLOXACIN <=0.25 SENSITIVE Sensitive     GENTAMICIN <=1 SENSITIVE Sensitive     IMIPENEM <=0.25 SENSITIVE Sensitive     TRIMETH/SULFA <=20 SENSITIVE Sensitive     AMPICILLIN/SULBACTAM >=32 RESISTANT Resistant     PIP/TAZO <=4 SENSITIVE Sensitive     * ESCHERICHIA COLI  Blood  Culture (routine x 2)     Status: Abnormal   Collection Time: 03/05/20  6:15 PM   Specimen: BLOOD  Result Value Ref Range Status   Specimen Description   Final    BLOOD RIGHT ASSIST CONTROL Performed at [facility] [address], [place], [place] [address]    Special Requests   Final    BOTTLES DRAWN AEROBIC AND ANAEROBIC Blood Culture results may not be optimal due to an inadequate volume of blood received in culture bottles Performed at [facility], [address] [place],  [address]    Culture  Setup Time   Final    GRAM NEGATIVE RODS IN BOTH AEROBIC AND ANAEROBIC BOTTLES CRITICAL VALUE NOTED.  VALUE IS CONSISTENT WITH PREVIOUSLY REPORTED AND CALLED VALUE. Performed at [facility], [place], [place] [address]    Culture (A)  Final    ESCHERICHIA COLI SUSCEPTIBILITIES PERFORMED ON PREVIOUS CULTURE WITHIN THE LAST 5 DAYS. Performed at [facility], [place] [address] [place], [place] [address]    Report Status 03/08/2020 FINAL  Final  Blood Culture ID Panel (Reflexed)     Status: Abnormal   Collection Time: 03/05/20  6:15 PM  Result Value Ref Range Status   Enterococcus faecalis NOT DETECTED NOT DETECTED Final   Enterococcus Faecium NOT DETECTED NOT DETECTED Final   Listeria monocytogenes NOT DETECTED NOT DETECTED Final   Staphylococcus species NOT DETECTED NOT DETECTED Final   Staphylococcus aureus (BCID) NOT DETECTED NOT DETECTED Final   Staphylococcus epidermidis NOT DETECTED NOT DETECTED Final   Staphylococcus lugdunensis NOT DETECTED NOT DETECTED Final   Streptococcus species NOT DETECTED NOT DETECTED Final   Streptococcus agalactiae NOT DETECTED NOT DETECTED Final   Streptococcus pneumoniae NOT DETECTED NOT DETECTED Final   Streptococcus pyogenes NOT DETECTED NOT DETECTED Final   A.calcoaceticus-baumannii NOT DETECTED NOT DETECTED Final   Bacteroides fragilis NOT DETECTED NOT DETECTED Final   Enterobacterales  DETECTED (A) NOT DETECTED Final    Comment: Enterobacterales represent a large order of gram negative bacteria, not a single organism. CRITICAL RESULT CALLED TO, READ BACK BY AND VERIFIED WITH:  NATHAN BELUE AT [id number] 03/06/20 SDR    Enterobacter cloacae complex NOT DETECTED NOT DETECTED Final   Escherichia coli DETECTED (A) NOT DETECTED Final    Comment: CRITICAL RESULT CALLED TO, READ BACK BY AND VERIFIED WITH:  NATHAN BELUE AT [id number] 03/06/20 SDR    Klebsiella aerogenes NOT DETECTED NOT DETECTED Final   Klebsiella oxytoca NOT DETECTED NOT DETECTED Final   Klebsiella pneumoniae NOT DETECTED NOT DETECTED Final   Proteus species NOT DETECTED NOT DETECTED Final   Salmonella species NOT DETECTED NOT DETECTED Final   Serratia marcescens NOT DETECTED NOT DETECTED Final   Haemophilus influenzae NOT DETECTED NOT DETECTED Final   Neisseria meningitidis NOT DETECTED NOT DETECTED Final   Pseudomonas aeruginosa NOT DETECTED NOT DETECTED Final   Stenotrophomonas maltophilia NOT DETECTED NOT DETECTED Final   Candida albicans NOT DETECTED NOT DETECTED Final   Candida auris NOT DETECTED NOT DETECTED Final   Candida glabrata NOT DETECTED NOT DETECTED Final   Candida krusei NOT DETECTED NOT DETECTED Final   Candida parapsilosis NOT DETECTED NOT DETECTED Final   Candida tropicalis NOT DETECTED NOT DETECTED Final   Cryptococcus neoformans/gattii NOT DETECTED NOT DETECTED Final   CTX-M ESBL NOT DETECTED NOT DETECTED Final   Carbapenem resistance IMP NOT DETECTED NOT DETECTED Final   Carbapenem resistance KPC NOT DETECTED NOT DETECTED Final   Carbapenem resistance NDM NOT DETECTED NOT DETECTED Final   Carbapenem resist OXA 48 LIKE NOT DETECTED NOT DETECTED Final   Carbapenem resistance VIM NOT DETECTED NOT DETECTED Final    Comment: Performed at [facility], [address] [place], [place] [address]    Coagulation Studies: No results for input(s): LABPROT, INR in the last 72  hours.  Urinalysis: No results for input(s): COLORURINE, LABSPEC, PHURINE, GLUCOSEU, HGBUR, BILIRUBINUR, KETONESUR, PROTEINUR, UROBILINOGEN, NITRITE, LEUKOCYTESUR in the last 72 hours.  Invalid input(s): APPERANCEUR    Imaging: No results found.   Medications:   . sodium chloride 40 mL/hr at 03/10/20 0602   . allopurinol  100 mg Oral Daily  . atorvastatin  20 mg Oral QPM  . budesonide (PULMICORT) nebulizer solution  0.5 mg Nebulization  BID  . calcitRIOL  0.25 mcg Oral Daily  . cephALEXin  500 mg Oral Q12H  . dextromethorphan-guaiFENesin  1 tablet Oral BID  . diltiazem  180 mg Oral Daily  . fluticasone  2 spray Each Nare Daily  . heparin  5,000 Units Subcutaneous Q8H  . ipratropium-albuterol  3 mL Nebulization TID  . loratadine  10 mg Oral Daily  . multivitamin-lutein  1 capsule Oral Daily  . pantoprazole  40 mg Oral Daily   acetaminophen **OR** acetaminophen, albuterol, guaiFENesin-dextromethorphan, ondansetron **OR** ondansetron (ZOFRAN) IV  Assessment/ Plan:  Ms. Gloria Day is a 87 y.o.  female with PMHX significant of COPD, CHF per, HTN, CKD stage 4, gout, and anemia of chronic disease. She presents to the ED with c/o nausea, vomiting, diarrhea, and dysuria for 2-3 days.   1. Acute kidney injury in the setting of chronic kidney disease stage 4  believed to be caused by recent diarrhea and dehydration Baseline creatinine of 1.8 in Nov 2021.  Creatinine elevated at 2.58 , peaked at 3.37 on 03/07/19, will reevaluate need for IV fluids.   2. Hypertension  - currently on diltiazem   2. UTI with sepsis Continue Antibiotics : currently on keflex oral  Primary team will continue to manage this   3. Gastroenteritis No diarrhea      LOS: 5 Jashon Ishida P Louise Victory 2/12/202212:12 PM

## 2020-03-10 NOTE — Plan of Care (Signed)

## 2020-03-10 NOTE — Progress Notes (Signed)
Gloria Day   [id number]  DOB: 07-Feb-1928  PCP: Sofie Hartigan, MD    DOA: 03/05/2020 LOS: 5   Brief Narrative   87 y.o. female with medical history of diastolic CHF, COPD not on home O2 , HTN, CKD stage IV, gout, anemia of chronic disease, who presented to the ED from urgent care with complaints of worsening nausea, vomiting, diarrhea and dysuria x 2-3 days. Urgent care referred patient to the ED due to low BP on 90/60 and UA showing infection and labs showing worsened renal function and leukocytosis.  Patient also reported dyspnea worse than baseline, but no hypoxia was noted.      Significant Events: - Admitted 03/05/2020  Assessment & Plan   Active Problems:   UTI (urinary tract infection)   Sepsis due to Escherichia coli with acute renal failure without septic shock (HCC)   Acute diastolic CHF (congestive heart failure) (HCC)   Acute kidney injury superimposed on CKD ([place])   Hypokalemia   Essential hypertension   Hyperlipidemia   Severe sepsis due to E. coli bacteremia and UTI -presented with severe sepsis as evidenced by leukocytosis, tachycardia, fever in the setting of UTI.  AKI superimposed on CKD stage IV reflects organ dysfunction consistent with severe sepsis.  Sepsis physiology improved. Abx: Rocephin >> Ancef --Change antibiotic to Ancef based on culture, Keflex at d/c  --Complete 7 day course antibiotics given uncomplicated GNR bacteremia --Follow repeat cultures - neg to date  Leukocytosis - white count rising past couple of days, up to 15.5 today.  Pt not feeling well generally, worse cough --On abx as above --Monitor CBC --Monitor clinically for s/sx's of infection  Cough - suspect UACS (upper airway cough syndrome) due to allergic rhinitis and post-nasal drip +/- ?volume overload.   --Repeat CXR --Single blood culture and UA  Acute on chronic diastolic CHF -IV fluids previously held.  Patient appears euvolemic on exam.    Echo - EF 70-75% hyperdynamic LVEF systolic function, grade 1 diastolic dysfunction. --Monitor volume status closely, on fluids for AKI as below  Acute respiratory failure with hypoxia - pt not on supplemental oxygen at baseline, requiring 2 L/min currently, along with  subjective shortness of breath.   Pt has elevated R hemidiaphragm, and not mobilizing therefore atelectasis contributes to hypoxia.  No sign of PNA on CXR.  Doubt PE without tachycardia or hemodynamic changes; dyspnea also is stable. 2/10 repeat Chest xray improved vascular congestion --wean oxygen as tolerated, supplement O2 to keep sats >=88%  COPD with mild acute exacerbation - had mild wheezing 2/8, treated with IV steroid, exacerbation resolved.  Per chart review, appears pt used to be prescribed inhalers but stopped them on her own as she did not feel they were helpful. --d/w pt and daughter re initiating maintenance inhalers at d/c --PRN albuterol for now --d/c IV steroid for now and monitor --Duonebs q6h, Pulmicort nebs BID  AKI superimposed on CKD stage IV -patient received some IV fluids on arrival.  Was subsequently placed on oxygen so fluids were stopped.  Nephrology is consulted.  Monitor BMP.  2/10 - resumed on some IV fluids since EF normal.  Hold Lasix.  Hypokalemia -potassium was replaced.  Monitor and replace as needed.  Diarrhea -present on admission, unclear etiology.  Stool studies were ordered and pending. Patient denies further episodes of diarrhea since admission.  Monitor.  Generalized weakness-evaluated by physical therapy and home health PT with 24-hour supervision was recommended.  TOC following for disposition planning.  Hyperlipidemia -continue Zocor  Essential hypertension -antihypertensive medications initially held due to soft BP.  BP's elevated today.  Resume Cardizem.  Hold Lasix.  Monitor BP and resume when indicated.  Nausea vomiting -reported on admission, improved.  Likely secondary  to above.  Antiemetics as needed.   Obesity: Body mass index is 36.05 kg/m.  Complicates overall care and prognosis.  Recommend physical activity and diet efforts towards weight loss.  Primary care follow-up.  DVT prophylaxis: heparin injection 5,000 Units Start: 03/05/20 2215   Diet:  Diet Orders (From admission, onward)    Start     Ordered   03/06/20 1149  Diet Heart Room service appropriate? Yes; Fluid consistency: Thin  Diet effective now       Question Answer Comment  Room service appropriate? Yes   Fluid consistency: Thin      03/06/20 1149            Code Status: DNR    Subjective 03/10/20    Patient reports an increase in her cough and says she just doesn't feel well past couple of days, worse today.  Does endorse chronic cough and post-nasal drainage, does not use allergy medication.   Disposition Plan & Communication   Status is: Inpatient  Remains inpatient appropriate because:IV treatments appropriate due to intensity of illness or inability to take PO.   Persistent acute on chronic renal failure requires close monitoring, worsening cough.   Dispo: The patient is from: Home              Anticipated d/c is to: Home              Anticipated d/c date is: ~2 days              Patient currently is not medically stable to d/c.   Difficult to place patient No  Family communication - daughter at bedside on rounds 2/11   Consults, Procedures, Significant Events   Consultants:   Nephrology  Procedures:   none  Antimicrobials:  Anti-infectives (From admission, onward)   Start     Dose/Rate Route Frequency Ordered Stop   03/09/20 2200  cephALEXin (KEFLEX) capsule 500 mg        500 mg Oral Every 12 hours 03/09/20 1253 03/16/20 0959   03/09/20 0800  ceFAZolin (ANCEF) IVPB 1 g/50 mL premix  Status:  Discontinued        1 g 100 mL/hr over 30 Minutes Intravenous Every 12 hours 03/08/20 0932 03/09/20 1253   03/06/20 1000  cefTRIAXone (ROCEPHIN) 2 g in  sodium chloride 0.9 % 100 mL IVPB        2 g 200 mL/hr over 30 Minutes Intravenous Every 24 hours 03/06/20 0745 03/08/20 0936   03/05/20 1730  cefTRIAXone (ROCEPHIN) 1 g in sodium chloride 0.9 % 100 mL IVPB  Status:  Discontinued        1 g 200 mL/hr over 30 Minutes Intravenous Every 24 hours 03/05/20 1716 03/06/20 0745        Objective   Vitals:   03/10/20 0808 03/10/20 0814 03/10/20 1220 03/10/20 1513  BP: (!) 162/80  (!) 157/94 (!) 152/81  Pulse: 80 90 91 89  Resp: '18 18 16 16  '$ Temp: 97.8 F (36.6 C)  97.7 F (36.5 C) 97.8 F (36.6 C)  TempSrc:      SpO2: 93%  95% 95%  Weight:      Height:  Intake/Output Summary (Last 24 hours) at 03/10/2020 1933 Last data filed at 03/10/2020 1413 Gross per 24 hour  Intake 420 ml  Output 700 ml  Net -280 ml   Filed Weights   03/05/20 1649  Weight: 95.3 kg    Physical Exam:  General exam: awake, alert, no acute distress Respiratory system: referred upper airway sounds, diminished bases R>L, coarse sounding cough, on room air, normal respiratory effort. Cardiovascular system: normal S1/S2, RRR, no pedal edema.  Gastrointestinal system: soft, NT, ND Central nervous system: normal speech, CN's grossly intact, grossly nonfocal exam   Labs   Data Reviewed: I have personally reviewed following labs and imaging studies  CBC: Recent Labs  Lab 03/05/20 1447 03/05/20 2221 03/06/20 0628 03/08/20 0705 03/09/20 0516 03/10/20 0512  WBC 24.3* 14.8* 17.1* 16.5* 12.9* 15.5*  NEUTROABS 20.7*  --   --  14.8*  --   --   HGB 12.2 11.4* 10.8* 9.8* 10.2* 10.9*  HCT 37.3 35.1* 32.5* 30.4* 31.2* 32.3*  MCV 88.8 89.5 88.8 89.7 88.9 88.3  PLT 148* 113* 124* 139* 156 [id number]   Basic Metabolic Panel: Recent Labs  Lab 03/05/20 1447 03/06/20 0628 03/08/20 0705 03/09/20 0516 03/10/20 0512  NA 134* 136 139 141 143  K 3.6 3.1* 4.3 4.0 4.4  CL 93* 97* 101 104 107  CO2 '22 25 26 25 24  '$ GLUCOSE 147* 120* 152* 128* 129*  BUN 47* 53* 70* 72*  74*  CREATININE 3.21* 3.37* 3.11* 2.90* 2.58*  CALCIUM 8.5* 7.9* 8.3* 8.7* 8.8*  MG  --   --  1.7 1.8  --    GFR: Estimated Creatinine Clearance: 16.5 mL/min (A) (by C-G formula based on SCr of 2.58 mg/dL (H)). Liver Function Tests: Recent Labs  Lab 03/05/20 1447 03/06/20 0628  AST 37 26  ALT 22 12  ALKPHOS 111 103  BILITOT 1.4* 1.2  PROT 6.3* 5.4*  ALBUMIN 3.2* 2.7*   No results for input(s): LIPASE, AMYLASE in the last 168 hours. No results for input(s): AMMONIA in the last 168 hours. Coagulation Profile: No results for input(s): INR, PROTIME in the last 168 hours. Cardiac Enzymes: No results for input(s): CKTOTAL, CKMB, CKMBINDEX, TROPONINI in the last 168 hours. BNP (last 3 results) No results for input(s): PROBNP in the last 8760 hours. HbA1C: No results for input(s): HGBA1C in the last 72 hours. CBG: Recent Labs  Lab 03/06/20 0747 03/07/20 0758 03/08/20 0802 03/09/20 0817 03/10/20 0811  GLUCAP 114* 148* 137* 115* 104*   Lipid Profile: No results for input(s): CHOL, HDL, LDLCALC, TRIG, CHOLHDL, LDLDIRECT in the last 72 hours. Thyroid Function Tests: No results for input(s): TSH, T4TOTAL, FREET4, T3FREE, THYROIDAB in the last 72 hours. Anemia Panel: No results for input(s): VITAMINB12, FOLATE, FERRITIN, TIBC, IRON, RETICCTPCT in the last 72 hours. Sepsis Labs: Recent Labs  Lab 03/05/20 1815 03/10/20 1138  PROCALCITON  --  11.32  LATICACIDVEN 1.6 2.4*    Recent Results (from the past 240 hour(s))  Urine culture     Status: Abnormal   Collection Time: 03/05/20  2:09 PM   Specimen: Urine, Clean Catch  Result Value Ref Range Status   Specimen Description   Final    URINE, CLEAN CATCH Performed at [facility], [address] [place], [place] [address]    Special Requests   Final    NONE Performed at [facility], [place] [address] [place], [place] [address]    Culture >=100,000 COLONIES/mL ESCHERICHIA COLI (  A)  Final   Report  Status 03/08/2020 FINAL  Final   Organism ID, Bacteria ESCHERICHIA COLI (A)  Final      Susceptibility   Escherichia coli - MIC*    AMPICILLIN >=32 RESISTANT Resistant     CEFAZOLIN <=4 SENSITIVE Sensitive     CEFEPIME <=0.12 SENSITIVE Sensitive     CEFTRIAXONE <=0.25 SENSITIVE Sensitive     CIPROFLOXACIN <=0.25 SENSITIVE Sensitive     GENTAMICIN <=1 SENSITIVE Sensitive     IMIPENEM <=0.25 SENSITIVE Sensitive     NITROFURANTOIN <=16 SENSITIVE Sensitive     TRIMETH/SULFA <=20 SENSITIVE Sensitive     AMPICILLIN/SULBACTAM >=32 RESISTANT Resistant     PIP/TAZO <=4 SENSITIVE Sensitive     * >=100,000 COLONIES/mL ESCHERICHIA COLI  SARS Coronavirus 2 by RT PCR (hospital order, performed in [place] hospital lab) Nasopharyngeal Nasopharyngeal Swab     Status: None   Collection Time: 03/05/20  6:15 PM   Specimen: Nasopharyngeal Swab  Result Value Ref Range Status   SARS Coronavirus 2 NEGATIVE NEGATIVE Final    Comment: (NOTE) SARS-CoV-2 target nucleic acids are NOT DETECTED.  The SARS-CoV-2 RNA is generally detectable in upper and lower respiratory specimens during the acute phase of infection. The lowest concentration of SARS-CoV-2 viral copies this assay can detect is 250 copies / mL. A negative result does not preclude SARS-CoV-2 infection and should not be used as the sole basis for treatment or other patient management decisions.  A negative result may occur with improper specimen collection / handling, submission of specimen other than nasopharyngeal swab, presence of viral mutation(s) within the areas targeted by this assay, and inadequate number of viral copies (<250 copies / mL). A negative result must be combined with clinical observations, patient history, and epidemiological information.  Fact Sheet for Patients:   StrictlyIdeas.no  Fact Sheet for Healthcare Providers: BankingDealers.co.za  This test is not yet approved or   cleared by the [place] FDA and has been authorized for detection and/or diagnosis of SARS-CoV-2 by FDA under an Emergency Use Authorization (EUA).  This EUA will remain in effect (meaning this test can be used) for the duration of the COVID-19 declaration under Section 564(b)(1) of the Act, 21 U.S.C. section 360bbb-3(b)(1), unless the authorization is terminated or revoked sooner.  Performed at [facility], [address], [place], [place] [address]   Blood Culture (routine x 2)     Status: Abnormal   Collection Time: 03/05/20  6:15 PM   Specimen: BLOOD  Result Value Ref Range Status   Specimen Description   Final    BLOOD LEFT ASSIST CONTROL Performed at [facility], [address] [place], [place] [address]    Special Requests   Final    BOTTLES DRAWN AEROBIC AND ANAEROBIC Blood Culture results may not be optimal due to an inadequate volume of blood received in culture bottles Performed at [facility], [address] [place], [place] [address]    Culture  Setup Time   Final    GRAM NEGATIVE RODS IN BOTH AEROBIC AND ANAEROBIC BOTTLES CRITICAL RESULT CALLED TO, READ BACK BY AND VERIFIED WITHLloyd Huger AT [id number] 03/06/20 SDR Performed at [facility], [place] [address] [place], [place] [address]    Culture ESCHERICHIA COLI (A)  Final   Report Status 03/08/2020 FINAL  Final   Organism ID, Bacteria ESCHERICHIA COLI  Final      Susceptibility   Escherichia coli - MIC*  AMPICILLIN >=32 RESISTANT Resistant     CEFAZOLIN <=4 SENSITIVE Sensitive     CEFEPIME <=0.12 SENSITIVE Sensitive     CEFTAZIDIME <=1 SENSITIVE Sensitive     CEFTRIAXONE <=0.25 SENSITIVE Sensitive     CIPROFLOXACIN <=0.25 SENSITIVE Sensitive     GENTAMICIN <=1 SENSITIVE Sensitive     IMIPENEM <=0.25 SENSITIVE Sensitive     TRIMETH/SULFA <=20 SENSITIVE Sensitive     AMPICILLIN/SULBACTAM >=32 RESISTANT Resistant     PIP/TAZO <=4 SENSITIVE Sensitive     * ESCHERICHIA  COLI  Blood Culture (routine x 2)     Status: Abnormal   Collection Time: 03/05/20  6:15 PM   Specimen: BLOOD  Result Value Ref Range Status   Specimen Description   Final    BLOOD RIGHT ASSIST CONTROL Performed at [facility], [address] [place], [place] [address]    Special Requests   Final    BOTTLES DRAWN AEROBIC AND ANAEROBIC Blood Culture results may not be optimal due to an inadequate volume of blood received in culture bottles Performed at [facility], [address] [place], [place] [address]    Culture  Setup Time   Final    GRAM NEGATIVE RODS IN BOTH AEROBIC AND ANAEROBIC BOTTLES CRITICAL VALUE NOTED.  VALUE IS CONSISTENT WITH PREVIOUSLY REPORTED AND CALLED VALUE. Performed at [facility], [address] [place], [place] [address]    Culture (A)  Final    ESCHERICHIA COLI SUSCEPTIBILITIES PERFORMED ON PREVIOUS CULTURE WITHIN THE LAST 5 DAYS. Performed at [facility], [place] [address], [place], [place] [address]    Report Status 03/08/2020 FINAL  Final  Blood Culture ID Panel (Reflexed)     Status: Abnormal   Collection Time: 03/05/20  6:15 PM  Result Value Ref Range Status   Enterococcus faecalis NOT DETECTED NOT DETECTED Final   Enterococcus Faecium NOT DETECTED NOT DETECTED Final   Listeria monocytogenes NOT DETECTED NOT DETECTED Final   Staphylococcus species NOT DETECTED NOT DETECTED Final   Staphylococcus aureus (BCID) NOT DETECTED NOT DETECTED Final   Staphylococcus epidermidis NOT DETECTED NOT DETECTED Final   Staphylococcus lugdunensis NOT DETECTED NOT DETECTED Final   Streptococcus species NOT DETECTED NOT DETECTED Final   Streptococcus agalactiae NOT DETECTED NOT DETECTED Final   Streptococcus pneumoniae NOT DETECTED NOT DETECTED Final   Streptococcus pyogenes NOT DETECTED NOT DETECTED Final   A.calcoaceticus-baumannii NOT DETECTED NOT DETECTED Final   Bacteroides fragilis NOT DETECTED NOT DETECTED Final    Enterobacterales DETECTED (A) NOT DETECTED Final    Comment: Enterobacterales represent a large order of gram negative bacteria, not a single organism. CRITICAL RESULT CALLED TO, READ BACK BY AND VERIFIED WITH:  NATHAN BELUE AT [id number] 03/06/20 SDR    Enterobacter cloacae complex NOT DETECTED NOT DETECTED Final   Escherichia coli DETECTED (A) NOT DETECTED Final    Comment: CRITICAL RESULT CALLED TO, READ BACK BY AND VERIFIED WITH:  NATHAN BELUE AT [id number] 03/06/20 SDR    Klebsiella aerogenes NOT DETECTED NOT DETECTED Final   Klebsiella oxytoca NOT DETECTED NOT DETECTED Final   Klebsiella pneumoniae NOT DETECTED NOT DETECTED Final   Proteus species NOT DETECTED NOT DETECTED Final   Salmonella species NOT DETECTED NOT DETECTED Final   Serratia marcescens NOT DETECTED NOT DETECTED Final   Haemophilus influenzae NOT DETECTED NOT DETECTED Final   Neisseria meningitidis NOT DETECTED NOT DETECTED Final   Pseudomonas aeruginosa NOT DETECTED NOT DETECTED Final   Stenotrophomonas maltophilia NOT DETECTED  NOT DETECTED Final   Candida albicans NOT DETECTED NOT DETECTED Final   Candida auris NOT DETECTED NOT DETECTED Final   Candida glabrata NOT DETECTED NOT DETECTED Final   Candida krusei NOT DETECTED NOT DETECTED Final   Candida parapsilosis NOT DETECTED NOT DETECTED Final   Candida tropicalis NOT DETECTED NOT DETECTED Final   Cryptococcus neoformans/gattii NOT DETECTED NOT DETECTED Final   CTX-M ESBL NOT DETECTED NOT DETECTED Final   Carbapenem resistance IMP NOT DETECTED NOT DETECTED Final   Carbapenem resistance KPC NOT DETECTED NOT DETECTED Final   Carbapenem resistance NDM NOT DETECTED NOT DETECTED Final   Carbapenem resist OXA 48 LIKE NOT DETECTED NOT DETECTED Final   Carbapenem resistance VIM NOT DETECTED NOT DETECTED Final    Comment: Performed at [facility], [address] [place], [place] [address]      Imaging Studies   DG Chest Port 1 View  Result Date:  03/10/2020 CLINICAL DATA:  CHF, cough, COPD EXAM: PORTABLE CHEST 1 VIEW COMPARISON:  03/08/2020 chest radiograph. FINDINGS: Stable cardiomediastinal silhouette with top-normal heart size. No pneumothorax. Stable moderate elevation of the right hemidiaphragm. Chronic right basilar moderate atelectasis. No overt pulmonary edema. No acute consolidative airspace disease. IMPRESSION: Chronic moderate elevation of the right hemidiaphragm with right basilar atelectasis. No acute cardiopulmonary disease. Electronically Signed   By: Ilona Sorrel M.D.   On: 03/10/2020 14:10     Medications   Scheduled Meds: . allopurinol  100 mg Oral Daily  . atorvastatin  20 mg Oral QPM  . budesonide (PULMICORT) nebulizer solution  0.5 mg Nebulization BID  . calcitRIOL  0.25 mcg Oral Daily  . cephALEXin  500 mg Oral Q12H  . dextromethorphan-guaiFENesin  1 tablet Oral BID  . diltiazem  180 mg Oral Daily  . fluticasone  2 spray Each Nare Daily  . heparin  5,000 Units Subcutaneous Q8H  . ipratropium-albuterol  3 mL Nebulization TID  . loratadine  10 mg Oral Daily  . multivitamin-lutein  1 capsule Oral Daily  . pantoprazole  40 mg Oral Daily   Continuous Infusions:      LOS: 5 days    Time spent: 25 minutes with greater than 50% spent at bedside and in coordination of care.    Ezekiel Slocumb, DO [facility]  03/10/2020, 7:33 PM      If 7PM-7AM, please contact night-coverage. How to contact the [facility] Attending or Consulting provider [place] or covering provider during after hours [place], for this patient?    1. Check the care team in [facility] and look for a) attending/consulting [facility] provider listed and b) the [facility] team listed 2. Log into [email or web address] and use 's universal password to access. If you do not have the password, please contact the hospital operator. 3. Locate the [facility] provider you are looking for under [facility] and page to a number that you can be directly  reached. 4. If you still have difficulty reaching the provider, please page the [facility] (Director on Call) for the Hospitalists listed on amion for assistance.

## 2020-03-11 DIAGNOSIS — N179 Acute kidney failure, unspecified: Secondary | ICD-10-CM | POA: Diagnosis not present

## 2020-03-11 DIAGNOSIS — A4151 Sepsis due to Escherichia coli [E. coli]: Secondary | ICD-10-CM | POA: Diagnosis not present

## 2020-03-11 DIAGNOSIS — N189 Chronic kidney disease, unspecified: Secondary | ICD-10-CM | POA: Diagnosis not present

## 2020-03-11 DIAGNOSIS — N3001 Acute cystitis with hematuria: Secondary | ICD-10-CM | POA: Diagnosis not present

## 2020-03-11 LAB — CBC
HCT: 33.6 % — ABNORMAL LOW (ref 36.0–46.0)
Hemoglobin: 10.7 g/dL — ABNORMAL LOW (ref 12.0–15.0)
MCH: 28.8 pg (ref 26.0–34.0)
MCHC: 31.8 g/dL (ref 30.0–36.0)
MCV: 90.3 fL (ref 80.0–100.0)
Platelets: 207 10*3/uL (ref 150–400)
RBC: 3.72 MIL/uL — ABNORMAL LOW (ref 3.87–5.11)
RDW: 14 % (ref 11.5–15.5)
WBC: 19.4 10*3/uL — ABNORMAL HIGH (ref 4.0–10.5)
nRBC: 0.1 % (ref 0.0–0.2)

## 2020-03-11 LAB — CBC WITH DIFFERENTIAL/PLATELET
Abs Immature Granulocytes: 2.66 10*3/uL — ABNORMAL HIGH (ref 0.00–0.07)
Basophils Absolute: 0.2 10*3/uL — ABNORMAL HIGH (ref 0.0–0.1)
Basophils Relative: 1 %
Eosinophils Absolute: 0.4 10*3/uL (ref 0.0–0.5)
Eosinophils Relative: 2 %
HCT: 34.3 % — ABNORMAL LOW (ref 36.0–46.0)
Hemoglobin: 10.8 g/dL — ABNORMAL LOW (ref 12.0–15.0)
Immature Granulocytes: 14 %
Lymphocytes Relative: 7 %
Lymphs Abs: 1.4 10*3/uL (ref 0.7–4.0)
MCH: 28.6 pg (ref 26.0–34.0)
MCHC: 31.5 g/dL (ref 30.0–36.0)
MCV: 91 fL (ref 80.0–100.0)
Monocytes Absolute: 1 10*3/uL (ref 0.1–1.0)
Monocytes Relative: 5 %
Neutro Abs: 13.5 10*3/uL — ABNORMAL HIGH (ref 1.7–7.7)
Neutrophils Relative %: 71 %
Platelets: 214 10*3/uL (ref 150–400)
RBC: 3.77 MIL/uL — ABNORMAL LOW (ref 3.87–5.11)
RDW: 14 % (ref 11.5–15.5)
Smear Review: NORMAL
WBC: 19.2 10*3/uL — ABNORMAL HIGH (ref 4.0–10.5)
nRBC: 0 % (ref 0.0–0.2)

## 2020-03-11 LAB — BASIC METABOLIC PANEL
Anion gap: 12 (ref 5–15)
BUN: 70 mg/dL — ABNORMAL HIGH (ref 8–23)
CO2: 23 mmol/L (ref 22–32)
Calcium: 8.7 mg/dL — ABNORMAL LOW (ref 8.9–10.3)
Chloride: 108 mmol/L (ref 98–111)
Creatinine, Ser: 2.36 mg/dL — ABNORMAL HIGH (ref 0.44–1.00)
GFR, Estimated: 19 mL/min — ABNORMAL LOW (ref >60)
Glucose, Bld: 75 mg/dL (ref 70–99)
Potassium: 4 mmol/L (ref 3.5–5.1)
Sodium: 143 mmol/L (ref 135–145)

## 2020-03-11 LAB — GLUCOSE, CAPILLARY: Glucose-Capillary: 81 mg/dL (ref 70–99)

## 2020-03-11 LAB — SARS CORONAVIRUS 2 (TAT 6-24 HRS): SARS Coronavirus 2: NEGATIVE

## 2020-03-11 MED ORDER — DILTIAZEM HCL ER COATED BEADS 240 MG PO CP24
240.0000 mg | ORAL_CAPSULE | Freq: Every day | ORAL | Status: DC
  Administered 2020-03-12 – 2020-03-14 (×3): 240 mg via ORAL
  Filled 2020-03-11 (×3): qty 1

## 2020-03-11 MED ORDER — SODIUM CHLORIDE 0.9 % IV SOLN
500.0000 mg | INTRAVENOUS | Status: DC
  Administered 2020-03-11 – 2020-03-13 (×3): 500 mg via INTRAVENOUS
  Filled 2020-03-11 (×5): qty 500

## 2020-03-11 MED ORDER — SODIUM CHLORIDE 0.9 % IV SOLN
INTRAVENOUS | Status: DC | PRN
  Administered 2020-03-11: 1000 mL via INTRAVENOUS

## 2020-03-11 NOTE — Progress Notes (Addendum)
PROGRESS NOTE    Gloria Day   [id number]  DOB: 1928/08/03  PCP: Sofie Hartigan, MD    DOA: 03/05/2020 LOS: 6   Brief Narrative   87 y.o. female with medical history of diastolic CHF, COPD not on home O2 , HTN, CKD stage IV, gout, anemia of chronic disease, who presented to the ED from urgent care with complaints of worsening nausea, vomiting, diarrhea and dysuria x 2-3 days. Urgent care referred patient to the ED due to low BP on 90/60 and UA showing infection and labs showing worsened renal function and leukocytosis.  Patient also reported dyspnea worse than baseline, but no hypoxia was noted.      Significant Events: - Admitted 03/05/2020  Assessment & Plan   Active Problems:   UTI (urinary tract infection)   Sepsis due to Escherichia coli with acute renal failure without septic shock (HCC)   Acute diastolic CHF (congestive heart failure) (HCC)   Acute kidney injury superimposed on CKD ([facility])   Hypokalemia   Essential hypertension   Hyperlipidemia   Severe sepsis due to E. coli bacteremia and UTI -presented with severe sepsis as evidenced by leukocytosis, tachycardia, fever in the setting of UTI.  AKI superimposed on CKD stage IV reflects organ dysfunction consistent with severe sepsis.  Sepsis physiology improved. Abx: Rocephin >> Ancef >> Keflex --Continue Keflex to complete 7 days given uncomplicated GNR bacteremia --Follow repeat cultures - neg to date  Leukocytosis - white count rising past couple of days, up to 15.5>>19 today.  2/11-12 pt didn't feel well, cough worse, generally more fatigued.  Chest xray's stable.   Discussed with ID 2/13, suggested re-test Covid, agree w/cultures.   --On abx as above --Add Zithromax for ?bronchitis --Repeat Covid swab --Follow up pending blood culture --Monitor CBC --Monitor clinically for s/sx's of infection  Cough - suspect UACS (upper airway cough syndrome) due to allergic rhinitis and post-nasal drip +/- ?volume  overload.  Repeat CXR stable.  Cough seems improved with trial of Mucinex, loratadine and Flonase.   Acute on chronic diastolic CHF -IV fluids previously held.  Patient appears euvolemic on exam.   Echo - EF 70-75% hyperdynamic LVEF systolic function, grade 1 diastolic dysfunction. --Monitor volume status closely, on fluids for AKI as below  Acute respiratory failure with hypoxia - Resolved.  Pt not on supplemental oxygen at baseline, needed 2 L/min along with subjective shortness of breath.  Weaned off. Pt has elevated R hemidiaphragm, and not mobilizing therefore atelectasis contributes to hypoxia.  No sign of PNA on CXR.  Doubt PE without tachycardia or hemodynamic changes; dyspnea also is stable. Repeat chest xrays have been stable without signs PNA  COPD with mild acute exacerbation - had mild wheezing 2/8, treated with IV steroid, exacerbation resolved.  Per chart review, appears pt used to be prescribed inhalers but stopped them on her own as she did not feel they were helpful.  Pt has been prescribed inhalers in past, did not find them very helpful.   --PRN albuterol  --off steroids and has been stable --Duonebs q6h, Pulmicort nebs BID  AKI superimposed on CKD stage IV -patient received some IV fluids on arrival.  Was subsequently placed on oxygen so fluids were stopped.  Nephrology is consulted.  Monitor BMP.  2/10 - resumed on some IV fluids since EF normal.  Hold Lasix.  Hypokalemia -potassium was replaced.  Monitor and replace as needed.  Diarrhea -present on admission, unclear etiology.  Stool studies were ordered  and pending. Patient denies further episodes of diarrhea since admission.  Monitor.  Generalized weakness-evaluated by physical therapy and home health PT with 24-hour supervision was recommended.  Pt lives with daughter, will have 24/7 care. --Continue PT   Hyperlipidemia -continue Zocor  Essential hypertension -antihypertensive medications initially held due to  soft BP.  BP's elevated today.  Resume Cardizem.  Hold Lasix.  Monitor BP and resume when indicated.  Nausea vomiting -reported on admission, improved.  Likely secondary to above.  Antiemetics as needed.  Constipation - stool softeners, PRN laxatives  Obesity: Body mass index is 36.05 kg/m.  Complicates overall care and prognosis.  Recommend physical activity and diet efforts towards weight loss.  Primary care follow-up.  DVT prophylaxis: heparin injection 5,000 Units Start: 03/05/20 2215   Diet:  Diet Orders (From admission, onward)    Start     Ordered   03/06/20 1149  Diet Heart Room service appropriate? Yes; Fluid consistency: Thin  Diet effective now       Question Answer Comment  Room service appropriate? Yes   Fluid consistency: Thin      03/06/20 1149            Code Status: DNR    Subjective 03/11/20    Patient reports constipation, seen while on bedside commode.  Says her cough a little better today, phlegm starting to break up.  No fevers or chills.   Disposition Plan & Communication   Status is: Inpatient  Remains inpatient appropriate because: Persistent acute on chronic renal failure monitoring, worsening leukocytosis under evaluation   Dispo: The patient is from: Home              Anticipated d/c is to: Home              Anticipated d/c date is: ~2 days              Patient currently is not medically stable to d/c.   Difficult to place patient No  Family communication - daughter at bedside on rounds 2/11   Consults, Procedures, Significant Events   Consultants:   Nephrology  Procedures:   none  Antimicrobials:  Anti-infectives (From admission, onward)   Start     Dose/Rate Route Frequency Ordered Stop   03/11/20 1000  azithromycin (ZITHROMAX) 500 mg in sodium chloride 0.9 % 250 mL IVPB        500 mg 250 mL/hr over 60 Minutes Intravenous Every 24 hours 03/11/20 0904     03/09/20 2200  cephALEXin (KEFLEX) capsule 500 mg        500 mg Oral  Every 12 hours 03/09/20 1253 03/16/20 0959   03/09/20 0800  ceFAZolin (ANCEF) IVPB 1 g/50 mL premix  Status:  Discontinued        1 g 100 mL/hr over 30 Minutes Intravenous Every 12 hours 03/08/20 0932 03/09/20 1253   03/06/20 1000  cefTRIAXone (ROCEPHIN) 2 g in sodium chloride 0.9 % 100 mL IVPB        2 g 200 mL/hr over 30 Minutes Intravenous Every 24 hours 03/06/20 0745 03/08/20 0936   03/05/20 1730  cefTRIAXone (ROCEPHIN) 1 g in sodium chloride 0.9 % 100 mL IVPB  Status:  Discontinued        1 g 200 mL/hr over 30 Minutes Intravenous Every 24 hours 03/05/20 1716 03/06/20 0745        Objective   Vitals:   03/11/20 0749 03/11/20 0826 03/11/20 1120 03/11/20 1558  BP: Marland Kitchen)  170/91  (!) 165/79 139/72  Pulse: 94 96 (!) 108 95  Resp: '17 18 16 19  '$ Temp: 97.8 F (36.6 C)  98.1 F (36.7 C) 97.8 F (36.6 C)  TempSrc:    Oral  SpO2: 94% 92% 98% 96%  Weight:      Height:        Intake/Output Summary (Last 24 hours) at 03/11/2020 2008 Last data filed at 03/11/2020 0500 Gross per 24 hour  Intake --  Output 800 ml  Net -800 ml   Filed Weights   03/05/20 1649  Weight: 95.3 kg    Physical Exam:  General exam: on bedside commode, no acute distress, obese Respiratory system: clear bilaterally without wheezing or rhonchi, cough sounds dry today, on room air, normal respiratory effort. Cardiovascular system: normal S1/S2, RRR, no pedal edema.  Gastrointestinal system: soft, NT, ND, +bowel sounds Central nervous system: normal speech, CN's grossly intact, grossly nonfocal exam   Labs   Data Reviewed: I have personally reviewed following labs and imaging studies  CBC: Recent Labs  Lab 03/05/20 1447 03/05/20 2221 03/06/20 0628 03/08/20 0705 03/09/20 0516 03/10/20 0512 03/11/20 0526  WBC 24.3*   < > 17.1* 16.5* 12.9* 15.5* 19.2*  19.4*  NEUTROABS 20.7*  --   --  14.8*  --   --  13.5*  HGB 12.2   < > 10.8* 9.8* 10.2* 10.9* 10.8*  10.7*  HCT 37.3   < > 32.5* 30.4* 31.2* 32.3*  34.3*  33.6*  MCV 88.8   < > 88.8 89.7 88.9 88.3 91.0  90.3  PLT 148*   < > 124* 139* 156 182 214  207   < > = values in this interval not displayed.   Basic Metabolic Panel: Recent Labs  Lab 03/06/20 0628 03/08/20 0705 03/09/20 0516 03/10/20 0512 03/11/20 0526  NA 136 139 141 143 143  K 3.1* 4.3 4.0 4.4 4.0  CL 97* 101 104 107 108  CO2 '25 26 25 24 23  '$ GLUCOSE 120* 152* 128* 129* 75  BUN 53* 70* 72* 74* 70*  CREATININE 3.37* 3.11* 2.90* 2.58* 2.36*  CALCIUM 7.9* 8.3* 8.7* 8.8* 8.7*  MG  --  1.7 1.8  --   --    GFR: Estimated Creatinine Clearance: 18.1 mL/min (A) (by C-G formula based on SCr of 2.36 mg/dL (H)). Liver Function Tests: Recent Labs  Lab 03/05/20 1447 03/06/20 0628  AST 37 26  ALT 22 12  ALKPHOS 111 103  BILITOT 1.4* 1.2  PROT 6.3* 5.4*  ALBUMIN 3.2* 2.7*   No results for input(s): LIPASE, AMYLASE in the last 168 hours. No results for input(s): AMMONIA in the last 168 hours. Coagulation Profile: No results for input(s): INR, PROTIME in the last 168 hours. Cardiac Enzymes: No results for input(s): CKTOTAL, CKMB, CKMBINDEX, TROPONINI in the last 168 hours. BNP (last 3 results) No results for input(s): PROBNP in the last 8760 hours. HbA1C: No results for input(s): HGBA1C in the last 72 hours. CBG: Recent Labs  Lab 03/07/20 0758 03/08/20 0802 03/09/20 0817 03/10/20 0811 03/11/20 0750  GLUCAP 148* 137* 115* 104* 81   Lipid Profile: No results for input(s): CHOL, HDL, LDLCALC, TRIG, CHOLHDL, LDLDIRECT in the last 72 hours. Thyroid Function Tests: No results for input(s): TSH, T4TOTAL, FREET4, T3FREE, THYROIDAB in the last 72 hours. Anemia Panel: No results for input(s): VITAMINB12, FOLATE, FERRITIN, TIBC, IRON, RETICCTPCT in the last 72 hours. Sepsis Labs: Recent Labs  Lab 03/05/20 1815 03/10/20 1138  PROCALCITON  --  11.32  LATICACIDVEN 1.6 2.4*    Recent Results (from the past 240 hour(s))  Urine culture     Status: Abnormal    Collection Time: 03/05/20  2:09 PM   Specimen: Urine, Clean Catch  Result Value Ref Range Status   Specimen Description   Final    URINE, CLEAN CATCH Performed at [facility], [address] [place], [place] [address]    Special Requests   Final    NONE Performed at [facility], [place] [address], [place], [place] [address]    Culture >=100,000 COLONIES/mL ESCHERICHIA COLI (A)  Final   Report Status 03/08/2020 FINAL  Final   Organism ID, Bacteria ESCHERICHIA COLI (A)  Final      Susceptibility   Escherichia coli - MIC*    AMPICILLIN >=32 RESISTANT Resistant     CEFAZOLIN <=4 SENSITIVE Sensitive     CEFEPIME <=0.12 SENSITIVE Sensitive     CEFTRIAXONE <=0.25 SENSITIVE Sensitive     CIPROFLOXACIN <=0.25 SENSITIVE Sensitive     GENTAMICIN <=1 SENSITIVE Sensitive     IMIPENEM <=0.25 SENSITIVE Sensitive     NITROFURANTOIN <=16 SENSITIVE Sensitive     TRIMETH/SULFA <=20 SENSITIVE Sensitive     AMPICILLIN/SULBACTAM >=32 RESISTANT Resistant     PIP/TAZO <=4 SENSITIVE Sensitive     * >=100,000 COLONIES/mL ESCHERICHIA COLI  SARS Coronavirus 2 by RT PCR (hospital order, performed in [facility] hospital lab) Nasopharyngeal Nasopharyngeal Swab     Status: None   Collection Time: 03/05/20  6:15 PM   Specimen: Nasopharyngeal Swab  Result Value Ref Range Status   SARS Coronavirus 2 NEGATIVE NEGATIVE Final    Comment: (NOTE) SARS-CoV-2 target nucleic acids are NOT DETECTED.  The SARS-CoV-2 RNA is generally detectable in upper and lower respiratory specimens during the acute phase of infection. The lowest concentration of SARS-CoV-2 viral copies this assay can detect is 250 copies / mL. A negative result does not preclude SARS-CoV-2 infection and should not be used as the sole basis for treatment or other patient management decisions.  A negative result may occur with improper specimen collection / handling, submission of specimen other than nasopharyngeal swab, presence  of viral mutation(s) within the areas targeted by this assay, and inadequate number of viral copies (<250 copies / mL). A negative result must be combined with clinical observations, patient history, and epidemiological information.  Fact Sheet for Patients:   StrictlyIdeas.no  Fact Sheet for Healthcare Providers: BankingDealers.co.za  This test is not yet approved or  cleared by the [place] FDA and has been authorized for detection and/or diagnosis of SARS-CoV-2 by FDA under an Emergency Use Authorization (EUA).  This EUA will remain in effect (meaning this test can be used) for the duration of the COVID-19 declaration under Section 564(b)(1) of the Act, 21 U.S.C. section 360bbb-3(b)(1), unless the authorization is terminated or revoked sooner.  Performed at [facility], [address] [place], [place] [address]   Blood Culture (routine x 2)     Status: Abnormal   Collection Time: 03/05/20  6:15 PM   Specimen: BLOOD  Result Value Ref Range Status   Specimen Description   Final    BLOOD LEFT ASSIST CONTROL Performed at [facility], [address] [place],  [address]    Special Requests   Final    BOTTLES DRAWN AEROBIC AND ANAEROBIC Blood Culture results may not be optimal due to an inadequate volume of blood  received in culture bottles Performed at [facility], [address] [place], [place] [address]    Culture  Setup Time   Final    GRAM NEGATIVE RODS IN BOTH AEROBIC AND ANAEROBIC BOTTLES CRITICAL RESULT CALLED TO, READ BACK BY AND VERIFIED WITHLloyd Huger AT [id number] 03/06/20 SDR Performed at [facility], [place] [address], [place], [place] [address]    Culture ESCHERICHIA COLI (A)  Final   Report Status 03/08/2020 FINAL  Final   Organism ID, Bacteria ESCHERICHIA COLI  Final      Susceptibility   Escherichia coli - MIC*    AMPICILLIN >=32 RESISTANT Resistant     CEFAZOLIN  <=4 SENSITIVE Sensitive     CEFEPIME <=0.12 SENSITIVE Sensitive     CEFTAZIDIME <=1 SENSITIVE Sensitive     CEFTRIAXONE <=0.25 SENSITIVE Sensitive     CIPROFLOXACIN <=0.25 SENSITIVE Sensitive     GENTAMICIN <=1 SENSITIVE Sensitive     IMIPENEM <=0.25 SENSITIVE Sensitive     TRIMETH/SULFA <=20 SENSITIVE Sensitive     AMPICILLIN/SULBACTAM >=32 RESISTANT Resistant     PIP/TAZO <=4 SENSITIVE Sensitive     * ESCHERICHIA COLI  Blood Culture (routine x 2)     Status: Abnormal   Collection Time: 03/05/20  6:15 PM   Specimen: BLOOD  Result Value Ref Range Status   Specimen Description   Final    BLOOD RIGHT ASSIST CONTROL Performed at [facility], [address] [place], [place] [address]    Special Requests   Final    BOTTLES DRAWN AEROBIC AND ANAEROBIC Blood Culture results may not be optimal due to an inadequate volume of blood received in culture bottles Performed at [facility], [address] [place], [place] [address]    Culture  Setup Time   Final    GRAM NEGATIVE RODS IN BOTH AEROBIC AND ANAEROBIC BOTTLES CRITICAL VALUE NOTED.  VALUE IS CONSISTENT WITH PREVIOUSLY REPORTED AND CALLED VALUE. Performed at [facility], [address] [place], [place] [address]    Culture (A)  Final    ESCHERICHIA COLI SUSCEPTIBILITIES PERFORMED ON PREVIOUS CULTURE WITHIN THE LAST 5 DAYS. Performed at [facility], [place] [address], [place],  [address]    Report Status 03/08/2020 FINAL  Final  Blood Culture ID Panel (Reflexed)     Status: Abnormal   Collection Time: 03/05/20  6:15 PM  Result Value Ref Range Status   Enterococcus faecalis NOT DETECTED NOT DETECTED Final   Enterococcus Faecium NOT DETECTED NOT DETECTED Final   Listeria monocytogenes NOT DETECTED NOT DETECTED Final   Staphylococcus species NOT DETECTED NOT DETECTED Final   Staphylococcus aureus (BCID) NOT DETECTED NOT DETECTED Final   Staphylococcus epidermidis NOT DETECTED NOT  DETECTED Final   Staphylococcus lugdunensis NOT DETECTED NOT DETECTED Final   Streptococcus species NOT DETECTED NOT DETECTED Final   Streptococcus agalactiae NOT DETECTED NOT DETECTED Final   Streptococcus pneumoniae NOT DETECTED NOT DETECTED Final   Streptococcus pyogenes NOT DETECTED NOT DETECTED Final   A.calcoaceticus-baumannii NOT DETECTED NOT DETECTED Final   Bacteroides fragilis NOT DETECTED NOT DETECTED Final   Enterobacterales DETECTED (A) NOT DETECTED Final    Comment: Enterobacterales represent a large order of gram negative bacteria, not a single organism. CRITICAL RESULT CALLED TO, READ BACK BY AND VERIFIED WITH:  NATHAN BELUE AT [id number] 03/06/20 SDR    Enterobacter cloacae complex NOT DETECTED NOT DETECTED Final   Escherichia coli DETECTED (A) NOT DETECTED Final  Comment: CRITICAL RESULT CALLED TO, READ BACK BY AND VERIFIED WITH:  NATHAN BELUE AT [id number] 03/06/20 SDR    Klebsiella aerogenes NOT DETECTED NOT DETECTED Final   Klebsiella oxytoca NOT DETECTED NOT DETECTED Final   Klebsiella pneumoniae NOT DETECTED NOT DETECTED Final   Proteus species NOT DETECTED NOT DETECTED Final   Salmonella species NOT DETECTED NOT DETECTED Final   Serratia marcescens NOT DETECTED NOT DETECTED Final   Haemophilus influenzae NOT DETECTED NOT DETECTED Final   Neisseria meningitidis NOT DETECTED NOT DETECTED Final   Pseudomonas aeruginosa NOT DETECTED NOT DETECTED Final   Stenotrophomonas maltophilia NOT DETECTED NOT DETECTED Final   Candida albicans NOT DETECTED NOT DETECTED Final   Candida auris NOT DETECTED NOT DETECTED Final   Candida glabrata NOT DETECTED NOT DETECTED Final   Candida krusei NOT DETECTED NOT DETECTED Final   Candida parapsilosis NOT DETECTED NOT DETECTED Final   Candida tropicalis NOT DETECTED NOT DETECTED Final   Cryptococcus neoformans/gattii NOT DETECTED NOT DETECTED Final   CTX-M ESBL NOT DETECTED NOT DETECTED Final   Carbapenem resistance IMP NOT DETECTED NOT DETECTED  Final   Carbapenem resistance KPC NOT DETECTED NOT DETECTED Final   Carbapenem resistance NDM NOT DETECTED NOT DETECTED Final   Carbapenem resist OXA 48 LIKE NOT DETECTED NOT DETECTED Final   Carbapenem resistance VIM NOT DETECTED NOT DETECTED Final    Comment: Performed at [facility], [address] [place], [place] [address]  Culture, blood (single) w Reflex to ID Panel     Status: None (Preliminary result)   Collection Time: 03/10/20  3:44 PM   Specimen: BLOOD  Result Value Ref Range Status   Specimen Description BLOOD BLOOD LEFT HAND  Final   Special Requests   Final    BOTTLES DRAWN AEROBIC AND ANAEROBIC Blood Culture adequate volume   Culture   Final    NO GROWTH < 24 HOURS Performed at [facility], [address] [place], [place] [address]    Report Status PENDING  Incomplete      Imaging Studies   DG Chest Port 1 View  Result Date: 03/10/2020 CLINICAL DATA:  CHF, cough, COPD EXAM: PORTABLE CHEST 1 VIEW COMPARISON:  03/08/2020 chest radiograph. FINDINGS: Stable cardiomediastinal silhouette with top-normal heart size. No pneumothorax. Stable moderate elevation of the right hemidiaphragm. Chronic right basilar moderate atelectasis. No overt pulmonary edema. No acute consolidative airspace disease. IMPRESSION: Chronic moderate elevation of the right hemidiaphragm with right basilar atelectasis. No acute cardiopulmonary disease. Electronically Signed   By: Ilona Sorrel M.D.   On: 03/10/2020 14:10     Medications   Scheduled Meds: . allopurinol  100 mg Oral Daily  . atorvastatin  20 mg Oral QPM  . budesonide (PULMICORT) nebulizer solution  0.5 mg Nebulization BID  . calcitRIOL  0.25 mcg Oral Daily  . cephALEXin  500 mg Oral Q12H  . dextromethorphan-guaiFENesin  1 tablet Oral BID  . diltiazem  180 mg Oral Daily  . fluticasone  2 spray Each Nare Daily  . heparin  5,000 Units Subcutaneous Q8H  . ipratropium-albuterol  3 mL Nebulization TID  .  loratadine  10 mg Oral Daily  . multivitamin-lutein  1 capsule Oral Daily  . pantoprazole  40 mg Oral Daily   Continuous Infusions: . sodium chloride 1,000 mL (03/11/20 1235)  . azithromycin 500 mg (03/11/20 1237)       LOS: 6 days    Time spent: 30 minutes with greater than 50% spent at  bedside and in coordination of care.    Ezekiel Slocumb, DO [facility]  03/11/2020, 8:08 PM      If 7PM-7AM, please contact night-coverage. How to contact the [facility] Attending or Consulting provider [place] or covering provider during after hours [place], for this patient?    1. Check the care team in [facility] and look for a) attending/consulting [facility] provider listed and b) the [facility] team listed 2. Log into [email or web address] and use [place]'s universal password to access. If you do not have the password, please contact the hospital operator. 3. Locate the [facility] provider you are looking for under [facility] and page to a number that you can be directly reached. 4. If you still have difficulty reaching the provider, please page the [facility] (Director on Call) for the Hospitalists listed on amion for assistance.

## 2020-03-11 NOTE — Progress Notes (Signed)
Physical Therapy Treatment Patient Details Name: Gloria Day MRN: [id number] DOB: 17-Jul-1928 Today's Date: 03/11/2020    History of Present Illness Pt is admitted for UTI with complaints of dysuria and diarrhea x 3 days. History includes CHF, COPD, and HTN. Pt reports increased SOB and wheezing the past few days.    PT Comments    Pt requires heavy encouragement for participation in session. Pt c/o feeling "swimmy headed" at rest in bed & with mobility but BP stable. Pt is able to perform seated & standing exercises for strengthening and ambulates around bed to recliner with RW & CGA/supervision. Pt then requests to eat lunch. Will continue to follow pt acutely to progress gait as able.   Supine in bed: BP 155/78 mmHg  SpO2 95%  After ambulating to recliner: BP 152/89 mmHg HR 102 bpm SpO2 96%   Follow Up Recommendations  Home health PT;Supervision/Assistance - 24 hour     Equipment Recommendations  None recommended by PT    Recommendations for Other Services       Precautions / Restrictions Precautions Precautions: Fall Restrictions Weight Bearing Restrictions: No    Mobility  Bed Mobility Overal bed mobility: Needs Assistance Bed Mobility: Supine to Sit     Supine to sit: Supervision;HOB elevated     General bed mobility comments: uses bed rails, extra time required    Transfers Overall transfer level: Needs assistance Equipment used: Rolling walker (2 wheeled) Transfers: Sit to/from Stand           General transfer comment: decreased safety awareness during transfer, pt with BUE hands on RW pushing to stand vs 1 hand on stable surface  Ambulation/Gait Ambulation/Gait assistance: Min guard;Supervision Gait Distance (Feet): 15 Feet Assistive device: Rolling walker (2 wheeled) Gait Pattern/deviations: Decreased step length - left;Decreased stride length Gait velocity: decreased       Stairs             Wheelchair Mobility    Modified  Rankin (Stroke Patients Only)       Balance Overall balance assessment: Needs assistance Sitting-balance support: Feet supported Sitting balance-Leahy Scale: Good Sitting balance - Comments: supervision sitting EOB   Standing balance support: During functional activity;Bilateral upper extremity supported Standing balance-Leahy Scale: Fair Standing balance comment: UE support on RW during gait                            Cognition Arousal/Alertness: Awake/alert Behavior During Therapy: WFL for tasks assessed/performed Overall Cognitive Status: Within Functional Limits for tasks assessed                                        Exercises General Exercises - Lower Extremity Long Arc Quad: AROM;Strengthening;Right;Left;Both;5 reps;Seated Hip Flexion/Marching: AROM;Strengthening;Right;Left;Both;10 reps;Standing (BUE support on RW)    General Comments General comments (skin integrity, edema, etc.): Pt c/o feeling "swimmy headed" at rest in bed & throughout session, Nurse in room & made aware, SPO2 >90% on room air throughout session      Pertinent Vitals/Pain Pain Assessment: No/denies pain    Home Living                      Prior Function            PT Goals (current goals can now be found in the care plan section) Acute Rehab  PT Goals Patient Stated Goal: to go home PT Goal Formulation: With patient Time For Goal Achievement: 03/20/20 Potential to Achieve Goals: Good Progress towards PT goals: Progressing toward goals    Frequency    Min 2X/week      PT Plan Current plan remains appropriate    Co-evaluation              AM-PAC PT "6 Clicks" Mobility   Outcome Measure  Help needed turning from your back to your side while in a flat bed without using bedrails?: None Help needed moving from lying on your back to sitting on the side of a flat bed without using bedrails?: A Little Help needed moving to and from a bed to a  chair (including a wheelchair)?: A Little Help needed standing up from a chair using your arms (e.g., wheelchair or bedside chair)?: None Help needed to walk in hospital room?: A Little Help needed climbing 3-5 steps with a railing? : A Lot 6 Click Score: 19    End of Session Equipment Utilized During Treatment: Gait belt Activity Tolerance: Patient limited by fatigue Patient left: in chair;with call bell/phone within reach;with chair alarm set;with family/visitor present   PT Visit Diagnosis: Unsteadiness on feet (R26.81);Muscle weakness (generalized) (M62.81);Difficulty in walking, not elsewhere classified (R26.2)     Time: [id number] PT Time Calculation (min) (ACUTE ONLY): 20 min  Charges:  $Therapeutic Activity: 8-22 mins                     Lavone Nian, PT, DPT 03/11/20, 1:48 PM    Waunita Schooner 03/11/2020, 1:46 PM

## 2020-03-11 NOTE — Progress Notes (Signed)
[facility]  ROUNDING NOTE   Subjective:   Patient reports her cough is slightly better. Does not feel like she is as wheezy as she was yesterday. Reports she is still taking her breathing treatments.  Denies any dysuria. Reports she has some constipation.   Objective:  Vital signs in last 24 hours:  Temp:  [97.4 F (36.3 C)-98.1 F (36.7 C)] 98.1 F (36.7 C) (02/13 1120) Pulse Rate:  [89-108] 108 (02/13 1120) Resp:  [16-19] 16 (02/13 1120) BP: (152-180)/(71-94) 165/79 (02/13 1120) SpO2:  [92 %-98 %] 98 % (02/13 1120)  Weight change:  Filed Weights   03/05/20 1649  Weight: 95.3 kg    Intake/Output: I/O last 3 completed shifts: In: 420 [P.O.:420] Out: 1500 [Urine:1500]   Intake/Output this shift:  No intake/output data recorded.  Physical Exam: General: NAD, sitting up in bed.  Head: Normocephalic, atraumatic. Moist oral mucosal membranes  Eyes: Anicteric, PERRL  Neck: Supple, trachea midline  Lungs:  +Rhonchi, no crackles   Heart: Regular rate and rhythm  Abdomen:  Soft, nontender,   Extremities:  No peripheral edema.  Neurologic: Alert and oriented   Skin: No lesions       Basic Metabolic Panel: Recent Labs  Lab 03/06/20 0628 03/08/20 0705 03/09/20 0516 03/10/20 0512 03/11/20 0526  NA 136 139 141 143 143  K 3.1* 4.3 4.0 4.4 4.0  CL 97* 101 104 107 108  CO2 '25 26 25 24 23  '$ GLUCOSE 120* 152* 128* 129* 75  BUN 53* 70* 72* 74* 70*  CREATININE 3.37* 3.11* 2.90* 2.58* 2.36*  CALCIUM 7.9* 8.3* 8.7* 8.8* 8.7*  MG  --  1.7 1.8  --   --     Liver Function Tests: Recent Labs  Lab 03/05/20 1447 03/06/20 0628  AST 37 26  ALT 22 12  ALKPHOS 111 103  BILITOT 1.4* 1.2  PROT 6.3* 5.4*  ALBUMIN 3.2* 2.7*   No results for input(s): LIPASE, AMYLASE in the last 168 hours. No results for input(s): AMMONIA in the last 168 hours.  CBC: Recent Labs  Lab 03/05/20 1447 03/05/20 2221 03/06/20 0628 03/08/20 0705 03/09/20 0516 03/10/20 0512  03/11/20 0526  WBC 24.3*   < > 17.1* 16.5* 12.9* 15.5* 19.2*  19.4*  NEUTROABS 20.7*  --   --  14.8*  --   --  PENDING  HGB 12.2   < > 10.8* 9.8* 10.2* 10.9* 10.8*  10.7*  HCT 37.3   < > 32.5* 30.4* 31.2* 32.3* 34.3*  33.6*  MCV 88.8   < > 88.8 89.7 88.9 88.3 91.0  90.3  PLT 148*   < > 124* 139* 156 182 214  207   < > = values in this interval not displayed.    Cardiac Enzymes: No results for input(s): CKTOTAL, CKMB, CKMBINDEX, TROPONINI in the last 168 hours.  BNP: Invalid input(s): POCBNP  CBG: Recent Labs  Lab 03/07/20 0758 03/08/20 0802 03/09/20 0817 03/10/20 0811 03/11/20 0750  GLUCAP 148* 137* 115* 104* 81    Microbiology: Results for orders placed or performed during the hospital encounter of 03/05/20  SARS Coronavirus 2 by RT PCR (hospital order, performed in [facility] hospital lab) Nasopharyngeal Nasopharyngeal Swab     Status: None   Collection Time: 03/05/20  6:15 PM   Specimen: Nasopharyngeal Swab  Result Value Ref [place] Status   SARS Coronavirus 2 NEGATIVE NEGATIVE Final    Comment: (NOTE) SARS-CoV-2 target nucleic acids are NOT DETECTED.  The SARS-CoV-2  RNA is generally detectable in upper and lower respiratory specimens during the acute phase of infection. The lowest concentration of SARS-CoV-2 viral copies this assay can detect is 250 copies / mL. A negative result does not preclude SARS-CoV-2 infection and should not be used as the sole basis for treatment or other patient management decisions.  A negative result may occur with improper specimen collection / handling, submission of specimen other than nasopharyngeal swab, presence of viral mutation(s) within the areas targeted by this assay, and inadequate number of viral copies (<250 copies / mL). A negative result must be combined with clinical observations, patient history, and epidemiological information.  Fact Sheet for Patients:   StrictlyIdeas.no  Fact Sheet  for Healthcare Providers: BankingDealers.co.za  This test is not yet approved or  cleared by the [place] FDA and has been authorized for detection and/or diagnosis of SARS-CoV-2 by FDA under an Emergency Use Authorization (EUA).  This EUA will remain in effect (meaning this test can be used) for the duration of the COVID-19 declaration under Section 564(b)(1) of the Act, 21 U.S.C. section 360bbb-3(b)(1), unless the authorization is terminated or revoked sooner.  Performed at [facility], [address], [place], [place] [address]   Blood Culture (routine x 2)     Status: Abnormal   Collection Time: 03/05/20  6:15 PM   Specimen: BLOOD  Result Value Ref [place] Status   Specimen Description   Final    BLOOD LEFT ASSIST CONTROL Performed at [facility], [address] [place], [place] [address]    Special Requests   Final    BOTTLES DRAWN AEROBIC AND ANAEROBIC Blood Culture results may not be optimal due to an inadequate volume of blood received in culture bottles Performed at [facility], [address], [place], [place] [address]    Culture  Setup Time   Final    GRAM NEGATIVE RODS IN BOTH AEROBIC AND ANAEROBIC BOTTLES CRITICAL RESULT CALLED TO, READ BACK BY AND VERIFIED WITHLloyd Huger AT [id number] 03/06/20 SDR Performed at [facility], [place] [address] [place], [place] [address]    Culture ESCHERICHIA COLI (A)  Final   Report Status 03/08/2020 FINAL  Final   Organism ID, Bacteria ESCHERICHIA COLI  Final      Susceptibility   Escherichia coli - MIC*    AMPICILLIN >=32 RESISTANT Resistant     CEFAZOLIN <=4 SENSITIVE Sensitive     CEFEPIME <=0.12 SENSITIVE Sensitive     CEFTAZIDIME <=1 SENSITIVE Sensitive     CEFTRIAXONE <=0.25 SENSITIVE Sensitive     CIPROFLOXACIN <=0.25 SENSITIVE Sensitive     GENTAMICIN <=1 SENSITIVE Sensitive     IMIPENEM <=0.25 SENSITIVE Sensitive     TRIMETH/SULFA <=20 SENSITIVE Sensitive      AMPICILLIN/SULBACTAM >=32 RESISTANT Resistant     PIP/TAZO <=4 SENSITIVE Sensitive     * ESCHERICHIA COLI  Blood Culture (routine x 2)     Status: Abnormal   Collection Time: 03/05/20  6:15 PM   Specimen: BLOOD  Result Value [place] Status   Specimen Description   Final    BLOOD RIGHT ASSIST CONTROL Performed at [facility], [address], [place], [place] [address]    Special Requests   Final    BOTTLES DRAWN AEROBIC AND ANAEROBIC Blood Culture results may not be optimal due to an inadequate volume of blood received in culture bottles Performed at [facility], [address], [place], [place] [address]  Culture  Setup Time   Final    GRAM NEGATIVE RODS IN BOTH AEROBIC AND ANAEROBIC BOTTLES CRITICAL VALUE NOTED.  VALUE IS CONSISTENT WITH PREVIOUSLY REPORTED AND CALLED VALUE. Performed at [facility], [address] [place], [place] [address]    Culture (A)  Final    ESCHERICHIA COLI SUSCEPTIBILITIES PERFORMED ON PREVIOUS CULTURE WITHIN THE LAST 5 DAYS. Performed at [facility], [place] [address] [place], [place] [address]    Report Status 03/08/2020 FINAL  Final  Blood Culture ID Panel (Reflexed)     Status: Abnormal   Collection Time: 03/05/20  6:15 PM  Result Value Ref [place] Status   Enterococcus faecalis NOT DETECTED NOT DETECTED Final   Enterococcus Faecium NOT DETECTED NOT DETECTED Final   Listeria monocytogenes NOT DETECTED NOT DETECTED Final   Staphylococcus species NOT DETECTED NOT DETECTED Final   Staphylococcus aureus (BCID) NOT DETECTED NOT DETECTED Final   Staphylococcus epidermidis NOT DETECTED NOT DETECTED Final   Staphylococcus lugdunensis NOT DETECTED NOT DETECTED Final   Streptococcus species NOT DETECTED NOT DETECTED Final   Streptococcus agalactiae NOT DETECTED NOT DETECTED Final   Streptococcus pneumoniae NOT DETECTED NOT DETECTED Final   Streptococcus pyogenes NOT DETECTED NOT DETECTED Final    A.calcoaceticus-baumannii NOT DETECTED NOT DETECTED Final   Bacteroides fragilis NOT DETECTED NOT DETECTED Final   Enterobacterales DETECTED (A) NOT DETECTED Final    Comment: Enterobacterales represent a large order of gram negative bacteria, not a single organism. CRITICAL RESULT CALLED TO, READ BACK BY AND VERIFIED WITH:  NATHAN BELUE AT [id number] 03/06/20 SDR    Enterobacter cloacae complex NOT DETECTED NOT DETECTED Final   Escherichia coli DETECTED (A) NOT DETECTED Final    Comment: CRITICAL RESULT CALLED TO, READ BACK BY AND VERIFIED WITH:  NATHAN BELUE AT [id number] 03/06/20 SDR    Klebsiella aerogenes NOT DETECTED NOT DETECTED Final   Klebsiella oxytoca NOT DETECTED NOT DETECTED Final   Klebsiella pneumoniae NOT DETECTED NOT DETECTED Final   Proteus species NOT DETECTED NOT DETECTED Final   Salmonella species NOT DETECTED NOT DETECTED Final   Serratia marcescens NOT DETECTED NOT DETECTED Final   Haemophilus influenzae NOT DETECTED NOT DETECTED Final   Neisseria meningitidis NOT DETECTED NOT DETECTED Final   Pseudomonas aeruginosa NOT DETECTED NOT DETECTED Final   Stenotrophomonas maltophilia NOT DETECTED NOT DETECTED Final   Candida albicans NOT DETECTED NOT DETECTED Final   Candida auris NOT DETECTED NOT DETECTED Final   Candida glabrata NOT DETECTED NOT DETECTED Final   Candida krusei NOT DETECTED NOT DETECTED Final   Candida parapsilosis NOT DETECTED NOT DETECTED Final   Candida tropicalis NOT DETECTED NOT DETECTED Final   Cryptococcus neoformans/gattii NOT DETECTED NOT DETECTED Final   CTX-M ESBL NOT DETECTED NOT DETECTED Final   Carbapenem resistance IMP NOT DETECTED NOT DETECTED Final   Carbapenem resistance KPC NOT DETECTED NOT DETECTED Final   Carbapenem resistance NDM NOT DETECTED NOT DETECTED Final   Carbapenem resist OXA 48 LIKE NOT DETECTED NOT DETECTED Final   Carbapenem resistance VIM NOT DETECTED NOT DETECTED Final    Comment: Performed at [facility], [address] [place], [place] [address]  Culture, blood (single) w Reflex to ID Panel     Status: None (Preliminary result)   Collection Time: 03/10/20  3:44 PM   Specimen: BLOOD  Result Value Ref [place] Status   Specimen Description BLOOD BLOOD LEFT HAND  Final   Special Requests   Final  BOTTLES DRAWN AEROBIC AND ANAEROBIC Blood Culture adequate volume   Culture   Final    NO GROWTH < 24 HOURS Performed at [facility], [address] [place], [place] [address]    Report Status PENDING  Incomplete    Coagulation Studies: No results for input(s): LABPROT, INR in the last 72 hours.  Urinalysis: No results for input(s): COLORURINE, LABSPEC, PHURINE, GLUCOSEU, HGBUR, BILIRUBINUR, KETONESUR, PROTEINUR, UROBILINOGEN, NITRITE, LEUKOCYTESUR in the last 72 hours.  Invalid input(s): APPERANCEUR    Imaging: DG Chest Port 1 View  Result Date: 03/10/2020 CLINICAL DATA:  CHF, cough, COPD EXAM: PORTABLE CHEST 1 VIEW COMPARISON:  03/08/2020 chest radiograph. FINDINGS: Stable cardiomediastinal silhouette with top-normal heart size. No pneumothorax. Stable moderate elevation of the right hemidiaphragm. Chronic right basilar moderate atelectasis. No overt pulmonary edema. No acute consolidative airspace disease. IMPRESSION: Chronic moderate elevation of the right hemidiaphragm with right basilar atelectasis. No acute cardiopulmonary disease. Electronically Signed   By: Ilona Sorrel M.D.   On: 03/10/2020 14:10     Medications:   . azithromycin     . allopurinol  100 mg Oral Daily  . atorvastatin  20 mg Oral QPM  . budesonide (PULMICORT) nebulizer solution  0.5 mg Nebulization BID  . calcitRIOL  0.25 mcg Oral Daily  . cephALEXin  500 mg Oral Q12H  . dextromethorphan-guaiFENesin  1 tablet Oral BID  . diltiazem  180 mg Oral Daily  . fluticasone  2 spray Each Nare Daily  . heparin  5,000 Units Subcutaneous Q8H  . ipratropium-albuterol  3 mL Nebulization TID  . loratadine  10 mg Oral  Daily  . multivitamin-lutein  1 capsule Oral Daily  . pantoprazole  40 mg Oral Daily   acetaminophen **OR** acetaminophen, albuterol, guaiFENesin-dextromethorphan, ondansetron **OR** ondansetron (ZOFRAN) IV  Assessment/ Plan:  Gloria Day is a 87 y.o.  female with PMHX significant of COPD, CHF per, HTN, CKD stage 4, gout, and anemia of chronic disease. She presents to the ED with c/o nausea, vomiting, diarrhea, and dysuria for 2-3 days.   1.Acute kidney injury in the setting of chronic kidney disease stage 4 believed to be caused by recent diarrhea and dehydration Baseline creatinine of 1.8 in Nov 2021.  Creatinine elevated at 2.36, peaked at 3.37 on 03/07/19, IV fluids have been stopped.  2. Hypertension  - BP elevated - currently on diltiazem  2. UTI with sepsis Continue Antibiotics : currently on keflex oral  Primary team will continue to manage this   3. Gastroenteritis No diarrhea    LOS: 6 Sueo Cullen P Cashae Weich 2/13/202212:19 PM

## 2020-03-12 ENCOUNTER — Inpatient Hospital Stay: Payer: Medicare HMO

## 2020-03-12 DIAGNOSIS — R7881 Bacteremia: Secondary | ICD-10-CM

## 2020-03-12 DIAGNOSIS — I10 Essential (primary) hypertension: Secondary | ICD-10-CM | POA: Diagnosis not present

## 2020-03-12 DIAGNOSIS — A4151 Sepsis due to Escherichia coli [E. coli]: Secondary | ICD-10-CM | POA: Diagnosis not present

## 2020-03-12 DIAGNOSIS — B9620 Unspecified Escherichia coli [E. coli] as the cause of diseases classified elsewhere: Secondary | ICD-10-CM | POA: Diagnosis not present

## 2020-03-12 DIAGNOSIS — N3001 Acute cystitis with hematuria: Secondary | ICD-10-CM | POA: Diagnosis not present

## 2020-03-12 DIAGNOSIS — J029 Acute pharyngitis, unspecified: Secondary | ICD-10-CM | POA: Diagnosis not present

## 2020-03-12 DIAGNOSIS — N390 Urinary tract infection, site not specified: Secondary | ICD-10-CM

## 2020-03-12 DIAGNOSIS — N179 Acute kidney failure, unspecified: Secondary | ICD-10-CM | POA: Diagnosis not present

## 2020-03-12 LAB — BASIC METABOLIC PANEL
Anion gap: 12 (ref 5–15)
BUN: 61 mg/dL — ABNORMAL HIGH (ref 8–23)
CO2: 22 mmol/L (ref 22–32)
Calcium: 8.7 mg/dL — ABNORMAL LOW (ref 8.9–10.3)
Chloride: 108 mmol/L (ref 98–111)
Creatinine, Ser: 2.09 mg/dL — ABNORMAL HIGH (ref 0.44–1.00)
GFR, Estimated: 22 mL/min — ABNORMAL LOW (ref >60)
Glucose, Bld: 111 mg/dL — ABNORMAL HIGH (ref 70–99)
Potassium: 4.4 mmol/L (ref 3.5–5.1)
Sodium: 142 mmol/L (ref 135–145)

## 2020-03-12 LAB — CBC WITH DIFFERENTIAL/PLATELET
Abs Immature Granulocytes: 2.79 10*3/uL — ABNORMAL HIGH (ref 0.00–0.07)
Basophils Absolute: 0.1 10*3/uL (ref 0.0–0.1)
Basophils Relative: 1 %
Eosinophils Absolute: 0.6 10*3/uL — ABNORMAL HIGH (ref 0.0–0.5)
Eosinophils Relative: 3 %
HCT: 33.6 % — ABNORMAL LOW (ref 36.0–46.0)
Hemoglobin: 10.9 g/dL — ABNORMAL LOW (ref 12.0–15.0)
Immature Granulocytes: 14 %
Lymphocytes Relative: 6 %
Lymphs Abs: 1.3 10*3/uL (ref 0.7–4.0)
MCH: 28.8 pg (ref 26.0–34.0)
MCHC: 32.4 g/dL (ref 30.0–36.0)
MCV: 88.9 fL (ref 80.0–100.0)
Monocytes Absolute: 0.7 10*3/uL (ref 0.1–1.0)
Monocytes Relative: 4 %
Neutro Abs: 14.9 10*3/uL — ABNORMAL HIGH (ref 1.7–7.7)
Neutrophils Relative %: 72 %
Platelets: 231 10*3/uL (ref 150–400)
RBC: 3.78 MIL/uL — ABNORMAL LOW (ref 3.87–5.11)
RDW: 13.9 % (ref 11.5–15.5)
Smear Review: NORMAL
WBC: 20.4 10*3/uL — ABNORMAL HIGH (ref 4.0–10.5)
nRBC: 0.1 % (ref 0.0–0.2)

## 2020-03-12 LAB — GLUCOSE, CAPILLARY: Glucose-Capillary: 104 mg/dL — ABNORMAL HIGH (ref 70–99)

## 2020-03-12 LAB — PROCALCITONIN: Procalcitonin: 3.42 ng/mL

## 2020-03-12 LAB — GROUP A STREP BY PCR: Group A Strep by PCR: NOT DETECTED

## 2020-03-12 LAB — LACTIC ACID, PLASMA: Lactic Acid, Venous: 0.6 mmol/L (ref 0.5–1.9)

## 2020-03-12 MED ORDER — PHENOL 1.4 % MT LIQD
1.0000 | OROMUCOSAL | Status: DC | PRN
  Filled 2020-03-12: qty 177

## 2020-03-12 MED ORDER — HYDRALAZINE HCL 25 MG PO TABS: 25.0000 mg | ORAL_TABLET | Freq: Three times a day (TID) | ORAL | Status: DC | PRN

## 2020-03-12 MED ORDER — BLISTEX MEDICATED EX OINT
TOPICAL_OINTMENT | CUTANEOUS | Status: DC | PRN
  Administered 2020-03-13: 1 via TOPICAL
  Filled 2020-03-12: qty 6.3

## 2020-03-12 MED ORDER — IOHEXOL 9 MG/ML PO SOLN
500.0000 mL | ORAL | Status: AC
  Administered 2020-03-12 (×2): 500 mL via ORAL

## 2020-03-12 MED ORDER — MENTHOL 3 MG MT LOZG
1.0000 | LOZENGE | OROMUCOSAL | Status: DC | PRN
  Administered 2020-03-12: 3 mg via ORAL
  Filled 2020-03-12 (×2): qty 9

## 2020-03-12 MED ORDER — PIPERACILLIN-TAZOBACTAM 3.375 G IVPB
3.3750 g | Freq: Three times a day (TID) | INTRAVENOUS | Status: DC
  Administered 2020-03-12 – 2020-03-14 (×7): 3.375 g via INTRAVENOUS
  Filled 2020-03-12 (×7): qty 50

## 2020-03-12 NOTE — Consult Note (Signed)
NAME: Gloria Day  DOB: 1928-06-23  MRN: [id number]  Date/Time: 03/12/2020 1:10 PM  REQUESTING PROVIDER: Dr. Arbutus Ped Subjective:  REASON FOR CONSULT: Leukocytosis ?history from chart -patient a poor historian Gloria Day is a 87 y.o. female with a history of CHF, COPD, hypertension, renal insufficiency presented to the ED on 03/05/2020 from urgent care with concerns of renal failure and UTI.  According to the patient she has had vomiting and diarrhea for a few days.  She also had dysuria whenever she was urinating.  She denied any black or bloody stool.  She did not have any fever.  She has some baseline cough On 03/05/20 T max 102.6, HR 107, RR 26, BP 124/77 Wbc 24.3, b 12.2, PLT 148 Cr 3.21 ( baseline 1.6). CT abdomen revealed No urinary tract calculi or obstructive uropathy. 2. Diverticulosis of the descending and sigmoid colon. Wall thickening of the mid sigmoid colon likely reflects postinflammatory Scarring. No signss of active inflammation on this Exam. Blood culture and urine culture sent and she was started on ceftriaxone as blood culture positive for e.coli and urine culture positive fr e.coli.  As the wbc improved she was put on PO antibiotics But since then WBC has started to increase and it is 20 and I asked to see her Pt has sore throat and also left sided abdominal pain   Past Medical History:  Diagnosis Date  . CHF (congestive heart failure) ([place])   . COPD (chronic obstructive pulmonary disease) ([place])   . Hypertension   . Renal insufficiency     Past Surgical History:  Procedure Laterality Date  . ABDOMINAL HYSTERECTOMY    . CHOLECYSTECTOMY    . TONSILLECTOMY    . TOTAL KNEE ARTHROPLASTY      Social History   Socioeconomic History  . Marital status: Widowed    Spouse name: Not on file  . Number of children: Not on file  . Years of education: Not on file  . Highest education level: Not on file  Occupational History  . Not on file  Tobacco Use  . Smoking  status: Never Smoker  . Smokeless tobacco: Never Used  Vaping Use  . Vaping Use: Never used  Substance and Sexual Activity  . Alcohol use: Never  . Drug use: Never  . Sexual activity: Not on file  Other Topics Concern  . Not on file  Social History Narrative  . Not on file   Social Determinants of Health   Financial Resource Strain: Not on file  Food Insecurity: Not on file  Transportation Needs: Not on file  Physical Activity: Not on file  Stress: Not on file  Social Connections: Not on file  Intimate Partner Violence: Not on file    Family History  Problem Relation Age of Onset  . Heart attack Mother   . Heart attack Father   . Diabetes Sister   . Skin cancer Brother   . Lung cancer Brother   . Colon cancer Brother   . Bone cancer Brother   . Parkinson's disease Brother   . Heart attack Brother    No Known Allergies  ? Current Facility-Administered Medications  Medication Dose Route Frequency Provider Last Rate Last Admin  . 0.9 %  sodium chloride infusion   Intravenous PRN Nicole Kindred A, DO 10 mL/hr at 03/11/20 1235 1,000 mL at 03/11/20 1235  . acetaminophen (TYLENOL) tablet 650 mg  650 mg Oral Q6H PRN Clance Boll, MD   650 mg  at 03/07/20 2211   Or  . acetaminophen (TYLENOL) suppository 650 mg  650 mg Rectal Q6H PRN Myles Rosenthal A, MD      . albuterol (PROVENTIL) (2.5 MG/3ML) 0.083% nebulizer solution 2.5 mg  2.5 mg Nebulization Q2H PRN Myles Rosenthal A, MD   2.5 mg at 03/06/20 1733  . allopurinol (ZYLOPRIM) tablet 100 mg  100 mg Oral Daily Loletha Grayer, MD   100 mg at 03/12/20 0845  . atorvastatin (LIPITOR) tablet 20 mg  20 mg Oral QPM Hallaji, Sheema M, RPH   20 mg at 03/11/20 1801  . azithromycin (ZITHROMAX) 500 mg in sodium chloride 0.9 % 250 mL IVPB  500 mg Intravenous Q24H Nicole Kindred A, DO 250 mL/hr at 03/12/20 0848 500 mg at 03/12/20 0848  . budesonide (PULMICORT) nebulizer solution 0.5 mg  0.5 mg Nebulization BID Loletha Grayer, MD   0.5 mg at 03/12/20 0730  . calcitRIOL (ROCALTROL) capsule 0.25 mcg  0.25 mcg Oral Daily Myles Rosenthal A, MD   0.25 mcg at 03/12/20 0844  . cephALEXin (KEFLEX) capsule 500 mg  500 mg Oral Q12H Nicole Kindred A, DO   500 mg at 03/12/20 0845  . dextromethorphan-guaiFENesin (MUCINEX DM) 30-600 MG per 12 hr tablet 1 tablet  1 tablet Oral BID Nicole Kindred A, DO   1 tablet at 03/12/20 0844  . diltiazem (CARDIZEM CD) 24 hr capsule 240 mg  240 mg Oral Daily Nicole Kindred A, DO   240 mg at 03/12/20 0844  . fluticasone (FLONASE) 50 MCG/ACT nasal spray 2 spray  2 spray Each Nare Daily Nicole Kindred A, DO   2 spray at 03/12/20 0842  . guaiFENesin-dextromethorphan (ROBITUSSIN DM) 100-10 MG/5ML syrup 5 mL  5 mL Oral Q4H PRN Nicole Kindred A, DO      . heparin injection 5,000 Units  5,000 Units Subcutaneous Q8H Clance Boll, MD   5,000 Units at 03/12/20 0523  . ipratropium-albuterol (DUONEB) 0.5-2.5 (3) MG/3ML nebulizer solution 3 mL  3 mL Nebulization TID Loletha Grayer, MD   3 mL at 03/12/20 0730  . lip balm (BLISTEX) ointment   Topical PRN Ezekiel Slocumb, DO   Given at 03/12/20 1119  . loratadine (CLARITIN) tablet 10 mg  10 mg Oral Daily Nicole Kindred A, DO   10 mg at 03/12/20 0845  . menthol-cetylpyridinium (CEPACOL) lozenge 3 mg  1 lozenge Oral PRN Nicole Kindred A, DO   3 mg at 03/12/20 1120  . multivitamin-lutein (OCUVITE-LUTEIN) capsule 1 capsule  1 capsule Oral Daily Loletha Grayer, MD   1 capsule at 03/12/20 0843  . ondansetron (ZOFRAN) tablet 4 mg  4 mg Oral Q6H PRN Clance Boll, MD       Or  . ondansetron [facility]) injection 4 mg  4 mg Intravenous Q6H PRN Myles Rosenthal A, MD      . pantoprazole (PROTONIX) EC tablet 40 mg  40 mg Oral Daily Loletha Grayer, MD   40 mg at 03/12/20 0844  . phenol (CHLORASEPTIC) mouth spray 1 spray  1 spray Mouth/Throat PRN Nicole Kindred A, DO         Abtx:  Anti-infectives (From admission, onward)   Start      Dose/Rate Route Frequency Ordered Stop   03/11/20 1000  azithromycin (ZITHROMAX) 500 mg in sodium chloride 0.9 % 250 mL IVPB        500 mg 250 mL/hr over 60 Minutes Intravenous Every 24 hours 03/11/20 0904     03/09/20 2200  cephALEXin (KEFLEX) capsule 500 mg        500 mg Oral Every 12 hours 03/09/20 1253 03/16/20 0959   03/09/20 0800  ceFAZolin (ANCEF) IVPB 1 g/50 mL premix  Status:  Discontinued        1 g 100 mL/hr over 30 Minutes Intravenous Every 12 hours 03/08/20 0932 03/09/20 1253   03/06/20 1000  cefTRIAXone (ROCEPHIN) 2 g in sodium chloride 0.9 % 100 mL IVPB        2 g 200 mL/hr over 30 Minutes Intravenous Every 24 hours 03/06/20 0745 03/08/20 0936   03/05/20 1730  cefTRIAXone (ROCEPHIN) 1 g in sodium chloride 0.9 % 100 mL IVPB  Status:  Discontinued        1 g 200 mL/hr over 30 Minutes Intravenous Every 24 hours 03/05/20 1716 03/06/20 0745      REVIEW OF SYSTEMS: pt is not a reliable historian Const: no  fever, negative chills, negative weight loss Eyes: negative diplopia or visual changes, negative eye pain ENT: negative coryza, has  sore throat Resp: has cough, no hemoptysis, dyspnea Cards: negative for chest pain, palpitations, lower extremity edema GU: negative for frequency, has dysuria , no hematuria GI: left sided abdominal pain Skin: negative for rash and pruritus Heme: negative for easy bruising and gum/nose bleeding MS: generalized weakness Neurolo:memory problems  Psych: negative for feelings of anxiety, depression  Endocrine: negative for thyroid, diabetes Allergy/Immunology- negative for any medication or food allergies ?  Objective:  VITALS:  BP (!) 144/60 (BP Location: Right Arm)   Pulse 96   Temp 97.7 F (36.5 C)   Resp 17   Ht '5\' 4"'$  (1.626 m)   Wt 95.3 kg   SpO2 95%   BMI 36.05 kg/m  PHYSICAL EXAM:  General: Alert, cooperative, no distress, appears stated age. Intermittently confused Head: Normocephalic, without obvious abnormality,  atraumatic. Eyes: Conjunctivae clear, anicteric sclerae. Pupils are equal ENT Nares normal. No drainage or sinus tenderness. Pharynx erythematous Lips, mucosa, and tongue normal. No Thrush Poor dentition Neck: Supple, symmetrical, no adenopathy, thyroid: non tender no carotid bruit and no JVD. Back: No CVA tenderness. Lungs: b/l air entry Heart: Regular rate and rhythm, no murmur, rub or gallop. Abdomen: Soft, some tenderness left side of abdomen Extremities: atraumatic, no cyanosis. No edema. No clubbing Skin: No rashes or lesions. Or bruising Lymph: Cervical, supraclavicular normal. Neurologic: Grossly non-focal Pertinent Labs Lab Results CBC      Component Value Date/Time   WBC 20.4 (H) 03/12/2020 0421   RBC 3.78 (L) 03/12/2020 0421   HGB 10.9 (L) 03/12/2020 0421   HGB 11.1 (L) 04/30/2013 0437   HCT 33.6 (L) 03/12/2020 0421   HCT 32.0 (L) 04/30/2013 0437   PLT 231 03/12/2020 0421   PLT 147 (L) 05/04/2013 0457   MCV 88.9 03/12/2020 0421   MCV 86 04/30/2013 0437   MCH 28.8 03/12/2020 0421   MCHC 32.4 03/12/2020 0421   RDW 13.9 03/12/2020 0421   RDW 13.7 04/30/2013 0437   LYMPHSABS 1.3 03/12/2020 0421   LYMPHSABS 1.7 04/30/2013 0437   MONOABS 0.7 03/12/2020 0421   MONOABS 0.5 04/30/2013 0437   EOSABS 0.6 (H) 03/12/2020 0421   EOSABS 0.2 04/30/2013 0437   BASOSABS 0.1 03/12/2020 0421   BASOSABS 0.1 04/30/2013 0437    CMP Latest Ref Rng & Units 03/12/2020 03/11/2020 03/10/2020  Glucose 70 - 99 mg/dL 111(H) 75 129(H)  BUN 8 - 23 mg/dL 61(H) 70(H) 74(H)  Creatinine 0.44 - 1.00 mg/dL 2.09(H)  2.36(H) 2.58(H)  Sodium 135 - 145 mmol/L 142 143 143  Potassium 3.5 - 5.1 mmol/L 4.4 4.0 4.4  Chloride 98 - 111 mmol/L 108 108 107  CO2 22 - 32 mmol/L '22 23 24  '$ Calcium 8.9 - 10.3 mg/dL 8.7(L) 8.7(L) 8.8(L)  Total Protein 6.5 - 8.1 g/dL - - -  Total Bilirubin 0.3 - 1.2 mg/dL - - -  Alkaline Phos 38 - 126 U/L - - -  AST 15 - 41 U/L - - -  ALT 0 - 44 U/L - - -       Microbiology: Recent Results (from the past 240 hour(s))  Urine culture     Status: Abnormal   Collection Time: 03/05/20  2:09 PM   Specimen: Urine, Clean Catch  Result Value Ref Range Status   Specimen Description   Final    URINE, CLEAN CATCH Performed at [facility], [address] [place], [place] [address]    Special Requests   Final    NONE Performed at [facility], [place] [address], [place], [place] [address]    Culture >=100,000 COLONIES/mL ESCHERICHIA COLI (A)  Final   Report Status 03/08/2020 FINAL  Final   Organism ID, Bacteria ESCHERICHIA COLI (A)  Final      Susceptibility   Escherichia coli - MIC*    AMPICILLIN >=32 RESISTANT Resistant     CEFAZOLIN <=4 SENSITIVE Sensitive     CEFEPIME <=0.12 SENSITIVE Sensitive     CEFTRIAXONE <=0.25 SENSITIVE Sensitive     CIPROFLOXACIN <=0.25 SENSITIVE Sensitive     GENTAMICIN <=1 SENSITIVE Sensitive     IMIPENEM <=0.25 SENSITIVE Sensitive     NITROFURANTOIN <=16 SENSITIVE Sensitive     TRIMETH/SULFA <=20 SENSITIVE Sensitive     AMPICILLIN/SULBACTAM >=32 RESISTANT Resistant     PIP/TAZO <=4 SENSITIVE Sensitive     * >=100,000 COLONIES/mL ESCHERICHIA COLI  SARS Coronavirus 2 by RT PCR (hospital order, performed in [facility] hospital lab) Nasopharyngeal Nasopharyngeal Swab     Status: None   Collection Time: 03/05/20  6:15 PM   Specimen: Nasopharyngeal Swab  Result Value Ref Range Status   SARS Coronavirus 2 NEGATIVE NEGATIVE Final    Comment: (NOTE) SARS-CoV-2 target nucleic acids are NOT DETECTED.  The SARS-CoV-2 RNA is generally detectable in upper and lower respiratory specimens during the acute phase of infection. The lowest concentration of SARS-CoV-2 viral copies this assay can detect is 250 copies / mL. A negative result does not preclude SARS-CoV-2 infection and should not be used as the sole basis for treatment or other patient management decisions.  A negative result may occur  with improper specimen collection / handling, submission of specimen other than nasopharyngeal swab, presence of viral mutation(s) within the areas targeted by this assay, and inadequate number of viral copies (<250 copies / mL). A negative result must be combined with clinical observations, patient history, and epidemiological information.  Fact Sheet for Patients:   StrictlyIdeas.no  Fact Sheet for Healthcare Providers: BankingDealers.co.za  This test is not yet approved or  cleared by the [place] FDA and has been authorized for detection and/or diagnosis of SARS-CoV-2 by FDA under an Emergency Use Authorization (EUA).  This EUA will remain in effect (meaning this test can be used) for the duration of the COVID-19 declaration under Section 564(b)(1) of the Act, 21 U.S.C. section 360bbb-3(b)(1), unless the authorization is terminated or revoked sooner.  Performed at [facility], [address] [place],  [place] [address]   Blood Culture (routine x 2)     Status: Abnormal   Collection Time: 03/05/20  6:15 PM   Specimen: BLOOD  Result Value Ref Range Status   Specimen Description   Final    BLOOD LEFT ASSIST CONTROL Performed at [facility], [address] [place], [place] [address]    Special Requests   Final    BOTTLES DRAWN AEROBIC AND ANAEROBIC Blood Culture results may not be optimal due to an inadequate volume of blood received in culture bottles Performed at [facility], [address] [place], [place] [address]    Culture  Setup Time   Final    GRAM NEGATIVE RODS IN BOTH AEROBIC AND ANAEROBIC BOTTLES CRITICAL RESULT CALLED TO, READ BACK BY AND VERIFIED WITHLloyd Huger AT [id number] 03/06/20 SDR Performed at [facility], [place] [address], [place], [place] [address]    Culture ESCHERICHIA COLI (A)  Final   Report Status 03/08/2020 FINAL  Final   Organism ID, Bacteria ESCHERICHIA  COLI  Final      Susceptibility   Escherichia coli - MIC*    AMPICILLIN >=32 RESISTANT Resistant     CEFAZOLIN <=4 SENSITIVE Sensitive     CEFEPIME <=0.12 SENSITIVE Sensitive     CEFTAZIDIME <=1 SENSITIVE Sensitive     CEFTRIAXONE <=0.25 SENSITIVE Sensitive     CIPROFLOXACIN <=0.25 SENSITIVE Sensitive     GENTAMICIN <=1 SENSITIVE Sensitive     IMIPENEM <=0.25 SENSITIVE Sensitive     TRIMETH/SULFA <=20 SENSITIVE Sensitive     AMPICILLIN/SULBACTAM >=32 RESISTANT Resistant     PIP/TAZO <=4 SENSITIVE Sensitive     * ESCHERICHIA COLI  Blood Culture (routine x 2)     Status: Abnormal   Collection Time: 03/05/20  6:15 PM   Specimen: BLOOD  Result Value Ref Range Status   Specimen Description   Final    BLOOD RIGHT ASSIST CONTROL Performed at [facility], [address], [place], [place] [address]    Special Requests   Final    BOTTLES DRAWN AEROBIC AND ANAEROBIC Blood Culture results may not be optimal due to an inadequate volume of blood received in culture bottles Performed at [facility], [address] [place], [place] [address]    Culture  Setup Time   Final    GRAM NEGATIVE RODS IN BOTH AEROBIC AND ANAEROBIC BOTTLES CRITICAL VALUE NOTED.  VALUE IS CONSISTENT WITH PREVIOUSLY REPORTED AND CALLED VALUE. Performed at [facility], [address] [place], [place] [address]    Culture (A)  Final    ESCHERICHIA COLI SUSCEPTIBILITIES PERFORMED ON PREVIOUS CULTURE WITHIN THE LAST 5 DAYS. Performed at [facility], [place] [address] [place], [place] [address]    Report Status 03/08/2020 FINAL  Final  Blood Culture ID Panel (Reflexed)     Status: Abnormal   Collection Time: 03/05/20  6:15 PM  Result Value Ref Range Status   Enterococcus faecalis NOT DETECTED NOT DETECTED Final   Enterococcus Faecium NOT DETECTED NOT DETECTED Final   Listeria monocytogenes NOT DETECTED NOT DETECTED Final   Staphylococcus species NOT DETECTED NOT DETECTED Final    Staphylococcus aureus (BCID) NOT DETECTED NOT DETECTED Final   Staphylococcus epidermidis NOT DETECTED NOT DETECTED Final   Staphylococcus lugdunensis NOT DETECTED NOT DETECTED Final   Streptococcus species NOT DETECTED NOT DETECTED Final   Streptococcus agalactiae NOT DETECTED NOT DETECTED Final   Streptococcus pneumoniae NOT DETECTED NOT DETECTED Final  Streptococcus pyogenes NOT DETECTED NOT DETECTED Final   A.calcoaceticus-baumannii NOT DETECTED NOT DETECTED Final   Bacteroides fragilis NOT DETECTED NOT DETECTED Final   Enterobacterales DETECTED (A) NOT DETECTED Final    Comment: Enterobacterales represent a large order of gram negative bacteria, not a single organism. CRITICAL RESULT CALLED TO, READ BACK BY AND VERIFIED WITH:  NATHAN BELUE AT [id number] 03/06/20 SDR    Enterobacter cloacae complex NOT DETECTED NOT DETECTED Final   Escherichia coli DETECTED (A) NOT DETECTED Final    Comment: CRITICAL RESULT CALLED TO, READ BACK BY AND VERIFIED WITH:  NATHAN BELUE AT [id number] 03/06/20 SDR    Klebsiella aerogenes NOT DETECTED NOT DETECTED Final   Klebsiella oxytoca NOT DETECTED NOT DETECTED Final   Klebsiella pneumoniae NOT DETECTED NOT DETECTED Final   Proteus species NOT DETECTED NOT DETECTED Final   Salmonella species NOT DETECTED NOT DETECTED Final   Serratia marcescens NOT DETECTED NOT DETECTED Final   Haemophilus influenzae NOT DETECTED NOT DETECTED Final   Neisseria meningitidis NOT DETECTED NOT DETECTED Final   Pseudomonas aeruginosa NOT DETECTED NOT DETECTED Final   Stenotrophomonas maltophilia NOT DETECTED NOT DETECTED Final   Candida albicans NOT DETECTED NOT DETECTED Final   Candida auris NOT DETECTED NOT DETECTED Final   Candida glabrata NOT DETECTED NOT DETECTED Final   Candida krusei NOT DETECTED NOT DETECTED Final   Candida parapsilosis NOT DETECTED NOT DETECTED Final   Candida tropicalis NOT DETECTED NOT DETECTED Final   Cryptococcus neoformans/gattii NOT DETECTED NOT  DETECTED Final   CTX-M ESBL NOT DETECTED NOT DETECTED Final   Carbapenem resistance IMP NOT DETECTED NOT DETECTED Final   Carbapenem resistance KPC NOT DETECTED NOT DETECTED Final   Carbapenem resistance NDM NOT DETECTED NOT DETECTED Final   Carbapenem resist OXA 48 LIKE NOT DETECTED NOT DETECTED Final   Carbapenem resistance VIM NOT DETECTED NOT DETECTED Final    Comment: Performed at [facility], [address] [place], [place] [address]  Culture, blood (single) w Reflex to ID Panel     Status: None (Preliminary result)   Collection Time: 03/10/20  3:44 PM   Specimen: BLOOD  Result Value Ref Range Status   Specimen Description BLOOD BLOOD LEFT HAND  Final   Special Requests   Final    BOTTLES DRAWN AEROBIC AND ANAEROBIC Blood Culture adequate volume   Culture   Final    NO GROWTH 2 DAYS Performed at [facility], [address] [place], [place] [address]    Report Status PENDING  Incomplete  SARS CORONAVIRUS 2 (TAT 6-24 HRS) Nasopharyngeal Nasopharyngeal Swab     Status: None   Collection Time: 03/11/20 11:28 AM   Specimen: Nasopharyngeal Swab  Result Value Ref Range Status   SARS Coronavirus 2 NEGATIVE NEGATIVE Final    Comment: (NOTE) SARS-CoV-2 target nucleic acids are NOT DETECTED.  The SARS-CoV-2 RNA is generally detectable in upper and lower respiratory specimens during the acute phase of infection. Negative results do not preclude SARS-CoV-2 infection, do not rule out co-infections with other pathogens, and should not be used as the sole basis for treatment or other patient management decisions. Negative results must be combined with clinical observations, patient history, and epidemiological information. The expected result is Negative.  Fact Sheet for Patients: SugarRoll.be  Fact Sheet for Healthcare Providers: https://www.woods-mathews.com/  This test is not yet approved or cleared by the [place] FDA and  has been authorized for detection and/or diagnosis of SARS-CoV-2 by FDA under  an Emergency Use Authorization (EUA). This EUA will remain  in effect (meaning this test can be used) for the duration of the COVID-19 declaration under Se ction 564(b)(1) of the Act, 21 U.S.C. section 360bbb-3(b)(1), unless the authorization is terminated or revoked sooner.  Performed at [facility], [place] [address], [place], [place] [address]     IMAGING RESULTS: CT abdomen and pelvis done on 03/05/2020 without contrast shows no urinary tract calculi or obstructive uropathy.  Diverticulosis of the descending and sigmoid colon.  Wall thickening of the mid sigmoid colon likely reflects postinflammatory scarring.  Known pancreatic cyst. I have personally reviewed the films ? Impression/Recommendation ?E. coli bacteremia E. coli UTI Was on ceftriaxone and then switched to p.o. cephalexin once wbc declined from 24 >>12  Worsening leukocytosis again- now 20.4  Rule out sigmoid diverticulitis and complication from that.  Patient had a CT abdomen without contrast that showed some diverticulitis sequalea  Recommend to repeat CT Will change Keflex to Zosyn. ? Pharyngitis- [facility] A strep as it would have been treated with ceftriaxone and then Keflex_________________________________________ Discussed with patient, requesting provider Note:  This document was prepared using Dragon voice recognition software and may include unintentional dictation errors.

## 2020-03-12 NOTE — Progress Notes (Addendum)
PROGRESS NOTE    Gloria Day   [id number]  DOB: 1928-03-05  PCP: Sofie Hartigan, MD    DOA: 03/05/2020 LOS: 7   Brief Narrative   87 y.o. female with medical history of diastolic CHF, COPD not on home O2 , HTN, CKD stage IV, gout, anemia of chronic disease, who presented to the ED from urgent care with complaints of worsening nausea, vomiting, diarrhea and dysuria x 2-3 days. Urgent care referred patient to the ED due to low BP on 90/60 and UA showing infection and labs showing worsened renal function and leukocytosis.  Patient also reported dyspnea worse than baseline, but no hypoxia was noted.      Significant Events: - Admitted 03/05/2020  Assessment & Plan   Active Problems:   UTI (urinary tract infection)   Sepsis due to Escherichia coli with acute renal failure without septic shock (HCC)   Acute diastolic CHF (congestive heart failure) (HCC)   Acute kidney injury superimposed on CKD ([place])   Hypokalemia   Essential hypertension   Hyperlipidemia   Severe sepsis due to E. coli bacteremia and UTI -presented with severe sepsis as evidenced by leukocytosis, tachycardia, fever in the setting of UTI.  AKI superimposed on CKD stage IV reflects organ dysfunction consistent with severe sepsis.  Sepsis physiology improved. Abx: Rocephin >> Ancef >> Keflex --Continue Keflex to complete 7 days given uncomplicated GNR bacteremia --Follow repeat cultures - neg to date  Leukocytosis due to suspected colitis/diverticulitis -WBC rising, up to 20k today.   2/11-12 pt didn't feel well, cough worse, generally more fatigued.  Chest xray's stable.   --Retested Covid: negative --Follow up respiratory panel --Had previously added Zithromax for ?bronchitis --Follow up pending blood culture - neg to date --Monitor CBC --Infectious disease consulted, has started Zosyn for likely colitis/diverticulitis --Repeat CT abdomen/pelvis with PO contrast only (due to renal failure)  Cough -  suspect UACS (upper airway cough syndrome) due to allergic rhinitis and post-nasal drip +/- ?volume overload.  Repeat CXR stable.  Cough seems improved with trial of Mucinex, loratadine and Flonase.  On Zithromax as above in case of bronchitis  Acute on chronic diastolic CHF -IV fluids previously held.  Patient appears euvolemic on exam.   Echo - EF 70-75% hyperdynamic LVEF systolic function, grade 1 diastolic dysfunction. --Monitor volume status closely, on fluids for AKI as below  Acute respiratory failure with hypoxia - Resolved.  Pt not on supplemental oxygen at baseline, needed 2 L/min along with subjective shortness of breath.  Weaned off. Pt has elevated R hemidiaphragm, and not mobilizing therefore atelectasis contributes to hypoxia.  No sign of PNA on CXR.  Doubt PE without tachycardia or hemodynamic changes; dyspnea also is stable. Repeat chest xrays have been stable without signs PNA  COPD with mild acute exacerbation - had mild wheezing 2/8, treated with IV steroid, exacerbation resolved.  Per chart review, appears pt used to be prescribed inhalers but stopped them on her own as she did not feel they were helpful.  Pt has been prescribed inhalers in past, did not find them very helpful.   --PRN albuterol  --off steroids and has been stable --Duonebs q6h, Pulmicort nebs BID  AKI superimposed on CKD stage IV -patient received some IV fluids on arrival.  Was subsequently placed on oxygen so fluids were stopped.  Nephrology is consulted.  Monitor BMP.  Hold Lasix. Off IV fluids, monitor.  Hypokalemia -potassium was replaced.  Monitor and replace as needed.  Diarrhea -present  on admission, unclear etiology.  Stool studies were ordered and pending. Patient denies further episodes of diarrhea since admission.  Monitor.  Generalized weakness-evaluated by physical therapy and home health PT with 24-hour supervision was recommended.  Pt lives with daughter, will have 24/7 care. --Continue  PT   Essential hypertension - BP's have been elevated this week. Antihypertensive medications initially held due to soft BP.  BP's elevated today.   Resumed on Cardizem, dose increased to 240 mg daily.   Hold Lasix.  Monitor BP and resume when indicated. Oral hydralazine PRN.  Nausea vomiting -reported on admission, improved.  Likely secondary to above.  Antiemetics as needed.  Constipation - stool softeners, PRN laxatives  Hyperlipidemia -continue Zocor  Obesity: Body mass index is 36.05 kg/m.  Complicates overall care and prognosis.  Recommend physical activity and diet efforts towards weight loss.  Primary care follow-up.  DVT prophylaxis: heparin injection 5,000 Units Start: 03/05/20 2215   Diet:  Diet Orders (From admission, onward)    Start     Ordered   03/06/20 1149  Diet Heart Room service appropriate? Yes; Fluid consistency: Thin  Diet effective now       Question Answer Comment  Room service appropriate? Yes   Fluid consistency: Thin      03/06/20 1149            Code Status: DNR    Subjective 03/12/20    Patient up in chair when seen today.  She reports she continues to cough, has light yellow phlegm production.  No fevers or chills.  She states is difficult to get phlegm to come up today again.  She also reports irritated sore throat.  Says her abdomen feels okay but has occasional abdominal discomfort.  She had been constipated and was straining on the bedside commode when I saw her yesterday.  She states that resolved and she "had the squirts" after that.   Disposition Plan & Communication   Status is: Inpatient  Remains inpatient appropriate because: Worsening leukocytosis with likely colitis/diverticulitis and placed back on IV antibiotic.   Dispo: The patient is from: Home              Anticipated d/c is to: Home              Anticipated d/c date is: ~2 days              Patient currently is not medically stable to d/c.   Difficult to place  patient No  Family communication -spoke with daughter by phone this morning, 2/14.  Updated on rising white count and evaluation underway   Consults, Procedures, Significant Events   Consultants:   Nephrology  Infectious disease  Procedures:   none  Antimicrobials:  Anti-infectives (From admission, onward)   Start     Dose/Rate Route Frequency Ordered Stop   03/12/20 1430  piperacillin-tazobactam (ZOSYN) IVPB 3.375 g        3.375 g 12.5 mL/hr over 240 Minutes Intravenous Every 8 hours 03/12/20 1337     03/11/20 1000  azithromycin (ZITHROMAX) 500 mg in sodium chloride 0.9 % 250 mL IVPB        500 mg 250 mL/hr over 60 Minutes Intravenous Every 24 hours 03/11/20 0904     03/09/20 2200  cephALEXin (KEFLEX) capsule 500 mg  Status:  Discontinued        500 mg Oral Every 12 hours 03/09/20 1253 03/12/20 1318   03/09/20 0800  ceFAZolin (ANCEF) IVPB 1  g/50 mL premix  Status:  Discontinued        1 g 100 mL/hr over 30 Minutes Intravenous Every 12 hours 03/08/20 0932 03/09/20 1253   03/06/20 1000  cefTRIAXone (ROCEPHIN) 2 g in sodium chloride 0.9 % 100 mL IVPB        2 g 200 mL/hr over 30 Minutes Intravenous Every 24 hours 03/06/20 0745 03/08/20 0936   03/05/20 1730  cefTRIAXone (ROCEPHIN) 1 g in sodium chloride 0.9 % 100 mL IVPB  Status:  Discontinued        1 g 200 mL/hr over 30 Minutes Intravenous Every 24 hours 03/05/20 1716 03/06/20 0745        Objective   Vitals:   03/12/20 0730 03/12/20 0745 03/12/20 1136 03/12/20 1601  BP:  (!) 156/78 (!) 144/60 (!) 155/66  Pulse: 100 (!) 105 96 93  Resp: '20 19 17 18  '$ Temp:  98.8 F (37.1 C) 97.7 F (36.5 C) 98.3 F (36.8 C)  TempSrc:      SpO2: 93% 95% 95% 97%  Weight:      Height:        Intake/Output Summary (Last 24 hours) at 03/12/2020 1628 Last data filed at 03/12/2020 1342 Gross per 24 hour  Intake 360 ml  Output 600 ml  Net -240 ml   Filed Weights   03/05/20 1649  Weight: 95.3 kg    Physical Exam:  General  exam: awake and alert, up in chair, no acute distress, obese Respiratory system: CTAB, no wheezes, dry hacking cough, on room air, normal respiratory effort. Cardiovascular system: normal S1/S2, RRR, no pedal edema.  Gastrointestinal system: mildly tender on palpation, +bowel sounds Central nervous system: normal speech, CN's grossly intact, grossly nonfocal exam Extremities: moves all, no cyanosis   Labs   Data Reviewed: I have personally reviewed following labs and imaging studies  CBC: Recent Labs  Lab 03/08/20 0705 03/09/20 0516 03/10/20 0512 03/11/20 0526 03/12/20 0421  WBC 16.5* 12.9* 15.5* 19.2*  19.4* 20.4*  NEUTROABS 14.8*  --   --  13.5* 14.9*  HGB 9.8* 10.2* 10.9* 10.8*  10.7* 10.9*  HCT 30.4* 31.2* 32.3* 34.3*  33.6* 33.6*  MCV 89.7 88.9 88.3 91.0  90.3 88.9  PLT 139* 156 182 214  207 [id number]   Basic Metabolic Panel: Recent Labs  Lab 03/08/20 0705 03/09/20 0516 03/10/20 0512 03/11/20 0526 03/12/20 0421  NA 139 141 143 143 142  K 4.3 4.0 4.4 4.0 4.4  CL 101 104 107 108 108  CO2 '26 25 24 23 22  '$ GLUCOSE 152* 128* 129* 75 111*  BUN 70* 72* 74* 70* 61*  CREATININE 3.11* 2.90* 2.58* 2.36* 2.09*  CALCIUM 8.3* 8.7* 8.8* 8.7* 8.7*  MG 1.7 1.8  --   --   --    GFR: Estimated Creatinine Clearance: 20.4 mL/min (A) (by C-G formula based on SCr of 2.09 mg/dL (H)). Liver Function Tests: Recent Labs  Lab 03/06/20 0628  AST 26  ALT 12  ALKPHOS 103  BILITOT 1.2  PROT 5.4*  ALBUMIN 2.7*   No results for input(s): LIPASE, AMYLASE in the last 168 hours. No results for input(s): AMMONIA in the last 168 hours. Coagulation Profile: No results for input(s): INR, PROTIME in the last 168 hours. Cardiac Enzymes: No results for input(s): CKTOTAL, CKMB, CKMBINDEX, TROPONINI in the last 168 hours. BNP (last 3 results) No results for input(s): PROBNP in the last 8760 hours. HbA1C: No results for input(s): HGBA1C in the last  72 hours. CBG: Recent Labs  Lab  03/08/20 0802 03/09/20 0817 03/10/20 0811 03/11/20 0750 03/12/20 0746  GLUCAP 137* 115* 104* 81 104*   Lipid Profile: No results for input(s): CHOL, HDL, LDLCALC, TRIG, CHOLHDL, LDLDIRECT in the last 72 hours. Thyroid Function Tests: No results for input(s): TSH, T4TOTAL, FREET4, T3FREE, THYROIDAB in the last 72 hours. Anemia Panel: No results for input(s): VITAMINB12, FOLATE, FERRITIN, TIBC, IRON, RETICCTPCT in the last 72 hours. Sepsis Labs: Recent Labs  Lab 03/05/20 1815 03/10/20 1138 03/12/20 0421  PROCALCITON  --  11.32 3.42  LATICACIDVEN 1.6 2.4* 0.6    Recent Results (from the past 240 hour(s))  Urine culture     Status: Abnormal   Collection Time: 03/05/20  2:09 PM   Specimen: Urine, Clean Catch  Result Value Ref Range Status   Specimen Description   Final    URINE, CLEAN CATCH Performed at [facility], [address] [place], [place] [address]    Special Requests   Final    NONE Performed at [facility], [facility] [address] [place], [place] [address]    Culture >=100,000 COLONIES/mL ESCHERICHIA COLI (A)  Final   Report Status 03/08/2020 FINAL  Final   Organism ID, Bacteria ESCHERICHIA COLI (A)  Final      Susceptibility   Escherichia coli - MIC*    AMPICILLIN >=32 RESISTANT Resistant     CEFAZOLIN <=4 SENSITIVE Sensitive     CEFEPIME <=0.12 SENSITIVE Sensitive     CEFTRIAXONE <=0.25 SENSITIVE Sensitive     CIPROFLOXACIN <=0.25 SENSITIVE Sensitive     GENTAMICIN <=1 SENSITIVE Sensitive     IMIPENEM <=0.25 SENSITIVE Sensitive     NITROFURANTOIN <=16 SENSITIVE Sensitive     TRIMETH/SULFA <=20 SENSITIVE Sensitive     AMPICILLIN/SULBACTAM >=32 RESISTANT Resistant     PIP/TAZO <=4 SENSITIVE Sensitive     * >=100,000 COLONIES/mL ESCHERICHIA COLI  SARS Coronavirus 2 by RT PCR (hospital order, performed in [place] hospital lab) Nasopharyngeal Nasopharyngeal Swab     Status: None   Collection Time: 03/05/20  6:15 PM   Specimen:  Nasopharyngeal Swab  Result Value Ref Range Status   SARS Coronavirus 2 NEGATIVE NEGATIVE Final    Comment: (NOTE) SARS-CoV-2 target nucleic acids are NOT DETECTED.  The SARS-CoV-2 RNA is generally detectable in upper and lower respiratory specimens during the acute phase of infection. The lowest concentration of SARS-CoV-2 viral copies this assay can detect is 250 copies / mL. A negative result does not preclude SARS-CoV-2 infection and should not be used as the sole basis for treatment or other patient management decisions.  A negative result may occur with improper specimen collection / handling, submission of specimen other than nasopharyngeal swab, presence of viral mutation(s) within the areas targeted by this assay, and inadequate number of viral copies (<250 copies / mL). A negative result must be combined with clinical observations, patient history, and epidemiological information.  Fact Sheet for Patients:   StrictlyIdeas.no  Fact Sheet for Healthcare Providers: BankingDealers.co.za  This test is not yet approved or  cleared by the [place] FDA and has been authorized for detection and/or diagnosis of SARS-CoV-2 by FDA under an Emergency Use Authorization (EUA).  This EUA will remain in effect (meaning this test can be used) for the duration of the COVID-19 declaration under Section 564(b)(1) of the Act, 21 U.S.C. section 360bbb-3(b)(1), unless the authorization is terminated or revoked sooner.  Performed at [facility], [address] [place], [place] [address]   Blood Culture (routine x 2)     Status: Abnormal   Collection Time: 03/05/20  6:15 PM   Specimen: BLOOD  Result Value Ref Range Status   Specimen Description   Final    BLOOD LEFT ASSIST CONTROL Performed at [facility], [address] [place], [place] [address]    Special Requests   Final    BOTTLES DRAWN AEROBIC AND ANAEROBIC  Blood Culture results may not be optimal due to an inadequate volume of blood received in culture bottles Performed at [facility], [address], [place], [place] [address]    Culture  Setup Time   Final    GRAM NEGATIVE RODS IN BOTH AEROBIC AND ANAEROBIC BOTTLES CRITICAL RESULT CALLED TO, READ BACK BY AND VERIFIED WITHLloyd Huger AT [id number] 03/06/20 SDR Performed at [facility], [place] [address], [place], [place] [address]    Culture ESCHERICHIA COLI (A)  Final   Report Status 03/08/2020 FINAL  Final   Organism ID, Bacteria ESCHERICHIA COLI  Final      Susceptibility   Escherichia coli - MIC*    AMPICILLIN >=32 RESISTANT Resistant     CEFAZOLIN <=4 SENSITIVE Sensitive     CEFEPIME <=0.12 SENSITIVE Sensitive     CEFTAZIDIME <=1 SENSITIVE Sensitive     CEFTRIAXONE <=0.25 SENSITIVE Sensitive     CIPROFLOXACIN <=0.25 SENSITIVE Sensitive     GENTAMICIN <=1 SENSITIVE Sensitive     IMIPENEM <=0.25 SENSITIVE Sensitive     TRIMETH/SULFA <=20 SENSITIVE Sensitive     AMPICILLIN/SULBACTAM >=32 RESISTANT Resistant     PIP/TAZO <=4 SENSITIVE Sensitive     * ESCHERICHIA COLI  Blood Culture (routine x 2)     Status: Abnormal   Collection Time: 03/05/20  6:15 PM   Specimen: BLOOD  Result Value Ref Range Status   Specimen Description   Final    BLOOD RIGHT ASSIST CONTROL Performed at [facility], [address], [place], [place] [address]    Special Requests   Final    BOTTLES DRAWN AEROBIC AND ANAEROBIC Blood Culture results may not be optimal due to an inadequate volume of blood received in culture bottles Performed at [facility], [address] [place], [place] [address]    Culture  Setup Time   Final    GRAM NEGATIVE RODS IN BOTH AEROBIC AND ANAEROBIC BOTTLES CRITICAL VALUE NOTED.  VALUE IS CONSISTENT WITH PREVIOUSLY REPORTED AND CALLED VALUE. Performed at [facility], [place] [place], [place] [address]    Culture (A)  Final     ESCHERICHIA COLI SUSCEPTIBILITIES PERFORMED ON PREVIOUS CULTURE WITHIN THE LAST 5 DAYS. Performed at [facility], [place] [address] [place], [place] [address]    Report Status 03/08/2020 FINAL  Final  Blood Culture ID Panel (Reflexed)     Status: Abnormal   Collection Time: 03/05/20  6:15 PM  Result Value Ref Range Status   Enterococcus faecalis NOT DETECTED NOT DETECTED Final   Enterococcus Faecium NOT DETECTED NOT DETECTED Final   Listeria monocytogenes NOT DETECTED NOT DETECTED Final   Staphylococcus species NOT DETECTED NOT DETECTED Final   Staphylococcus aureus (BCID) NOT DETECTED NOT DETECTED Final   Staphylococcus epidermidis NOT DETECTED NOT DETECTED Final   Staphylococcus lugdunensis NOT DETECTED NOT DETECTED Final   Streptococcus species NOT DETECTED NOT DETECTED Final   Streptococcus agalactiae NOT DETECTED NOT DETECTED Final   Streptococcus pneumoniae NOT DETECTED  NOT DETECTED Final   Streptococcus pyogenes NOT DETECTED NOT DETECTED Final   A.calcoaceticus-baumannii NOT DETECTED NOT DETECTED Final   Bacteroides fragilis NOT DETECTED NOT DETECTED Final   Enterobacterales DETECTED (A) NOT DETECTED Final    Comment: Enterobacterales represent a large order of gram negative bacteria, not a single organism. CRITICAL RESULT CALLED TO, READ BACK BY AND VERIFIED WITH:  NATHAN BELUE AT [id number] 03/06/20 SDR    Enterobacter cloacae complex NOT DETECTED NOT DETECTED Final   Escherichia coli DETECTED (A) NOT DETECTED Final    Comment: CRITICAL RESULT CALLED TO, READ BACK BY AND VERIFIED WITH:  NATHAN BELUE AT [id number] 03/06/20 SDR    Klebsiella aerogenes NOT DETECTED NOT DETECTED Final   Klebsiella oxytoca NOT DETECTED NOT DETECTED Final   Klebsiella pneumoniae NOT DETECTED NOT DETECTED Final   Proteus species NOT DETECTED NOT DETECTED Final   Salmonella species NOT DETECTED NOT DETECTED Final   Serratia marcescens NOT DETECTED NOT DETECTED Final   Haemophilus influenzae NOT  DETECTED NOT DETECTED Final   Neisseria meningitidis NOT DETECTED NOT DETECTED Final   Pseudomonas aeruginosa NOT DETECTED NOT DETECTED Final   Stenotrophomonas maltophilia NOT DETECTED NOT DETECTED Final   Candida albicans NOT DETECTED NOT DETECTED Final   Candida auris NOT DETECTED NOT DETECTED Final   Candida glabrata NOT DETECTED NOT DETECTED Final   Candida krusei NOT DETECTED NOT DETECTED Final   Candida parapsilosis NOT DETECTED NOT DETECTED Final   Candida tropicalis NOT DETECTED NOT DETECTED Final   Cryptococcus neoformans/gattii NOT DETECTED NOT DETECTED Final   CTX-M ESBL NOT DETECTED NOT DETECTED Final   Carbapenem resistance IMP NOT DETECTED NOT DETECTED Final   Carbapenem resistance KPC NOT DETECTED NOT DETECTED Final   Carbapenem resistance NDM NOT DETECTED NOT DETECTED Final   Carbapenem resist OXA 48 LIKE NOT DETECTED NOT DETECTED Final   Carbapenem resistance VIM NOT DETECTED NOT DETECTED Final    Comment: Performed at [facility], [address] [place], [place] [address]  Culture, blood (single) w Reflex to ID Panel     Status: None (Preliminary result)   Collection Time: 03/10/20  3:44 PM   Specimen: BLOOD  Result Value Ref Range Status   Specimen Description BLOOD BLOOD LEFT HAND  Final   Special Requests   Final    BOTTLES DRAWN AEROBIC AND ANAEROBIC Blood Culture adequate volume   Culture   Final    NO GROWTH 2 DAYS Performed at [facility], [address] [place], [place] [address]    Report Status PENDING  Incomplete  SARS CORONAVIRUS 2 (TAT 6-24 HRS) Nasopharyngeal Nasopharyngeal Swab     Status: None   Collection Time: 03/11/20 11:28 AM   Specimen: Nasopharyngeal Swab  Result Value Ref Range Status   SARS Coronavirus 2 NEGATIVE NEGATIVE Final    Comment: (NOTE) SARS-CoV-2 target nucleic acids are NOT DETECTED.  The SARS-CoV-2 RNA is generally detectable in upper and lower respiratory specimens during the acute phase of  infection. Negative results do not preclude SARS-CoV-2 infection, do not rule out co-infections with other pathogens, and should not be used as the sole basis for treatment or other patient management decisions. Negative results must be combined with clinical observations, patient history, and epidemiological information. The expected result is Negative.  Fact Sheet for Patients: SugarRoll.be  Fact Sheet for Healthcare Providers: https://www.woods-mathews.com/  This test is not yet approved or cleared by the [place] FDA and  has been authorized for detection and/or diagnosis  of SARS-CoV-2 by FDA under an Emergency Use Authorization (EUA). This EUA will remain  in effect (meaning this test can be used) for the duration of the COVID-19 declaration under Se ction 564(b)(1) of the Act, 21 U.S.C. section 360bbb-3(b)(1), unless the authorization is terminated or revoked sooner.  Performed at [facility], [place] [address], [place], [place] [address]   Group A Strep by PCR     Status: None   Collection Time: 03/12/20  1:09 PM   Specimen: Throat; Sterile Swab  Result Value Ref Range Status   Group A Strep by PCR NOT DETECTED NOT DETECTED Final    Comment: Performed at [facility], [address], [place], [place] [address]      Imaging Studies   No results found.   Medications   Scheduled Meds: . allopurinol  100 mg Oral Daily  . atorvastatin  20 mg Oral QPM  . budesonide (PULMICORT) nebulizer solution  0.5 mg Nebulization BID  . calcitRIOL  0.25 mcg Oral Daily  . dextromethorphan-guaiFENesin  1 tablet Oral BID  . diltiazem  240 mg Oral Daily  . fluticasone  2 spray Each Nare Daily  . heparin  5,000 Units Subcutaneous Q8H  . ipratropium-albuterol  3 mL Nebulization TID  . loratadine  10 mg Oral Daily  . multivitamin-lutein  1 capsule Oral Daily  . pantoprazole  40 mg Oral Daily   Continuous Infusions: .  sodium chloride 1,000 mL (03/11/20 1235)  . azithromycin 500 mg (03/12/20 0848)  . piperacillin-tazobactam (ZOSYN)  IV 3.375 g (03/12/20 1554)       LOS: 7 days    Time spent: 30 minutes with greater than 50% spent at bedside and in coordination of care.    Ezekiel Slocumb, DO [facility]  03/12/2020, 4:28 PM      If 7PM-7AM, please contact night-coverage. How to contact the [facility] Attending or Consulting provider [place] or covering provider during after hours [place], for this patient?    1. Check the care team in [facility] and look for a) attending/consulting [facility] provider listed and b) the [facility] team listed 2. Log into [email or web address] and use [place]'s universal password to access. If you do not have the password, please contact the hospital operator. 3. Locate the [facility] provider you are looking for under [facility] and page to a number that you can be directly reached. 4. If you still have difficulty reaching the provider, please page the [facility] (Director on Call) for the Hospitalists listed on amion for assistance.

## 2020-03-12 NOTE — Consult Note (Signed)
Pharmacy Antibiotic Note  Gloria Day is a 87 y.o. female admitted on 03/05/2020 with intra-abdominal infection. Pt presented with dysuria/n/v/d x 2-3 days. PMH includes CHF, COPD, HTN, CKD, gout, and anemia. Pt has been treated for e. Coli bacteremia. Pt currently on azithromycin for concerns of bronchitis. Pt with worsening leukocytosis. Pharmacy has been consulted for Zosyn dosing.  WBC 12.9>15.5>20.4, afebrile  Plan: Zosyn 3.375g IV q8h (4 hour infusion).  Monitor renal function, borderline for adjustment  Height: '5\' 4"'$  (162.6 cm) Weight: 95.3 kg (210 lb) IBW/kg (Calculated) : 54.7  Temp (24hrs), Avg:98.1 F (36.7 C), Min:97.7 F (36.5 C), Max:98.8 F (37.1 C)  Recent Labs  Lab 03/05/20 1815 03/05/20 2221 03/08/20 0705 03/09/20 0516 03/10/20 0512 03/10/20 1138 03/11/20 0526 03/12/20 0421  WBC  --    < > 16.5* 12.9* 15.5*  --  19.2*  19.4* 20.4*  CREATININE  --    < > 3.11* 2.90* 2.58*  --  2.36* 2.09*  LATICACIDVEN 1.6  --   --   --   --  2.4*  --  0.6   < > = values in this interval not displayed.    Estimated Creatinine Clearance: 20.4 mL/min (A) (by C-G formula based on SCr of 2.09 mg/dL (H)).    No Known Allergies  Antimicrobials this admission: 2/13 azithromycin >> 2/11 cefazolin x 1 2/7 ceftriaxone >> 2/10 2/11 cephalexin >> 2/14 2/14 Zosyn >>  Microbiology results: 2/7 BCx: e. Coli 2/12 Bcx: NGTD 2/14 group A strep throat cx: pending 2/14 group A strep PCR: pending 2/14 resp. Panel: pending  Thank you for allowing pharmacy to be a part of this patient's care.  Benn Moulder, PharmD Pharmacy Resident  03/12/2020 1:40 PM

## 2020-03-12 NOTE — Care Management Important Message (Signed)
Important Message  Patient Details  Name: Gloria Day MRN: [id number] Date of Birth: Dec 12, 1928   Medicare Important Message Given:  Yes     Dannette Barbara 03/12/2020, 2:31 PM

## 2020-03-12 NOTE — Progress Notes (Signed)
[facility]  ROUNDING NOTE   Subjective:   Gloria Day was seen while sitting in the chair. She says she hasn't been able to eat Denies nausea or vomiting continues to be on room air. Complains of intermittent shortness of breath. Denies diarrhea.   Objective:  Vital signs in last 24 hours:  Temp:  [97.7 F (36.5 C)-98.8 F (37.1 C)] 97.7 F (36.5 C) (02/14 1136) Pulse Rate:  [95-108] 96 (02/14 1136) Resp:  [16-20] 17 (02/14 1136) BP: (139-172)/(60-83) 144/60 (02/14 1136) SpO2:  [92 %-96 %] 95 % (02/14 1136)  Weight change:  Filed Weights   03/05/20 1649  Weight: 95.3 kg    Intake/Output: I/O last 3 completed shifts: In: -  Out: 800 [Urine:800]   Intake/Output this shift:  Total I/O In: 120 [P.O.:120] Out: 600 [Urine:600]  Physical Exam: General: NAD, sitting up in chair.  Head: Normocephalic, atraumatic. Moist oral mucosal membranes  Eyes: Anicteric  Neck: Supple, trachea midline  Lungs:  Crackles in the bases  Heart: Regular rate and rhythm  Abdomen:  Soft, nontender,   Extremities:  minimal peripheral edema.  Neurologic: Alert and oriented   Skin: No lesions       Basic Metabolic Panel: Recent Labs  Lab 03/08/20 0705 03/09/20 0516 03/10/20 0512 03/11/20 0526 03/12/20 0421  NA 139 141 143 143 142  K 4.3 4.0 4.4 4.0 4.4  CL 101 104 107 108 108  CO2 '26 25 24 23 22  '$ GLUCOSE 152* 128* 129* 75 111*  BUN 70* 72* 74* 70* 61*  CREATININE 3.11* 2.90* 2.58* 2.36* 2.09*  CALCIUM 8.3* 8.7* 8.8* 8.7* 8.7*  MG 1.7 1.8  --   --   --     Liver Function Tests: Recent Labs  Lab 03/05/20 1447 03/06/20 0628  AST 37 26  ALT 22 12  ALKPHOS 111 103  BILITOT 1.4* 1.2  PROT 6.3* 5.4*  ALBUMIN 3.2* 2.7*   No results for input(s): LIPASE, AMYLASE in the last 168 hours. No results for input(s): AMMONIA in the last 168 hours.  CBC: Recent Labs  Lab 03/05/20 1447 03/05/20 2221 03/08/20 0705 03/09/20 0516 03/10/20 0512 03/11/20 0526  03/12/20 0421  WBC 24.3*   < > 16.5* 12.9* 15.5* 19.2*  19.4* 20.4*  NEUTROABS 20.7*  --  14.8*  --   --  13.5* 14.9*  HGB 12.2   < > 9.8* 10.2* 10.9* 10.8*  10.7* 10.9*  HCT 37.3   < > 30.4* 31.2* 32.3* 34.3*  33.6* 33.6*  MCV 88.8   < > 89.7 88.9 88.3 91.0  90.3 88.9  PLT 148*   < > 139* 156 182 214  207 231   < > = values in this interval not displayed.    Cardiac Enzymes: No results for input(s): CKTOTAL, CKMB, CKMBINDEX, TROPONINI in the last 168 hours.  BNP: Invalid input(s): POCBNP  CBG: Recent Labs  Lab 03/08/20 0802 03/09/20 0817 03/10/20 0811 03/11/20 0750 03/12/20 0746  GLUCAP 137* 115* 104* 46 104*    Microbiology: Results for orders placed or performed during the hospital encounter of 03/05/20  SARS Coronavirus 2 by RT PCR (hospital order, performed in [facility] hospital lab) Nasopharyngeal Nasopharyngeal Swab     Status: None   Collection Time: 03/05/20  6:15 PM   Specimen: Nasopharyngeal Swab  Result Value Ref Range Status   SARS Coronavirus 2 NEGATIVE NEGATIVE Final    Comment: (NOTE) SARS-CoV-2 target nucleic acids are NOT DETECTED.  The SARS-CoV-2  RNA is generally detectable in upper and lower respiratory specimens during the acute phase of infection. The lowest concentration of SARS-CoV-2 viral copies this assay can detect is 250 copies / mL. A negative result does not preclude SARS-CoV-2 infection and should not be used as the sole basis for treatment or other patient management decisions.  A negative result may occur with improper specimen collection / handling, submission of specimen other than nasopharyngeal swab, presence of viral mutation(s) within the areas targeted by this assay, and inadequate number of viral copies (<250 copies / mL). A negative result must be combined with clinical observations, patient history, and epidemiological information.  Fact Sheet for Patients:   StrictlyIdeas.no  Fact Sheet  for Healthcare Providers: BankingDealers.co.za  This test is not yet approved or  cleared by the [place] FDA and has been authorized for detection and/or diagnosis of SARS-CoV-2 by FDA under an Emergency Use Authorization (EUA).  This EUA will remain in effect (meaning this test can be used) for the duration of the COVID-19 declaration under Section 564(b)(1) of the Act, 21 U.S.C. section 360bbb-3(b)(1), unless the authorization is terminated or revoked sooner.  Performed at [facility], [address] [place], [place] [address]   Blood Culture (routine x 2)     Status: Abnormal   Collection Time: 03/05/20  6:15 PM   Specimen: BLOOD  Result Value Ref Range Status   Specimen Description   Final    BLOOD LEFT ASSIST CONTROL Performed at [facility], [address] [place], [place] [address]    Special Requests   Final    BOTTLES DRAWN AEROBIC AND ANAEROBIC Blood Culture results may not be optimal due to an inadequate volume of blood received in culture bottles Performed at [facility], [address], [place], [place] [address]    Culture  Setup Time   Final    GRAM NEGATIVE RODS IN BOTH AEROBIC AND ANAEROBIC BOTTLES CRITICAL RESULT CALLED TO, READ BACK BY AND VERIFIED WITHLloyd Huger AT [id number] 03/06/20 SDR Performed at [facility], [place] [address] [place], [place] [address]    Culture ESCHERICHIA COLI (A)  Final   Report Status 03/08/2020 FINAL  Final   Organism ID, Bacteria ESCHERICHIA COLI  Final      Susceptibility   Escherichia coli - MIC*    AMPICILLIN >=32 RESISTANT Resistant     CEFAZOLIN <=4 SENSITIVE Sensitive     CEFEPIME <=0.12 SENSITIVE Sensitive     CEFTAZIDIME <=1 SENSITIVE Sensitive     CEFTRIAXONE <=0.25 SENSITIVE Sensitive     CIPROFLOXACIN <=0.25 SENSITIVE Sensitive     GENTAMICIN <=1 SENSITIVE Sensitive     IMIPENEM <=0.25 SENSITIVE Sensitive     TRIMETH/SULFA <=20 SENSITIVE Sensitive      AMPICILLIN/SULBACTAM >=32 RESISTANT Resistant     PIP/TAZO <=4 SENSITIVE Sensitive     * ESCHERICHIA COLI  Blood Culture (routine x 2)     Status: Abnormal   Collection Time: 03/05/20  6:15 PM   Specimen: BLOOD  Result Value Ref Range Status   Specimen Description   Final    BLOOD RIGHT ASSIST CONTROL Performed at [facility], [address], [place], [place] [address]    Special Requests   Final    BOTTLES DRAWN AEROBIC AND ANAEROBIC Blood Culture results may not be optimal due to an inadequate volume of blood received in culture bottles Performed at [facility], [address], [place], [place] [address]  Culture  Setup Time   Final    GRAM NEGATIVE RODS IN BOTH AEROBIC AND ANAEROBIC BOTTLES CRITICAL VALUE NOTED.  VALUE IS CONSISTENT WITH PREVIOUSLY REPORTED AND CALLED VALUE. Performed at [facility], [address] [place], [place] [address]    Culture (A)  Final    ESCHERICHIA COLI SUSCEPTIBILITIES PERFORMED ON PREVIOUS CULTURE WITHIN THE LAST 5 DAYS. Performed at [facility], [place] [address] [place], [place] [address]    Report Status 03/08/2020 FINAL  Final  Blood Culture ID Panel (Reflexed)     Status: Abnormal   Collection Time: 03/05/20  6:15 PM  Result Value Ref Range Status   Enterococcus faecalis NOT DETECTED NOT DETECTED Final   Enterococcus Faecium NOT DETECTED NOT DETECTED Final   Listeria monocytogenes NOT DETECTED NOT DETECTED Final   Staphylococcus species NOT DETECTED NOT DETECTED Final   Staphylococcus aureus (BCID) NOT DETECTED NOT DETECTED Final   Staphylococcus epidermidis NOT DETECTED NOT DETECTED Final   Staphylococcus lugdunensis NOT DETECTED NOT DETECTED Final   Streptococcus species NOT DETECTED NOT DETECTED Final   Streptococcus agalactiae NOT DETECTED NOT DETECTED Final   Streptococcus pneumoniae NOT DETECTED NOT DETECTED Final   Streptococcus pyogenes NOT DETECTED NOT DETECTED Final    A.calcoaceticus-baumannii NOT DETECTED NOT DETECTED Final   Bacteroides fragilis NOT DETECTED NOT DETECTED Final   Enterobacterales DETECTED (A) NOT DETECTED Final    Comment: Enterobacterales represent a large order of gram negative bacteria, not a single organism. CRITICAL RESULT CALLED TO, READ BACK BY AND VERIFIED WITH:  NATHAN BELUE AT [id number] 03/06/20 SDR    Enterobacter cloacae complex NOT DETECTED NOT DETECTED Final   Escherichia coli DETECTED (A) NOT DETECTED Final    Comment: CRITICAL RESULT CALLED TO, READ BACK BY AND VERIFIED WITH:  NATHAN BELUE AT [id number] 03/06/20 SDR    Klebsiella aerogenes NOT DETECTED NOT DETECTED Final   Klebsiella oxytoca NOT DETECTED NOT DETECTED Final   Klebsiella pneumoniae NOT DETECTED NOT DETECTED Final   Proteus species NOT DETECTED NOT DETECTED Final   Salmonella species NOT DETECTED NOT DETECTED Final   Serratia marcescens NOT DETECTED NOT DETECTED Final   Haemophilus influenzae NOT DETECTED NOT DETECTED Final   Neisseria meningitidis NOT DETECTED NOT DETECTED Final   Pseudomonas aeruginosa NOT DETECTED NOT DETECTED Final   Stenotrophomonas maltophilia NOT DETECTED NOT DETECTED Final   Candida albicans NOT DETECTED NOT DETECTED Final   Candida auris NOT DETECTED NOT DETECTED Final   Candida glabrata NOT DETECTED NOT DETECTED Final   Candida krusei NOT DETECTED NOT DETECTED Final   Candida parapsilosis NOT DETECTED NOT DETECTED Final   Candida tropicalis NOT DETECTED NOT DETECTED Final   Cryptococcus neoformans/gattii NOT DETECTED NOT DETECTED Final   CTX-M ESBL NOT DETECTED NOT DETECTED Final   Carbapenem resistance IMP NOT DETECTED NOT DETECTED Final   Carbapenem resistance KPC NOT DETECTED NOT DETECTED Final   Carbapenem resistance NDM NOT DETECTED NOT DETECTED Final   Carbapenem resist OXA 48 LIKE NOT DETECTED NOT DETECTED Final   Carbapenem resistance VIM NOT DETECTED NOT DETECTED Final    Comment: Performed at [facility], [address] [place], [place] [address]  Culture, blood (single) w Reflex to ID Panel     Status: None (Preliminary result)   Collection Time: 03/10/20  3:44 PM   Specimen: BLOOD  Result Value Ref Range Status   Specimen Description BLOOD BLOOD LEFT HAND  Final   Special Requests   Final  BOTTLES DRAWN AEROBIC AND ANAEROBIC Blood Culture adequate volume   Culture   Final    NO GROWTH 2 DAYS Performed at [facility], [address] [place], [place] [address]    Report Status PENDING  Incomplete  SARS CORONAVIRUS 2 (TAT 6-24 HRS) Nasopharyngeal Nasopharyngeal Swab     Status: None   Collection Time: 03/11/20 11:28 AM   Specimen: Nasopharyngeal Swab  Result Value Ref Range Status   SARS Coronavirus 2 NEGATIVE NEGATIVE Final    Comment: (NOTE) SARS-CoV-2 target nucleic acids are NOT DETECTED.  The SARS-CoV-2 RNA is generally detectable in upper and lower respiratory specimens during the acute phase of infection. Negative results do not preclude SARS-CoV-2 infection, do not rule out co-infections with other pathogens, and should not be used as the sole basis for treatment or other patient management decisions. Negative results must be combined with clinical observations, patient history, and epidemiological information. The expected result is Negative.  Fact Sheet for Patients: SugarRoll.be  Fact Sheet for Healthcare Providers: https://www.woods-mathews.com/  This test is not yet approved or cleared by the [place] FDA and  has been authorized for detection and/or diagnosis of SARS-CoV-2 by FDA under an Emergency Use Authorization (EUA). This EUA will remain  in effect (meaning this test can be used) for the duration of the COVID-19 declaration under Se ction 564(b)(1) of the Act, 21 U.S.C. section 360bbb-3(b)(1), unless the authorization is terminated or revoked sooner.  Performed at [facility], [place] [address], [place], [place] [address]     Coagulation Studies: No results for input(s): LABPROT, INR in the last 72 hours.  Urinalysis: No results for input(s): COLORURINE, LABSPEC, PHURINE, GLUCOSEU, HGBUR, BILIRUBINUR, KETONESUR, PROTEINUR, UROBILINOGEN, NITRITE, LEUKOCYTESUR in the last 72 hours.  Invalid input(s): APPERANCEUR    Imaging: No results found.   Medications:   . sodium chloride 1,000 mL (03/11/20 1235)  . azithromycin 500 mg (03/12/20 0848)   . allopurinol  100 mg Oral Daily  . atorvastatin  20 mg Oral QPM  . budesonide (PULMICORT) nebulizer solution  0.5 mg Nebulization BID  . calcitRIOL  0.25 mcg Oral Daily  . dextromethorphan-guaiFENesin  1 tablet Oral BID  . diltiazem  240 mg Oral Daily  . fluticasone  2 spray Each Nare Daily  . heparin  5,000 Units Subcutaneous Q8H  . ipratropium-albuterol  3 mL Nebulization TID  . loratadine  10 mg Oral Daily  . multivitamin-lutein  1 capsule Oral Daily  . pantoprazole  40 mg Oral Daily   sodium chloride, acetaminophen **OR** acetaminophen, albuterol, guaiFENesin-dextromethorphan, lip balm, menthol-cetylpyridinium, ondansetron **OR** ondansetron (ZOFRAN) IV, phenol  Assessment/ Plan:  Ms. Gloria Day is a 87 y.o.  female with PMHX significant of COPD, CHF per, HTN, CKD stage 4, gout, and anemia of chronic disease. She presents to the ED with c/o nausea, vomiting, diarrhea, and dysuria for 2-3 days.   1.Acute kidney injury in the setting of chronic kidney disease stage 4 Likely caused by dehydration prior to admission Baseline creatinine of 1.8 in Nov 2021.  Current Creatinine 2.09   2. Hypertension  - continued BP elevation -currently prescribed Cardizem  2. UTI with sepsis Completed Keflex treatment  Started Zosyn  Primary team will manage  3. Gastroenteritis Denies diarrhea, nausea and vomiting   LOS: 7 Shantelle Breeze,NP 2/14/20221:37 PM

## 2020-03-12 NOTE — Progress Notes (Signed)
Physical Therapy Treatment Patient Details Name: Gloria Day MRN: [id number] DOB: 1928-03-08 Today's Date: 03/12/2020    History of Present Illness Pt is admitted for UTI with complaints of dysuria and diarrhea x 3 days. History includes CHF, COPD, and HTN. Pt reports increased SOB and wheezing the past few days.    PT Comments    Pt alert, RN at bedside for med pass. Pt reported chronic knee pain but stated she was doing well today. Supine to sit with bed rails and supervision; pt with wheezing and reported feeling "swimmy headed" which decreased over time. SpO2 90% on room air, HR in 110s. spO2 increased to 93-94% with time and rest. minA to assist with water cup for medicines due to tremor/decreased vision. Sit <> stand with RW CGA, and pt transferred to recliner in room with supervision. Repositioned for comfort up in chair and further mobility declined by pt due to breakfast arrival. PT assisted with tray set up as needed. The patient would benefit from further skilled PT intervention to continue to progress towards goals. Recommendation remains appropriate.    Follow Up Recommendations  Home health PT;Supervision/Assistance - 24 hour     Equipment Recommendations  None recommended by PT    Recommendations for Other Services       Precautions / Restrictions Precautions Precautions: Fall Restrictions Weight Bearing Restrictions: No    Mobility  Bed Mobility Overal bed mobility: Needs Assistance Bed Mobility: Supine to Sit     Supine to sit: Supervision;HOB elevated Sit to supine: Supervision   General bed mobility comments: uses bed rails, extra time required, pt with wheezing this AM    Transfers Overall transfer level: Needs assistance Equipment used: Rolling walker (2 wheeled) Transfers: Sit to/from [facility] Sit to Stand: Min guard Stand pivot transfers: Supervision          Ambulation/Gait             General Gait Details:  deferred due to pt wanting to eat breakfast   Stairs             Wheelchair Mobility    Modified Rankin (Stroke Patients Only)       Balance Overall balance assessment: Needs assistance Sitting-balance support: Feet supported Sitting balance-Leahy Scale: Good     Standing balance support: During functional activity;Bilateral upper extremity supported Standing balance-Leahy Scale: Fair Standing balance comment: UE support on RW during gait                            Cognition Arousal/Alertness: Awake/alert Behavior During Therapy: WFL for tasks assessed/performed Overall Cognitive Status: Within Functional Limits for tasks assessed                                        Exercises Other Exercises Other Exercises: Pt able to sit EOB for several minutes for medication pass with RN at bedside. supervision for balance, minA for awter cup due to decreased vision/tremors Other Exercises: Pt assisted with breakfast tray; items explained and opened by PT due to poor vision, tremors    General Comments        Pertinent Vitals/Pain Pain Assessment: No/denies pain    Home Living                      Prior Function  PT Goals (current goals can now be found in the care plan section) Progress towards PT goals: Progressing toward goals    Frequency    Min 2X/week      PT Plan Current plan remains appropriate    Co-evaluation              AM-PAC PT "6 Clicks" Mobility   Outcome Measure  Help needed turning from your back to your side while in a flat bed without using bedrails?: None Help needed moving from lying on your back to sitting on the side of a flat bed without using bedrails?: A Little Help needed moving to and from a bed to a chair (including a wheelchair)?: A Little Help needed standing up from a chair using your arms (e.g., wheelchair or bedside chair)?: None Help needed to walk in hospital  room?: A Little Help needed climbing 3-5 steps with a railing? : A Lot 6 Click Score: 19    End of Session Equipment Utilized During Treatment: Gait belt Activity Tolerance: Patient tolerated treatment well Patient left: in chair;with call bell/phone within reach;with chair alarm set Nurse Communication: Mobility status PT Visit Diagnosis: Unsteadiness on feet (R26.81);Muscle weakness (generalized) (M62.81);Difficulty in walking, not elsewhere classified (R26.2)     Time: [id number] PT Time Calculation (min) (ACUTE ONLY): 18 min  Charges:  $Therapeutic Activity: 8-22 mins                     Lieutenant Diego PT, DPT 9:59 AM,03/12/20

## 2020-03-13 ENCOUNTER — Inpatient Hospital Stay: Payer: Self-pay

## 2020-03-13 DIAGNOSIS — N179 Acute kidney failure, unspecified: Secondary | ICD-10-CM | POA: Diagnosis not present

## 2020-03-13 DIAGNOSIS — A4151 Sepsis due to Escherichia coli [E. coli]: Secondary | ICD-10-CM | POA: Diagnosis not present

## 2020-03-13 DIAGNOSIS — I10 Essential (primary) hypertension: Secondary | ICD-10-CM | POA: Diagnosis not present

## 2020-03-13 DIAGNOSIS — R6520 Severe sepsis without septic shock: Secondary | ICD-10-CM | POA: Diagnosis not present

## 2020-03-13 LAB — RESPIRATORY PANEL BY PCR

## 2020-03-13 LAB — CBC WITH DIFFERENTIAL/PLATELET
Abs Immature Granulocytes: 1.94 10*3/uL — ABNORMAL HIGH (ref 0.00–0.07)
Basophils Absolute: 0.1 10*3/uL (ref 0.0–0.1)
Basophils Relative: 1 %
Eosinophils Absolute: 0.5 10*3/uL (ref 0.0–0.5)
Eosinophils Relative: 3 %
HCT: 31.8 % — ABNORMAL LOW (ref 36.0–46.0)
Hemoglobin: 10.7 g/dL — ABNORMAL LOW (ref 12.0–15.0)
Immature Granulocytes: 12 %
Lymphocytes Relative: 6 %
Lymphs Abs: 1 10*3/uL (ref 0.7–4.0)
MCH: 29.7 pg (ref 26.0–34.0)
MCHC: 33.6 g/dL (ref 30.0–36.0)
MCV: 88.3 fL (ref 80.0–100.0)
Monocytes Absolute: 0.5 10*3/uL (ref 0.1–1.0)
Monocytes Relative: 3 %
Neutro Abs: 11.8 10*3/uL — ABNORMAL HIGH (ref 1.7–7.7)
Neutrophils Relative %: 75 %
Platelets: 227 10*3/uL (ref 150–400)
RBC: 3.6 MIL/uL — ABNORMAL LOW (ref 3.87–5.11)
RDW: 13.9 % (ref 11.5–15.5)
Smear Review: NORMAL
WBC: 15.9 10*3/uL — ABNORMAL HIGH (ref 4.0–10.5)
nRBC: 0 % (ref 0.0–0.2)

## 2020-03-13 LAB — BASIC METABOLIC PANEL WITH GFR
Anion gap: 8 (ref 5–15)
BUN: 51 mg/dL — ABNORMAL HIGH (ref 8–23)
CO2: 23 mmol/L (ref 22–32)
Calcium: 8.5 mg/dL — ABNORMAL LOW (ref 8.9–10.3)
Chloride: 107 mmol/L (ref 98–111)
Creatinine, Ser: 1.94 mg/dL — ABNORMAL HIGH (ref 0.44–1.00)
GFR, Estimated: 24 mL/min — ABNORMAL LOW
Glucose, Bld: 110 mg/dL — ABNORMAL HIGH (ref 70–99)
Potassium: 4.4 mmol/L (ref 3.5–5.1)
Sodium: 138 mmol/L (ref 135–145)

## 2020-03-13 LAB — PROCALCITONIN: Procalcitonin: 1.65 ng/mL

## 2020-03-13 LAB — GLUCOSE, CAPILLARY: Glucose-Capillary: 104 mg/dL — ABNORMAL HIGH (ref 70–99)

## 2020-03-13 NOTE — Treatment Plan (Signed)
Diagnosis: diverticulitis    No Known Allergies  OPAT Orders Discharge antibiotics: Zosyn 9 grams in a continuous infusion every 24 hrs until 03/21/20  [facility] Care Per Protocol:  Labs weekly on Monday while on IV antibiotics: _X_ CBC with differential  _X_ CMP   _X_ Please pull PIC/midline  at completion of IV antibiotics   Fax weekly labs to [phone]   Call [phone] with any questions

## 2020-03-13 NOTE — TOC Progression Note (Addendum)
Transition of Care [facility]) - Progression Note    Patient Details  Name: Gloria Day MRN: [id number] Date of Birth: 02/17/1928  Transition of Care [facility]) CM/SW [place], LCSW Phone Number: 03/13/2020, 10:40 AM  Clinical Narrative:         Patient not medically stable for DC. PT still recommending Home Health at time of DC. [facility] to follow for PT, OT, RN, and Aide services.   2:10- Updated by Dr. Arbutus Ped that patient will likely DC with IV antibiotics tomorrow. Updated [place] with [facility] and [place] with [facility].  Expected Discharge Plan: [place] Barriers to Discharge: Continued Medical Work up  Expected Discharge Plan and Services Expected Discharge Plan: [place] arrangements for the past 2 months: [place]: PT,OT [facility] Agency: [facility] [place] Date [place]: 03/07/20   Representative spoke with at [place]: Jana Half   Social Determinants of Health ([place]) Interventions    Readmission Risk Interventions No flowsheet data found.

## 2020-03-13 NOTE — Progress Notes (Signed)
PHARMACY CONSULT NOTE FOR:  OUTPATIENT  PARENTERAL ANTIBIOTIC THERAPY (OPAT)  Indication: diverticulitis Regimen: Infuse piperacillin/tazobactam 9gm daily as continuous infusion  End date: 2/23/20022  IV antibiotic discharge orders are pended. To discharging provider:  please sign these orders via discharge navigator,  Select New Orders & click on the button choice - Manage This Unsigned Work.    Thank you for allowing pharmacy to be a part of this patient's care.  Doreene Eland, PharmD, BCPS.   Work Cell: [phone] 03/13/2020 3:28 PM

## 2020-03-13 NOTE — Progress Notes (Signed)
Dr Nicole Kindred okay to insert Midline.Home health aware that midline will be inserted since antibiotic will be  Until 2/23. Attempted x2 to contact daughter but no answer.

## 2020-03-13 NOTE — Progress Notes (Signed)
Physical Therapy Treatment Patient Details Name: Gloria Day MRN: [id number] DOB: 07-17-1928 Today's Date: 03/13/2020    History of Present Illness Pt is admitted for UTI with complaints of dysuria and diarrhea x 3 days. History includes CHF, COPD, and HTN. Pt reports increased SOB and wheezing the past few days. MD assessment includes: severe sepsis due to E. coli bacteremia and UTI, leukocytosis due to suspected colitis/diverticulitis, acute on chronic diastolic CHF, acute respiratory failure with hypoxia, AKI on CKD, hypokalemia, diarrhea, and weakness.    PT Comments    Pt was pleasant and motivated to participate during the session.  Pt found on room air with SpO2 >/= 92% and HR WNL throughout the session. Pt wheezed/dry coughed frequently during the session with nursing aware.   Pt required extra time with functional tasks and ambulated with a slow cadence but was steady with good control with no physical assistance needed during the session.  Pt was min to mod SOB after amb 25 feet but after a 1 min therapeutic rest break was able to amb 25 feet a second time.  Pt will benefit from HHPT services upon discharge to safely address deficits listed in patient problem list for decreased caregiver assistance and eventual return to PLOF.      Follow Up Recommendations  Home health PT;Supervision/Assistance - 24 hour     Equipment Recommendations  None recommended by PT    Recommendations for Other Services       Precautions / Restrictions Precautions Precautions: Fall Restrictions Weight Bearing Restrictions: No    Mobility  Bed Mobility Overal bed mobility: Modified Independent             General bed mobility comments: Use of bed rails and extra time required    Transfers Overall transfer level: Needs assistance Equipment used: Rolling walker (2 wheeled) Transfers: Sit to/from Stand Sit to Stand: Min guard;Supervision         General transfer comment: Min verbal  cues for hand placement  Ambulation/Gait Ambulation/Gait assistance: Min guard;Supervision Gait Distance (Feet): 25 Feet x 2 Assistive device: Rolling walker (2 wheeled) Gait Pattern/deviations: Decreased step length - left;Decreased step length - right;Step-through pattern Gait velocity: decreased   General Gait Details: Slow cadence but steady without LOB; SpO2 >/= 92% on room air both pre and post amb   Stairs             Wheelchair Mobility    Modified Rankin (Stroke Patients Only)       Balance Overall balance assessment: Needs assistance Sitting-balance support: Feet supported Sitting balance-Leahy Scale: Good     Standing balance support: During functional activity;Bilateral upper extremity supported Standing balance-Leahy Scale: Good Standing balance comment: Min lean on the RW during amb; steady during sharp turn                            Cognition Arousal/Alertness: Awake/alert Behavior During Therapy: WFL for tasks assessed/performed Overall Cognitive Status: Within Functional Limits for tasks assessed                                        Exercises Total Joint Exercises Ankle Circles/Pumps: Strengthening;Both;10 reps Quad Sets: Strengthening;Both;10 reps Gluteal Sets: Strengthening;Both;10 reps Heel Slides: Strengthening;Both;10 reps Hip ABduction/ADduction: Strengthening;Both;10 reps Long Arc Quad: Strengthening;Both;10 reps;15 reps Knee Flexion: Strengthening;Both;10 reps;15 reps    General Comments  Pertinent Vitals/Pain Pain Assessment: No/denies pain    Home Living                      Prior Function            PT Goals (current goals can now be found in the care plan section) Progress towards PT goals: Progressing toward goals    Frequency    Min 2X/week      PT Plan Current plan remains appropriate    Co-evaluation              AM-PAC PT "6 Clicks" Mobility    Outcome Measure  Help needed turning from your back to your side while in a flat bed without using bedrails?: None Help needed moving from lying on your back to sitting on the side of a flat bed without using bedrails?: A Little Help needed moving to and from a bed to a chair (including a wheelchair)?: A Little Help needed standing up from a chair using your arms (e.g., wheelchair or bedside chair)?: A Little Help needed to walk in hospital room?: A Little Help needed climbing 3-5 steps with a railing? : A Little 6 Click Score: 19    End of Session Equipment Utilized During Treatment: Gait belt Activity Tolerance: Patient tolerated treatment well Patient left: in chair;with call bell/phone within reach;with chair alarm set Nurse Communication: Mobility status PT Visit Diagnosis: Unsteadiness on feet (R26.81);Muscle weakness (generalized) (M62.81);Difficulty in walking, not elsewhere classified (R26.2)     Time: [id number] PT Time Calculation (min) (ACUTE ONLY): 25 min  Charges:  $Gait Training: 8-22 mins $Therapeutic Exercise: 8-22 mins                     D. Scott Loie Jahr PT, DPT 03/13/20, 2:15 PM

## 2020-03-13 NOTE — Progress Notes (Signed)
PROGRESS NOTE    Gloria Day   [id number]  DOB: May 05, 1928  PCP: Sofie Hartigan, MD    DOA: 03/05/2020 LOS: 8   Brief Narrative   87 y.o. female with medical history of diastolic CHF, COPD not on home O2 , HTN, CKD stage IV, gout, anemia of chronic disease, who presented to the ED from urgent care with complaints of worsening nausea, vomiting, diarrhea and dysuria x 2-3 days. Urgent care referred patient to the ED due to low BP on 90/60 and UA showing infection and labs showing worsened renal function and leukocytosis.  Patient also reported dyspnea worse than baseline, but no hypoxia was noted.      Significant Events: - Admitted 03/05/2020  Assessment & Plan   Active Problems:   UTI (urinary tract infection)   Sepsis due to Escherichia coli with acute renal failure without septic shock (HCC)   Acute diastolic CHF (congestive heart failure) (HCC)   Acute kidney injury superimposed on CKD ([place])   Hypokalemia   Essential hypertension   Hyperlipidemia   Severe sepsis due to E. coli bacteremia and UTI -presented with severe sepsis as evidenced by leukocytosis, tachycardia, fever in the setting of UTI.  AKI superimposed on CKD stage IV reflects organ dysfunction consistent with severe sepsis.  Sepsis physiology improved. Abx: Rocephin >> Ancef >> Keflex --Continue Keflex to complete 7 days given uncomplicated GNR bacteremia --Follow repeat cultures - neg to date   Leukocytosis due to suspected colitis/diverticulitis -WBC was rising peaked at [facility].   Improved to 15 K today,, after Zosyn initiated yesterday. --Retested Covid: negative --Completed short course of Zithromax for ?bronchitis --Follow up pending blood culture - neg to date --Monitor CBC --Infectious disease consulted --Continue Zosyn for suspected colitis/diverticulitis -has responded well --ID recommends 1 week of IV Zosyn --Order for PICC line is placed --TOC and pharmacy have been made aware for  setting up home health infusion  Cough - suspect UACS (upper airway cough syndrome) due to allergic rhinitis and post-nasal drip +/- ?volume overload.  Repeat CXR stable.  Cough seems improved with trial of Mucinex, loratadine and Flonase.  Completed Zithromax for suspected bronchitis.  Acute on chronic diastolic CHF -IV fluids previously held.  Patient appears euvolemic on exam.   Echo - EF 70-75% hyperdynamic LVEF systolic function, grade 1 diastolic dysfunction. --Monitor volume status closely, on fluids for AKI as below  Acute respiratory failure with hypoxia - Resolved.  Pt not on supplemental oxygen at baseline, needed 2 L/min along with subjective shortness of breath.  Weaned off. Pt has elevated R hemidiaphragm, and not mobilizing therefore atelectasis contributes to hypoxia.  No sign of PNA on CXR.  Doubt PE without tachycardia or hemodynamic changes; dyspnea also is stable. Repeat chest xrays have been stable without signs PNA  COPD with mild acute exacerbation - had mild wheezing 2/8, treated with IV steroid, exacerbation resolved.  Per chart review, appears pt used to be prescribed inhalers but stopped them on her own as she did not feel they were helpful.  Pt has been prescribed inhalers in past, did not find them very helpful.   --PRN albuterol  --off steroids and has been stable --Duonebs q6h, Pulmicort nebs BID  AKI superimposed on CKD stage IV -patient received some IV fluids on arrival.  Was subsequently placed on oxygen so fluids were stopped.  Nephrology is consulted.  Monitor BMP.  Hold Lasix. Off IV fluids, monitor.  Hypokalemia -potassium was replaced.  Monitor and replace  as needed.  Diarrhea -present on admission, unclear etiology.  Resolved and later constipated. Patient denies further episodes of diarrhea since admission.  Monitor.  Constipation - stool softeners, PRN laxatives.    Generalized weakness-evaluated by physical therapy and home health PT with 24-hour  supervision was recommended.  Pt lives with daughter, will have 24/7 care. --Continue PT   Essential hypertension - BP's have been elevated this week. Antihypertensive medications initially held due to soft BP.  BP's elevated today.   Continue Cardizem, dose increased to 240 mg daily  BP somewhat better, she has softer diastolic pressures, caution if an additional agent is needed. Hold Lasix.  Monitor BP and resume when indicated. Oral hydralazine PRN.  Nausea vomiting -reported on admission, improved.  Likely secondary to above.  Antiemetics as needed.  Hyperlipidemia -continue Zocor  Obesity: Body mass index is 36.06 kg/m.  Complicates overall care and prognosis.  Recommend physical activity and diet efforts towards weight loss.  Primary care follow-up.  DVT prophylaxis: heparin injection 5,000 Units Start: 03/05/20 2215   Diet:  Diet Orders (From admission, onward)    Start     Ordered   03/06/20 1149  Diet Heart Room service appropriate? Yes; Fluid consistency: Thin  Diet effective now       Question Answer Comment  Room service appropriate? Yes   Fluid consistency: Thin      03/06/20 1149            Code Status: DNR    Subjective 03/13/20    Patient awake laying in bed, daughter at bedside when seen this morning.  She reports feeling fair.  No fevers, says she always stays cold.  Says her cough is about the same today and when she coughs again does sound dry and no longer productive.  She reports her chronic pressure feeling across her abdomen and lower rib cage.  Does endorse some left lower quadrant discomfort.  Daughter states this comes and goes chronically.  Disposition Plan & Communication   Status is: Inpatient  Remains inpatient appropriate because: Patient requires IV antibiotics as above.  Plan for PICC line placement and possible DC home with home health tomorrow.   Dispo: The patient is from: Home              Anticipated d/c is to: Home               Anticipated d/c date is: 1 day              Patient currently is not medically stable to d/c.   Difficult to place patient No  Family communication -daughter was at bedside on rounds today, 2/15. I was unable to reach her later in the day to discuss option of PICC line placement and home IV antibiotics.  Will attempt to reach again later.   Consults, Procedures, Significant Events   Consultants:   Nephrology  Infectious disease  Procedures:   none  Antimicrobials:  Anti-infectives (From admission, onward)   Start     Dose/Rate Route Frequency Ordered Stop   03/12/20 1430  piperacillin-tazobactam (ZOSYN) IVPB 3.375 g        3.375 g 12.5 mL/hr over 240 Minutes Intravenous Every 8 hours 03/12/20 1337     03/11/20 1000  azithromycin (ZITHROMAX) 500 mg in sodium chloride 0.9 % 250 mL IVPB        500 mg 250 mL/hr over 60 Minutes Intravenous Every 24 hours 03/11/20 0904     03/09/20  2200  cephALEXin (KEFLEX) capsule 500 mg  Status:  Discontinued        500 mg Oral Every 12 hours 03/09/20 1253 03/12/20 1318   03/09/20 0800  ceFAZolin (ANCEF) IVPB 1 g/50 mL premix  Status:  Discontinued        1 g 100 mL/hr over 30 Minutes Intravenous Every 12 hours 03/08/20 0932 03/09/20 1253   03/06/20 1000  cefTRIAXone (ROCEPHIN) 2 g in sodium chloride 0.9 % 100 mL IVPB        2 g 200 mL/hr over 30 Minutes Intravenous Every 24 hours 03/06/20 0745 03/08/20 0936   03/05/20 1730  cefTRIAXone (ROCEPHIN) 1 g in sodium chloride 0.9 % 100 mL IVPB  Status:  Discontinued        1 g 200 mL/hr over 30 Minutes Intravenous Every 24 hours 03/05/20 1716 03/06/20 0745        Objective   Vitals:   03/13/20 0332 03/13/20 0500 03/13/20 0802 03/13/20 1135  BP: (!) 153/68  (!) 151/69 (!) 143/67  Pulse: 90  96 98  Resp: '20  19 18  '$ Temp: 98.3 F (36.8 C)  98.6 F (37 C) 98 F (36.7 C)  TempSrc:   Oral Oral  SpO2: 94%  92% 96%  Weight:  95.3 kg    Height:        Intake/Output Summary (Last 24 hours) at  03/13/2020 1503 Last data filed at 03/13/2020 [id number] Gross per 24 hour  Intake 220 ml  Output 700 ml  Net -480 ml   Filed Weights   03/05/20 1649 03/13/20 0500  Weight: 95.3 kg 95.3 kg    Physical Exam:  General exam: awake and alert, laying in bed, no acute distress, obese Respiratory system: Clear bilaterally, no wheezes or rhonchi, normal respiratory effort. Cardiovascular system: normal S1/S2, RRR, no pedal edema.  Gastrointestinal system: mildly tender LLQ on palpation, no guarding or rebound tenderness +bowel sounds Central nervous system: normal speech, CN's grossly intact, grossly nonfocal exam Extremities: moves all, no cyanosis   Labs   Data Reviewed: I have personally reviewed following labs and imaging studies  CBC: Recent Labs  Lab 03/08/20 0705 03/09/20 0516 03/10/20 0512 03/11/20 0526 03/12/20 0421 03/13/20 0348  WBC 16.5* 12.9* 15.5* 19.2*  19.4* 20.4* 15.9*  NEUTROABS 14.8*  --   --  13.5* 14.9* 11.8*  HGB 9.8* 10.2* 10.9* 10.8*  10.7* 10.9* 10.7*  HCT 30.4* 31.2* 32.3* 34.3*  33.6* 33.6* 31.8*  MCV 89.7 88.9 88.3 91.0  90.3 88.9 88.3  PLT 139* 156 182 214  207 231 [id number]   Basic Metabolic Panel: Recent Labs  Lab 03/08/20 0705 03/09/20 0516 03/10/20 0512 03/11/20 0526 03/12/20 0421 03/13/20 0348  NA 139 141 143 143 142 138  K 4.3 4.0 4.4 4.0 4.4 4.4  CL 101 104 107 108 108 107  CO2 '26 25 24 23 22 23  '$ GLUCOSE 152* 128* 129* 75 111* 110*  BUN 70* 72* 74* 70* 61* 51*  CREATININE 3.11* 2.90* 2.58* 2.36* 2.09* 1.94*  CALCIUM 8.3* 8.7* 8.8* 8.7* 8.7* 8.5*  MG 1.7 1.8  --   --   --   --    GFR: Estimated Creatinine Clearance: 22 mL/min (A) (by C-G formula based on SCr of 1.94 mg/dL (H)). Liver Function Tests: No results for input(s): AST, ALT, ALKPHOS, BILITOT, PROT, ALBUMIN in the last 168 hours. No results for input(s): LIPASE, AMYLASE in the last 168 hours. No results for input(s): AMMONIA in the  last 168 hours. Coagulation Profile: No  results for input(s): INR, PROTIME in the last 168 hours. Cardiac Enzymes: No results for input(s): CKTOTAL, CKMB, CKMBINDEX, TROPONINI in the last 168 hours. BNP (last 3 results) No results for input(s): PROBNP in the last 8760 hours. HbA1C: No results for input(s): HGBA1C in the last 72 hours. CBG: Recent Labs  Lab 03/09/20 0817 03/10/20 0811 03/11/20 0750 03/12/20 0746 03/13/20 0757  GLUCAP 115* 104* 81 104* 104*   Lipid Profile: No results for input(s): CHOL, HDL, LDLCALC, TRIG, CHOLHDL, LDLDIRECT in the last 72 hours. Thyroid Function Tests: No results for input(s): TSH, T4TOTAL, FREET4, T3FREE, THYROIDAB in the last 72 hours. Anemia Panel: No results for input(s): VITAMINB12, FOLATE, FERRITIN, TIBC, IRON, RETICCTPCT in the last 72 hours. Sepsis Labs: Recent Labs  Lab 03/10/20 1138 03/12/20 0421 03/13/20 0348  PROCALCITON 11.32 3.42 1.65  LATICACIDVEN 2.4* 0.6  --     Recent Results (from the past 240 hour(s))  Urine culture     Status: Abnormal   Collection Time: 03/05/20  2:09 PM   Specimen: Urine, Clean Catch  Result Value Ref Range Status   Specimen Description   Final    URINE, CLEAN CATCH Performed at [facility], [address], [place], [place] [address]    Special Requests   Final    NONE Performed at [facility], [place] [address], [place], [place] [address]    Culture >=100,000 COLONIES/mL ESCHERICHIA COLI (A)  Final   Report Status 03/08/2020 FINAL  Final   Organism ID, Bacteria ESCHERICHIA COLI (A)  Final      Susceptibility   Escherichia coli - MIC*    AMPICILLIN >=32 RESISTANT Resistant     CEFAZOLIN <=4 SENSITIVE Sensitive     CEFEPIME <=0.12 SENSITIVE Sensitive     CEFTRIAXONE <=0.25 SENSITIVE Sensitive     CIPROFLOXACIN <=0.25 SENSITIVE Sensitive     GENTAMICIN <=1 SENSITIVE Sensitive     IMIPENEM <=0.25 SENSITIVE Sensitive     NITROFURANTOIN <=16 SENSITIVE Sensitive     TRIMETH/SULFA <=20 SENSITIVE Sensitive      AMPICILLIN/SULBACTAM >=32 RESISTANT Resistant     PIP/TAZO <=4 SENSITIVE Sensitive     * >=100,000 COLONIES/mL ESCHERICHIA COLI  SARS Coronavirus 2 by RT PCR (hospital order, performed in [facility] hospital lab) Nasopharyngeal Nasopharyngeal Swab     Status: None   Collection Time: 03/05/20  6:15 PM   Specimen: Nasopharyngeal Swab  Result Value Ref Range Status   SARS Coronavirus 2 NEGATIVE NEGATIVE Final    Comment: (NOTE) SARS-CoV-2 target nucleic acids are NOT DETECTED.  The SARS-CoV-2 RNA is generally detectable in upper and lower respiratory specimens during the acute phase of infection. The lowest concentration of SARS-CoV-2 viral copies this assay can detect is 250 copies / mL. A negative result does not preclude SARS-CoV-2 infection and should not be used as the sole basis for treatment or other patient management decisions.  A negative result may occur with improper specimen collection / handling, submission of specimen other than nasopharyngeal swab, presence of viral mutation(s) within the areas targeted by this assay, and inadequate number of viral copies (<250 copies / mL). A negative result must be combined with clinical observations, patient history, and epidemiological information.  Fact Sheet for Patients:   StrictlyIdeas.no  Fact Sheet for Healthcare Providers: BankingDealers.co.za  This test is not yet approved or  cleared by the [place] FDA and has been authorized for detection and/or diagnosis of SARS-CoV-2  by FDA under an Emergency Use Authorization (EUA).  This EUA will remain in effect (meaning this test can be used) for the duration of the COVID-19 declaration under Section 564(b)(1) of the Act, 21 U.S.C. section 360bbb-3(b)(1), unless the authorization is terminated or revoked sooner.  Performed at [facility], [address] [place], [place] [address]   Blood Culture (routine x 2)      Status: Abnormal   Collection Time: 03/05/20  6:15 PM   Specimen: BLOOD  Result Value Ref Range Status   Specimen Description   Final    BLOOD LEFT ASSIST CONTROL Performed at [facility], [address] [place], [place] [address]    Special Requests   Final    BOTTLES DRAWN AEROBIC AND ANAEROBIC Blood Culture results may not be optimal due to an inadequate volume of blood received in culture bottles Performed at [facility], [address], [place], [place] [address]    Culture  Setup Time   Final    GRAM NEGATIVE RODS IN BOTH AEROBIC AND ANAEROBIC BOTTLES CRITICAL RESULT CALLED TO, READ BACK BY AND VERIFIED WITHLloyd Huger AT [id number] 03/06/20 SDR Performed at [facility], [place] [address] [place], [place] [address]    Culture ESCHERICHIA COLI (A)  Final   Report Status 03/08/2020 FINAL  Final   Organism ID, Bacteria ESCHERICHIA COLI  Final      Susceptibility   Escherichia coli - MIC*    AMPICILLIN >=32 RESISTANT Resistant     CEFAZOLIN <=4 SENSITIVE Sensitive     CEFEPIME <=0.12 SENSITIVE Sensitive     CEFTAZIDIME <=1 SENSITIVE Sensitive     CEFTRIAXONE <=0.25 SENSITIVE Sensitive     CIPROFLOXACIN <=0.25 SENSITIVE Sensitive     GENTAMICIN <=1 SENSITIVE Sensitive     IMIPENEM <=0.25 SENSITIVE Sensitive     TRIMETH/SULFA <=20 SENSITIVE Sensitive     AMPICILLIN/SULBACTAM >=32 RESISTANT Resistant     PIP/TAZO <=4 SENSITIVE Sensitive     * ESCHERICHIA COLI  Blood Culture (routine x 2)     Status: Abnormal   Collection Time: 03/05/20  6:15 PM   Specimen: BLOOD  Result Value Ref Range Status   Specimen Description   Final    BLOOD RIGHT ASSIST CONTROL Performed at [facility], [address] [place], [place] [address]    Special Requests   Final    BOTTLES DRAWN AEROBIC AND ANAEROBIC Blood Culture results may not be optimal due to an inadequate volume of blood received in culture bottles Performed at [facility], [address] [place], [place] [address]    Culture  Setup Time   Final    GRAM NEGATIVE RODS IN BOTH AEROBIC AND ANAEROBIC BOTTLES CRITICAL VALUE NOTED.  VALUE IS CONSISTENT WITH PREVIOUSLY REPORTED AND CALLED VALUE. Performed at [facility], [place] [address] [place], [place] [address]    Culture (A)  Final    ESCHERICHIA COLI SUSCEPTIBILITIES PERFORMED ON PREVIOUS CULTURE WITHIN THE LAST 5 DAYS. Performed at [facility], [place] [address] [place],  [address]    Report Status 03/08/2020 FINAL  Final  Blood Culture ID Panel (Reflexed)     Status: Abnormal   Collection Time: 03/05/20  6:15 PM  Result Value Ref Range Status   Enterococcus faecalis NOT DETECTED NOT DETECTED Final   Enterococcus Faecium NOT DETECTED NOT DETECTED Final   Listeria monocytogenes NOT DETECTED NOT DETECTED Final   Staphylococcus species NOT DETECTED NOT  DETECTED Final   Staphylococcus aureus (BCID) NOT DETECTED NOT DETECTED Final   Staphylococcus epidermidis NOT DETECTED NOT DETECTED Final   Staphylococcus lugdunensis NOT DETECTED NOT DETECTED Final   Streptococcus species NOT DETECTED NOT DETECTED Final   Streptococcus agalactiae NOT DETECTED NOT DETECTED Final   Streptococcus pneumoniae NOT DETECTED NOT DETECTED Final   Streptococcus pyogenes NOT DETECTED NOT DETECTED Final   A.calcoaceticus-baumannii NOT DETECTED NOT DETECTED Final   Bacteroides fragilis NOT DETECTED NOT DETECTED Final   Enterobacterales DETECTED (A) NOT DETECTED Final    Comment: Enterobacterales represent a large order of gram negative bacteria, not a single organism. CRITICAL RESULT CALLED TO, READ BACK BY AND VERIFIED WITH:  NATHAN BELUE AT [id number] 03/06/20 SDR    Enterobacter cloacae complex NOT DETECTED NOT DETECTED Final   Escherichia coli DETECTED (A) NOT DETECTED Final    Comment: CRITICAL RESULT CALLED TO, READ BACK BY AND VERIFIED WITH:  NATHAN BELUE AT [id number] 03/06/20 SDR    Klebsiella aerogenes NOT DETECTED NOT  DETECTED Final   Klebsiella oxytoca NOT DETECTED NOT DETECTED Final   Klebsiella pneumoniae NOT DETECTED NOT DETECTED Final   Proteus species NOT DETECTED NOT DETECTED Final   Salmonella species NOT DETECTED NOT DETECTED Final   Serratia marcescens NOT DETECTED NOT DETECTED Final   Haemophilus influenzae NOT DETECTED NOT DETECTED Final   Neisseria meningitidis NOT DETECTED NOT DETECTED Final   Pseudomonas aeruginosa NOT DETECTED NOT DETECTED Final   Stenotrophomonas maltophilia NOT DETECTED NOT DETECTED Final   Candida albicans NOT DETECTED NOT DETECTED Final   Candida auris NOT DETECTED NOT DETECTED Final   Candida glabrata NOT DETECTED NOT DETECTED Final   Candida krusei NOT DETECTED NOT DETECTED Final   Candida parapsilosis NOT DETECTED NOT DETECTED Final   Candida tropicalis NOT DETECTED NOT DETECTED Final   Cryptococcus neoformans/gattii NOT DETECTED NOT DETECTED Final   CTX-M ESBL NOT DETECTED NOT DETECTED Final   Carbapenem resistance IMP NOT DETECTED NOT DETECTED Final   Carbapenem resistance KPC NOT DETECTED NOT DETECTED Final   Carbapenem resistance NDM NOT DETECTED NOT DETECTED Final   Carbapenem resist OXA 48 LIKE NOT DETECTED NOT DETECTED Final   Carbapenem resistance VIM NOT DETECTED NOT DETECTED Final    Comment: Performed at [facility], [address] [place], [place] [address]  Culture, blood (single) w Reflex to ID Panel     Status: None (Preliminary result)   Collection Time: 03/10/20  3:44 PM   Specimen: BLOOD  Result Value Ref Range Status   Specimen Description BLOOD BLOOD LEFT HAND  Final   Special Requests   Final    BOTTLES DRAWN AEROBIC AND ANAEROBIC Blood Culture adequate volume   Culture   Final    NO GROWTH 3 DAYS Performed at [facility], [address] [place], [place] [address]    Report Status PENDING  Incomplete  SARS CORONAVIRUS 2 (TAT 6-24 HRS) Nasopharyngeal Nasopharyngeal Swab     Status: None   Collection Time:  03/11/20 11:28 AM   Specimen: Nasopharyngeal Swab  Result Value Ref Range Status   SARS Coronavirus 2 NEGATIVE NEGATIVE Final    Comment: (NOTE) SARS-CoV-2 target nucleic acids are NOT DETECTED.  The SARS-CoV-2 RNA is generally detectable in upper and lower respiratory specimens during the acute phase of infection. Negative results do not preclude SARS-CoV-2 infection, do not rule out co-infections with other pathogens, and should not be used as the sole basis for treatment or other patient management decisions.  Negative results must be combined with clinical observations, patient history, and epidemiological information. The expected result is Negative.  Fact Sheet for Patients: SugarRoll.be  Fact Sheet for Healthcare Providers: https://www.woods-mathews.com/  This test is not yet approved or cleared by the [place] FDA and  has been authorized for detection and/or diagnosis of SARS-CoV-2 by FDA under an Emergency Use Authorization (EUA). This EUA will remain  in effect (meaning this test can be used) for the duration of the COVID-19 declaration under Se ction 564(b)(1) of the Act, 21 U.S.C. section 360bbb-3(b)(1), unless the authorization is terminated or revoked sooner.  Performed at [facility], [place] [address], [place], [place] [address]   Group A Strep by PCR     Status: None   Collection Time: 03/12/20  1:09 PM   Specimen: Throat; Sterile Swab  Result Value Ref Range Status   Group A Strep by PCR NOT DETECTED NOT DETECTED Final    Comment: Performed at [facility], [address] [place], [place] [address]  Respiratory (~20 pathogens) panel by PCR     Status: None   Collection Time: 03/12/20  1:09 PM   Specimen: Nasopharyngeal Swab; Respiratory  Result Value Ref Range Status   Adenovirus NOT DETECTED NOT DETECTED Final   Coronavirus 229E NOT DETECTED NOT DETECTED Final    Comment: (NOTE) The  Coronavirus on the Respiratory Panel, DOES NOT test for the novel  Coronavirus (2019 nCoV)    Coronavirus HKU1 NOT DETECTED NOT DETECTED Final   Coronavirus NL63 NOT DETECTED NOT DETECTED Final   Coronavirus OC43 NOT DETECTED NOT DETECTED Final   Metapneumovirus NOT DETECTED NOT DETECTED Final   Rhinovirus / Enterovirus NOT DETECTED NOT DETECTED Final   Influenza A NOT DETECTED NOT DETECTED Final   Influenza B NOT DETECTED NOT DETECTED Final   Parainfluenza Virus 1 NOT DETECTED NOT DETECTED Final   Parainfluenza Virus 2 NOT DETECTED NOT DETECTED Final   Parainfluenza Virus 3 NOT DETECTED NOT DETECTED Final   Parainfluenza Virus 4 NOT DETECTED NOT DETECTED Final   Respiratory Syncytial Virus NOT DETECTED NOT DETECTED Final   Bordetella pertussis NOT DETECTED NOT DETECTED Final   Bordetella Parapertussis NOT DETECTED NOT DETECTED Final   Chlamydophila pneumoniae NOT DETECTED NOT DETECTED Final   Mycoplasma pneumoniae NOT DETECTED NOT DETECTED Final    Comment: Performed at [facility], [place]. [address] [place], [place] [address]      Imaging Studies   CT ABDOMEN PELVIS WO CONTRAST  Result Date: 03/12/2020 CLINICAL DATA:  Worsening nausea, vomiting and diarrhea, dysuria EXAM: CT ABDOMEN AND PELVIS WITHOUT CONTRAST TECHNIQUE: Multidetector CT imaging of the abdomen and pelvis was performed following the standard protocol without IV contrast. COMPARISON:  03/08/2020, 03/05/2020 FINDINGS: Lower chest: No acute pleural or parenchymal lung disease. Unenhanced CT was performed per clinician order. Lack of IV contrast limits sensitivity and specificity, especially for evaluation of abdominal/pelvic solid viscera. Hepatobiliary: No focal liver abnormality is seen. Status post cholecystectomy. No biliary dilatation. Pancreas: Unremarkable. No pancreatic ductal dilatation or surrounding inflammatory changes. Spleen: Normal in size without focal abnormality. Adrenals/Urinary Tract: Stable  bilateral adrenal glands. Exophytic right renal cyst unchanged. No urinary tract calculi or obstructive uropathy. Bladder is unremarkable. Stomach/Bowel: No bowel obstruction or ileus. Diverticulosis of the sigmoid colon without evidence of acute diverticulitis. Chronic wall thickening of the proximal sigmoid colon likely reflects postinflammatory scarring. No signs of active inflammation. Vascular/Lymphatic: Aortic atherosclerosis. No enlarged abdominal or pelvic lymph nodes.  Reproductive: Status post hysterectomy. No adnexal masses. Other: No free fluid or free gas.  No abdominal wall hernia. Musculoskeletal: No acute or destructive bony lesions. Mild subcutaneous edema in the bilateral flanks. Reconstructed images demonstrate no additional findings. IMPRESSION: 1. No urinary tract calculi or obstructive uropathy. 2. Sigmoid diverticulosis without diverticulitis. Likely postinflammatory scarring of the sigmoid colon unchanged. 3.  Emphysema (ICD10-J43.9). Electronically Signed   By: Randa Ngo M.D.   On: 03/12/2020 23:08   [place] EKG SITE RITE  Result Date: 03/13/2020 If Site Rite image not attached, placement could not be confirmed due to current cardiac rhythm.    Medications   Scheduled Meds: . allopurinol  100 mg Oral Daily  . atorvastatin  20 mg Oral QPM  . budesonide (PULMICORT) nebulizer solution  0.5 mg Nebulization BID  . calcitRIOL  0.25 mcg Oral Daily  . dextromethorphan-guaiFENesin  1 tablet Oral BID  . diltiazem  240 mg Oral Daily  . fluticasone  2 spray Each Nare Daily  . heparin  5,000 Units Subcutaneous Q8H  . ipratropium-albuterol  3 mL Nebulization TID  . loratadine  10 mg Oral Daily  . multivitamin-lutein  1 capsule Oral Daily  . pantoprazole  40 mg Oral Daily   Continuous Infusions: . sodium chloride 1,000 mL (03/11/20 1235)  . azithromycin 500 mg (03/13/20 1100)  . piperacillin-tazobactam (ZOSYN)  IV 3.375 g (03/13/20 1442)       LOS: 8 days    Time spent: 30  minutes with greater than 50% spent at bedside and in coordination of care.    Ezekiel Slocumb, DO [facility]  03/13/2020, 3:03 PM      If 7PM-7AM, please contact night-coverage. How to contact the [facility] Attending or Consulting provider [place] or covering provider during after hours [place], for this patient?    1. Check the care team in [facility] and look for a) attending/consulting TRH provider listed and b) the [facility] team listed 2. Log into [email or web address] and use [place]'s universal password to access. If you do not have the password, please contact the hospital operator. 3. Locate the [facility] provider you are looking for under [facility] and page to a number that you can be directly reached. 4. If you still have difficulty reaching the provider, please page the [facility] (Director on Call) for the Hospitalists listed on amion for assistance.

## 2020-03-13 NOTE — Progress Notes (Signed)
Talked to the daughter and agreed to proceed with the Midline insertion.

## 2020-03-13 NOTE — Progress Notes (Signed)
[facility]  ROUNDING NOTE   Subjective:   Patient is seen while laying in bed this morning She says she feels fine today She has an intermittent productive cough. She is able to eat meals without nausea She denies chest pain.  Currently on room air  Objective:  Vital signs in last 24 hours:  Temp:  [97.5 F (36.4 C)-98.6 F (37 C)] 98 F (36.7 C) (02/15 1135) Pulse Rate:  [89-98] 98 (02/15 1135) Resp:  [18-20] 18 (02/15 1135) BP: (143-169)/(66-80) 143/67 (02/15 1135) SpO2:  [92 %-97 %] 96 % (02/15 1135) Weight:  [95.3 kg] 95.3 kg (02/15 0500)  Weight change:  Filed Weights   03/05/20 1649 03/13/20 0500  Weight: 95.3 kg 95.3 kg    Intake/Output: I/O last 3 completed shifts: In: 580 [P.O.:480; IV Piggyback:100] Out: 1300 [Urine:1300]   Intake/Output this shift:  No intake/output data recorded.  Physical Exam: General: NAD, laying in bed  Head: Normocephalic, atraumatic. Moist oral mucosal membranes  Eyes: Anicteric  Neck: Supple, trachea midline  Lungs:  Crackles in the bases, wheezing, cough  Heart: Regular rate and rhythm  Abdomen:  Soft, nontender  Extremities:  minimal peripheral edema.  Neurologic: Alert and oriented   Skin: No lesions       Basic Metabolic Panel: Recent Labs  Lab 03/08/20 0705 03/09/20 0516 03/10/20 0512 03/11/20 0526 03/12/20 0421 03/13/20 0348  NA 139 141 143 143 142 138  K 4.3 4.0 4.4 4.0 4.4 4.4  CL 101 104 107 108 108 107  CO2 '26 25 24 23 22 23  '$ GLUCOSE 152* 128* 129* 75 111* 110*  BUN 70* 72* 74* 70* 61* 51*  CREATININE 3.11* 2.90* 2.58* 2.36* 2.09* 1.94*  CALCIUM 8.3* 8.7* 8.8* 8.7* 8.7* 8.5*  MG 1.7 1.8  --   --   --   --     Liver Function Tests: No results for input(s): AST, ALT, ALKPHOS, BILITOT, PROT, ALBUMIN in the last 168 hours. No results for input(s): LIPASE, AMYLASE in the last 168 hours. No results for input(s): AMMONIA in the last 168 hours.  CBC: Recent Labs  Lab 03/08/20 0705  03/09/20 0516 03/10/20 0512 03/11/20 0526 03/12/20 0421 03/13/20 0348  WBC 16.5* 12.9* 15.5* 19.2*  19.4* 20.4* 15.9*  NEUTROABS 14.8*  --   --  13.5* 14.9* 11.8*  HGB 9.8* 10.2* 10.9* 10.8*  10.7* 10.9* 10.7*  HCT 30.4* 31.2* 32.3* 34.3*  33.6* 33.6* 31.8*  MCV 89.7 88.9 88.3 91.0  90.3 88.9 88.3  PLT 139* 156 182 214  207 231 227    Cardiac Enzymes: No results for input(s): CKTOTAL, CKMB, CKMBINDEX, TROPONINI in the last 168 hours.  BNP: Invalid input(s): POCBNP  CBG: Recent Labs  Lab 03/09/20 0817 03/10/20 0811 03/11/20 0750 03/12/20 0746 03/13/20 0757  GLUCAP 115* 104* 81 104* 104*    Microbiology: Results for orders placed or performed during the hospital encounter of 03/05/20  SARS Coronavirus 2 by RT PCR (hospital order, performed in [facility] hospital lab) Nasopharyngeal Nasopharyngeal Swab     Status: None   Collection Time: 03/05/20  6:15 PM   Specimen: Nasopharyngeal Swab  Result Value Ref Range Status   SARS Coronavirus 2 NEGATIVE NEGATIVE Final    Comment: (NOTE) SARS-CoV-2 target nucleic acids are NOT DETECTED.  The SARS-CoV-2 RNA is generally detectable in upper and lower respiratory specimens during the acute phase of infection. The lowest concentration of SARS-CoV-2 viral copies this assay can detect is 250 copies /  mL. A negative result does not preclude SARS-CoV-2 infection and should not be used as the sole basis for treatment or other patient management decisions.  A negative result may occur with improper specimen collection / handling, submission of specimen other than nasopharyngeal swab, presence of viral mutation(s) within the areas targeted by this assay, and inadequate number of viral copies (<250 copies / mL). A negative result must be combined with clinical observations, patient history, and epidemiological information.  Fact Sheet for Patients:   StrictlyIdeas.no  Fact Sheet for Healthcare  Providers: BankingDealers.co.za  This test is not yet approved or  cleared by the [place] FDA and has been authorized for detection and/or diagnosis of SARS-CoV-2 by FDA under an Emergency Use Authorization (EUA).  This EUA will remain in effect (meaning this test can be used) for the duration of the COVID-19 declaration under Section 564(b)(1) of the Act, 21 U.S.C. section 360bbb-3(b)(1), unless the authorization is terminated or revoked sooner.  Performed at [facility], [address], [place], [place] [address]   Blood Culture (routine x 2)     Status: Abnormal   Collection Time: 03/05/20  6:15 PM   Specimen: BLOOD  Result Value Ref Range Status   Specimen Description   Final    BLOOD LEFT ASSIST CONTROL Performed at [facility], [address] [place], [place] [address]    Special Requests   Final    BOTTLES DRAWN AEROBIC AND ANAEROBIC Blood Culture results may not be optimal due to an inadequate volume of blood received in culture bottles Performed at [facility], [address] [place], [place] [address]    Culture  Setup Time   Final    GRAM NEGATIVE RODS IN BOTH AEROBIC AND ANAEROBIC BOTTLES CRITICAL RESULT CALLED TO, READ BACK BY AND VERIFIED WITHLloyd Huger AT [id number] 03/06/20 SDR Performed at [facility], [place] [address], [place], [place] [address]    Culture ESCHERICHIA COLI (A)  Final   Report Status 03/08/2020 FINAL  Final   Organism ID, Bacteria ESCHERICHIA COLI  Final      Susceptibility   Escherichia coli - MIC*    AMPICILLIN >=32 RESISTANT Resistant     CEFAZOLIN <=4 SENSITIVE Sensitive     CEFEPIME <=0.12 SENSITIVE Sensitive     CEFTAZIDIME <=1 SENSITIVE Sensitive     CEFTRIAXONE <=0.25 SENSITIVE Sensitive     CIPROFLOXACIN <=0.25 SENSITIVE Sensitive     GENTAMICIN <=1 SENSITIVE Sensitive     IMIPENEM <=0.25 SENSITIVE Sensitive     TRIMETH/SULFA <=20 SENSITIVE Sensitive      AMPICILLIN/SULBACTAM >=32 RESISTANT Resistant     PIP/TAZO <=4 SENSITIVE Sensitive     * ESCHERICHIA COLI  Blood Culture (routine x 2)     Status: Abnormal   Collection Time: 03/05/20  6:15 PM   Specimen: BLOOD  Result Value Ref Range Status   Specimen Description   Final    BLOOD RIGHT ASSIST CONTROL Performed at [facility], [address], [place], [place] [address]    Special Requests   Final    BOTTLES DRAWN AEROBIC AND ANAEROBIC Blood Culture results may not be optimal due to an inadequate volume of blood received in culture bottles Performed at [facility], [address] [place], [place] [address]    Culture  Setup Time   Final    GRAM NEGATIVE RODS IN BOTH AEROBIC AND ANAEROBIC BOTTLES CRITICAL VALUE NOTED.  VALUE IS CONSISTENT WITH PREVIOUSLY REPORTED  AND CALLED VALUE. Performed at [facility], [address] [place], [place] [address]    Culture (A)  Final    ESCHERICHIA COLI SUSCEPTIBILITIES PERFORMED ON PREVIOUS CULTURE WITHIN THE LAST 5 DAYS. Performed at [facility], [place] [address] [place], [place] [address]    Report Status 03/08/2020 FINAL  Final  Blood Culture ID Panel (Reflexed)     Status: Abnormal   Collection Time: 03/05/20  6:15 PM  Result Value Ref Range Status   Enterococcus faecalis NOT DETECTED NOT DETECTED Final   Enterococcus Faecium NOT DETECTED NOT DETECTED Final   Listeria monocytogenes NOT DETECTED NOT DETECTED Final   Staphylococcus species NOT DETECTED NOT DETECTED Final   Staphylococcus aureus (BCID) NOT DETECTED NOT DETECTED Final   Staphylococcus epidermidis NOT DETECTED NOT DETECTED Final   Staphylococcus lugdunensis NOT DETECTED NOT DETECTED Final   Streptococcus species NOT DETECTED NOT DETECTED Final   Streptococcus agalactiae NOT DETECTED NOT DETECTED Final   Streptococcus pneumoniae NOT DETECTED NOT DETECTED Final   Streptococcus pyogenes NOT DETECTED NOT DETECTED Final    A.calcoaceticus-baumannii NOT DETECTED NOT DETECTED Final   Bacteroides fragilis NOT DETECTED NOT DETECTED Final   Enterobacterales DETECTED (A) NOT DETECTED Final    Comment: Enterobacterales represent a large order of gram negative bacteria, not a single organism. CRITICAL RESULT CALLED TO, READ BACK BY AND VERIFIED WITH:  NATHAN BELUE AT [id number] 03/06/20 SDR    Enterobacter cloacae complex NOT DETECTED NOT DETECTED Final   Escherichia coli DETECTED (A) NOT DETECTED Final    Comment: CRITICAL RESULT CALLED TO, READ BACK BY AND VERIFIED WITH:  NATHAN BELUE AT [id number] 03/06/20 SDR    Klebsiella aerogenes NOT DETECTED NOT DETECTED Final   Klebsiella oxytoca NOT DETECTED NOT DETECTED Final   Klebsiella pneumoniae NOT DETECTED NOT DETECTED Final   Proteus species NOT DETECTED NOT DETECTED Final   Salmonella species NOT DETECTED NOT DETECTED Final   Serratia marcescens NOT DETECTED NOT DETECTED Final   Haemophilus influenzae NOT DETECTED NOT DETECTED Final   Neisseria meningitidis NOT DETECTED NOT DETECTED Final   Pseudomonas aeruginosa NOT DETECTED NOT DETECTED Final   Stenotrophomonas maltophilia NOT DETECTED NOT DETECTED Final   Candida albicans NOT DETECTED NOT DETECTED Final   Candida auris NOT DETECTED NOT DETECTED Final   Candida glabrata NOT DETECTED NOT DETECTED Final   Candida krusei NOT DETECTED NOT DETECTED Final   Candida parapsilosis NOT DETECTED NOT DETECTED Final   Candida tropicalis NOT DETECTED NOT DETECTED Final   Cryptococcus neoformans/gattii NOT DETECTED NOT DETECTED Final   CTX-M ESBL NOT DETECTED NOT DETECTED Final   Carbapenem resistance IMP NOT DETECTED NOT DETECTED Final   Carbapenem resistance KPC NOT DETECTED NOT DETECTED Final   Carbapenem resistance NDM NOT DETECTED NOT DETECTED Final   Carbapenem resist OXA 48 LIKE NOT DETECTED NOT DETECTED Final   Carbapenem resistance VIM NOT DETECTED NOT DETECTED Final    Comment: Performed at [facility], [address] [place], [place] [address]  Culture, blood (single) w Reflex to ID Panel     Status: None (Preliminary result)   Collection Time: 03/10/20  3:44 PM   Specimen: BLOOD  Result Value Ref Range Status   Specimen Description BLOOD BLOOD LEFT HAND  Final   Special Requests   Final    BOTTLES DRAWN AEROBIC AND ANAEROBIC Blood Culture adequate volume   Culture   Final    NO GROWTH 3 DAYS Performed at [facility],  [place], [place] [address]    Report Status PENDING  Incomplete  SARS CORONAVIRUS 2 (TAT 6-24 HRS) Nasopharyngeal Nasopharyngeal Swab     Status: None   Collection Time: 03/11/20 11:28 AM   Specimen: Nasopharyngeal Swab  Result Value Ref Range Status   SARS Coronavirus 2 NEGATIVE NEGATIVE Final    Comment: (NOTE) SARS-CoV-2 target nucleic acids are NOT DETECTED.  The SARS-CoV-2 RNA is generally detectable in upper and lower respiratory specimens during the acute phase of infection. Negative results do not preclude SARS-CoV-2 infection, do not rule out co-infections with other pathogens, and should not be used as the sole basis for treatment or other patient management decisions. Negative results must be combined with clinical observations, patient history, and epidemiological information. The expected result is Negative.  Fact Sheet for Patients: SugarRoll.be  Fact Sheet for Healthcare Providers: https://www.woods-mathews.com/  This test is not yet approved or cleared by the [place] FDA and  has been authorized for detection and/or diagnosis of SARS-CoV-2 by FDA under an Emergency Use Authorization (EUA). This EUA will remain  in effect (meaning this test can be used) for the duration of the COVID-19 declaration under Se ction 564(b)(1) of the Act, 21 U.S.C. section 360bbb-3(b)(1), unless the authorization is terminated or revoked sooner.  Performed at [facility], [place] [address], [place], [place] [address]   Group A Strep by PCR     Status: None   Collection Time: 03/12/20  1:09 PM   Specimen: Throat; Sterile Swab  Result Value Ref Range Status   Group A Strep by PCR NOT DETECTED NOT DETECTED Final    Comment: Performed at [facility], [address] [place], [place] [address]  Respiratory (~20 pathogens) panel by PCR     Status: None   Collection Time: 03/12/20  1:09 PM   Specimen: Nasopharyngeal Swab; Respiratory  Result Value Ref Range Status   Adenovirus NOT DETECTED NOT DETECTED Final   Coronavirus 229E NOT DETECTED NOT DETECTED Final    Comment: (NOTE) The Coronavirus on the Respiratory Panel, DOES NOT test for the novel  Coronavirus (2019 nCoV)    Coronavirus HKU1 NOT DETECTED NOT DETECTED Final   Coronavirus NL63 NOT DETECTED NOT DETECTED Final   Coronavirus OC43 NOT DETECTED NOT DETECTED Final   Metapneumovirus NOT DETECTED NOT DETECTED Final   Rhinovirus / Enterovirus NOT DETECTED NOT DETECTED Final   Influenza A NOT DETECTED NOT DETECTED Final   Influenza B NOT DETECTED NOT DETECTED Final   Parainfluenza Virus 1 NOT DETECTED NOT DETECTED Final   Parainfluenza Virus 2 NOT DETECTED NOT DETECTED Final   Parainfluenza Virus 3 NOT DETECTED NOT DETECTED Final   Parainfluenza Virus 4 NOT DETECTED NOT DETECTED Final   Respiratory Syncytial Virus NOT DETECTED NOT DETECTED Final   Bordetella pertussis NOT DETECTED NOT DETECTED Final   Bordetella Parapertussis NOT DETECTED NOT DETECTED Final   Chlamydophila pneumoniae NOT DETECTED NOT DETECTED Final   Mycoplasma pneumoniae NOT DETECTED NOT DETECTED Final    Comment: Performed at [facility], [place]. [address] [place], [place] [address]    Coagulation Studies: No results for input(s): LABPROT, INR in the last 72 hours.  Urinalysis: No results for input(s): COLORURINE, LABSPEC, PHURINE, GLUCOSEU, HGBUR, BILIRUBINUR, KETONESUR, PROTEINUR, UROBILINOGEN, NITRITE, LEUKOCYTESUR in the last  72 hours.  Invalid input(s): APPERANCEUR    Imaging: CT ABDOMEN PELVIS WO CONTRAST  Result Date: 03/12/2020 CLINICAL DATA:  Worsening nausea, vomiting and diarrhea,  dysuria EXAM: CT ABDOMEN AND PELVIS WITHOUT CONTRAST TECHNIQUE: Multidetector CT imaging of the abdomen and pelvis was performed following the standard protocol without IV contrast. COMPARISON:  03/08/2020, 03/05/2020 FINDINGS: Lower chest: No acute pleural or parenchymal lung disease. Unenhanced CT was performed per clinician order. Lack of IV contrast limits sensitivity and specificity, especially for evaluation of abdominal/pelvic solid viscera. Hepatobiliary: No focal liver abnormality is seen. Status post cholecystectomy. No biliary dilatation. Pancreas: Unremarkable. No pancreatic ductal dilatation or surrounding inflammatory changes. Spleen: Normal in size without focal abnormality. Adrenals/Urinary Tract: Stable bilateral adrenal glands. Exophytic right renal cyst unchanged. No urinary tract calculi or obstructive uropathy. Bladder is unremarkable. Stomach/Bowel: No bowel obstruction or ileus. Diverticulosis of the sigmoid colon without evidence of acute diverticulitis. Chronic wall thickening of the proximal sigmoid colon likely reflects postinflammatory scarring. No signs of active inflammation. Vascular/Lymphatic: Aortic atherosclerosis. No enlarged abdominal or pelvic lymph nodes. Reproductive: Status post hysterectomy. No adnexal masses. Other: No free fluid or free gas.  No abdominal wall hernia. Musculoskeletal: No acute or destructive bony lesions. Mild subcutaneous edema in the bilateral flanks. Reconstructed images demonstrate no additional findings. IMPRESSION: 1. No urinary tract calculi or obstructive uropathy. 2. Sigmoid diverticulosis without diverticulitis. Likely postinflammatory scarring of the sigmoid colon unchanged. 3.  Emphysema (ICD10-J43.9). Electronically Signed   By: Randa Ngo M.D.   On: 03/12/2020 23:08      Medications:   . sodium chloride 1,000 mL (03/11/20 1235)  . azithromycin 500 mg (03/13/20 1100)  . piperacillin-tazobactam (ZOSYN)  IV 3.375 g (03/13/20 0609)   . allopurinol  100 mg Oral Daily  . atorvastatin  20 mg Oral QPM  . budesonide (PULMICORT) nebulizer solution  0.5 mg Nebulization BID  . calcitRIOL  0.25 mcg Oral Daily  . dextromethorphan-guaiFENesin  1 tablet Oral BID  . diltiazem  240 mg Oral Daily  . fluticasone  2 spray Each Nare Daily  . heparin  5,000 Units Subcutaneous Q8H  . ipratropium-albuterol  3 mL Nebulization TID  . loratadine  10 mg Oral Daily  . multivitamin-lutein  1 capsule Oral Daily  . pantoprazole  40 mg Oral Daily   sodium chloride, acetaminophen **OR** acetaminophen, albuterol, guaiFENesin-dextromethorphan, hydrALAZINE, lip balm, menthol-cetylpyridinium, ondansetron **OR** ondansetron (ZOFRAN) IV, phenol  Assessment/ Plan:  Ms. ALEASIA FUGE is a 87 y.o.  female with PMHX significant of COPD, CHF per, HTN, CKD stage 4, gout, and anemia of chronic disease. She presents to the ED with c/o nausea, vomiting, diarrhea, and dysuria for 2-3 days.   1.Acute kidney injury in the setting of chronic kidney disease stage 4 Likely caused by dehydration prior to admission Baseline creatinine of 1.8 in Nov 2021.  Improving creatinine 1.94.  2. Hypertension  - Blood pressure continues to be elevated -currently prescribed Cardizem -history positive for HTN   2. UTI with sepsis Completed Keflex treatment  Started Zosyn  Primary team will manage  3. Gastroenteritis Denies diarrhea, nausea and vomiting   LOS: 8 Shantelle Breeze,NP 2/15/20221:35 PM

## 2020-03-13 NOTE — Plan of Care (Signed)

## 2020-03-14 DIAGNOSIS — N189 Chronic kidney disease, unspecified: Secondary | ICD-10-CM | POA: Diagnosis not present

## 2020-03-14 DIAGNOSIS — N179 Acute kidney failure, unspecified: Secondary | ICD-10-CM | POA: Diagnosis not present

## 2020-03-14 DIAGNOSIS — R6520 Severe sepsis without septic shock: Secondary | ICD-10-CM | POA: Diagnosis not present

## 2020-03-14 DIAGNOSIS — N3001 Acute cystitis with hematuria: Secondary | ICD-10-CM | POA: Diagnosis not present

## 2020-03-14 DIAGNOSIS — A4151 Sepsis due to Escherichia coli [E. coli]: Secondary | ICD-10-CM | POA: Diagnosis not present

## 2020-03-14 DIAGNOSIS — I5031 Acute diastolic (congestive) heart failure: Secondary | ICD-10-CM | POA: Diagnosis not present

## 2020-03-14 LAB — CBC
HCT: 28.8 % — ABNORMAL LOW (ref 36.0–46.0)
Hemoglobin: 9.2 g/dL — ABNORMAL LOW (ref 12.0–15.0)
MCH: 28.8 pg (ref 26.0–34.0)
MCHC: 31.9 g/dL (ref 30.0–36.0)
MCV: 90 fL (ref 80.0–100.0)
Platelets: 210 10*3/uL (ref 150–400)
RBC: 3.2 MIL/uL — ABNORMAL LOW (ref 3.87–5.11)
RDW: 14 % (ref 11.5–15.5)
WBC: 14.3 10*3/uL — ABNORMAL HIGH (ref 4.0–10.5)
nRBC: 0 % (ref 0.0–0.2)

## 2020-03-14 LAB — BASIC METABOLIC PANEL
Anion gap: 6 (ref 5–15)
BUN: 42 mg/dL — ABNORMAL HIGH (ref 8–23)
CO2: 23 mmol/L (ref 22–32)
Calcium: 8.2 mg/dL — ABNORMAL LOW (ref 8.9–10.3)
Chloride: 110 mmol/L (ref 98–111)
Creatinine, Ser: 1.98 mg/dL — ABNORMAL HIGH (ref 0.44–1.00)
GFR, Estimated: 24 mL/min — ABNORMAL LOW (ref >60)
Glucose, Bld: 101 mg/dL — ABNORMAL HIGH (ref 70–99)
Potassium: 4.7 mmol/L (ref 3.5–5.1)
Sodium: 139 mmol/L (ref 135–145)

## 2020-03-14 LAB — GLUCOSE, CAPILLARY: Glucose-Capillary: 87 mg/dL (ref 70–99)

## 2020-03-14 MED ORDER — DILTIAZEM HCL ER COATED BEADS 240 MG PO CP24: 240.0000 mg | ORAL_CAPSULE | Freq: Every day | ORAL | 0 refills | Status: DC

## 2020-03-14 MED ORDER — SODIUM CHLORIDE 0.9% FLUSH: 10.0000 mL | INTRAVENOUS | Status: DC | PRN

## 2020-03-14 MED ORDER — PIPERACILLIN-TAZOBACTAM IV (FOR PTA / DISCHARGE USE ONLY): 9.0000 g | INTRAVENOUS | 0 refills | Status: AC

## 2020-03-14 MED ORDER — PIPERACILLIN-TAZOBACTAM 3.375 G IVPB: 3.3750 g | Freq: Three times a day (TID) | INTRAVENOUS | Status: DC

## 2020-03-14 MED ORDER — DM-GUAIFENESIN ER 30-600 MG PO TB12: 1.0000 | ORAL_TABLET | Freq: Two times a day (BID) | ORAL | 0 refills | Status: AC

## 2020-03-14 MED ORDER — SODIUM CHLORIDE 0.9% FLUSH
10.0000 mL | Freq: Two times a day (BID) | INTRAVENOUS | Status: DC
  Administered 2020-03-14: 10 mL

## 2020-03-14 MED ORDER — PANTOPRAZOLE SODIUM 40 MG PO TBEC: 40.0000 mg | DELAYED_RELEASE_TABLET | Freq: Every day | ORAL | 0 refills | Status: DC

## 2020-03-14 NOTE — Progress Notes (Signed)
Occupational Therapy Treatment Patient Details Name: Gloria Day MRN: [id number] DOB: May 02, 1928 Today's Date: 03/14/2020    History of present illness Pt is admitted for UTI with complaints of dysuria and diarrhea x 3 days. History includes CHF, COPD, and HTN. Pt reports increased SOB and wheezing the past few days. MD assessment includes: severe sepsis due to E. coli bacteremia and UTI, leukocytosis due to suspected colitis/diverticulitis, acute on chronic diastolic CHF, acute respiratory failure with hypoxia, AKI on CKD, hypokalemia, diarrhea, and weakness.   OT comments  Pt seen for OT treatment this date to f/u re: safety with ADLs/ADL mobility. Pt seated in chair with her daughter present/visitng. Pt agreeable to getting bathed and dressed in prep for discharge that is planned for this date. Pt requires SETUP for seated UB bathing/dressing and grooming/hygiene tasks including washing face and applying deodorant. Pt requires MIN/MOD A with LB bathing for thorough completion of posterior in standing, pt able to don brief and slip on shoes with MIN A to initially thread and then pt able to perform standing clothing mgt over hips. Pt requires transfer to [facility] with RW taking 2-3 small steps with CGA initially, but demos G balance and only requires SUPV/SBA for subsequent transfers and fxl mobility. Pt left in chair at end of session with all needs met and in reach. Pt's daughter and nurse educator present completing mid-line education. Continue to anticipate that pt could benefit from [facility] f/u to ensure safety with ADLs in the natural environment.    Follow Up Recommendations  Home health OT;Supervision/Assistance - 24 hour    Equipment Recommendations  3 in 1 bedside commode    Recommendations for Other Services      Precautions / Restrictions Precautions Precautions: Fall Restrictions Weight Bearing Restrictions: No       Mobility Bed Mobility               General bed  mobility comments: pt up to chair pre/post session  Transfers Overall transfer level: Needs assistance Equipment used: Rolling walker (2 wheeled) Transfers: Sit to/from Stand;Stand Pivot Transfers Sit to Stand: Supervision;Min guard Stand pivot transfers: Min guard;Supervision       General transfer comment: Pt with good steady pace, CGA initially with progress to SBA/SUPV. Pt demos G static balance and G balance for taking steps. CGA/MIN A for rmore dynamic tasks such as LB bathing/dressing.    Balance Overall balance assessment: Needs assistance Sitting-balance support: Feet supported Sitting balance-Leahy Scale: Good     Standing balance support: During functional activity;Bilateral upper extremity supported Standing balance-Leahy Scale: Good Standing balance comment: UE support on RW                           ADL either performed or assessed with clinical judgement   ADL Overall ADL's : Needs assistance/impaired     Grooming: Wash/dry face;Oral care;Applying deodorant;Set up;Adhering to UE precautions   Upper Body Bathing: Sitting   Lower Body Bathing: Min guard;Minimal assistance;Sit to/from stand Lower Body Bathing Details (indicate cue type and reason): MIN A for throough completion of posterior LB bathing Upper Body Dressing : Set up;Sitting Upper Body Dressing Details (indicate cue type and reason): OT orients garment correctly Lower Body Dressing: Minimal assistance;Sitting/lateral leans Lower Body Dressing Details (indicate cue type and reason): to don brief and slip on shoes, just initial assist to thread items, pt able to perform clothing mgt over hips in standing with CGA/SUPV with RW  Toilet Transfer: Min Statistician Details (indicate cue type and reason): with RW for stabilizing Toileting- Clothing Manipulation and Hygiene: Minimal assistance;Sit to/from stand Toileting - Clothing Manipulation Details (indicate cue type and  reason): MIN A for thorogh completion of posterior peri care after small BM.     Functional mobility during ADLs: Min guard;Supervision/safety;Rolling walker (to take 3-4 steps to/from chair/BSC and back)       Vision Patient Visual Report: No change from baseline     Perception     Praxis      Cognition Arousal/Alertness: Awake/alert Behavior During Therapy: WFL for tasks assessed/performed Overall Cognitive Status: Within Functional Limits for tasks assessed                                          Exercises Other Exercises Other Exercises: OT engages pt in UB/LB bathing adn dressing tasks as well as STS transfers and commode transfer with RW and CGA to SUPV/SBA. Pt tolerates session well. Left in chair with personal clothing articles on and all needs met/in reach.   Shoulder Instructions       General Comments      Pertinent Vitals/ Pain       Pain Assessment: No/denies pain  Home Living                                          Prior Functioning/Environment              Frequency  Min 2X/week        Progress Toward Goals  OT Goals(current goals can now be found in the care plan section)  Progress towards OT goals: Progressing toward goals  Acute Rehab OT Goals Patient Stated Goal: to go home OT Goal Formulation: With patient Time For Goal Achievement: 03/21/20 Potential to Achieve Goals: Good  Plan Discharge plan remains appropriate;Frequency remains appropriate    Co-evaluation                 AM-PAC OT "6 Clicks" Daily Activity     Outcome Measure   Help from another person eating meals?: A Little Help from another person taking care of personal grooming?: A Little Help from another person toileting, which includes using toliet, bedpan, or urinal?: A Lot Help from another person bathing (including washing, rinsing, drying)?: A Little Help from another person to put on and taking off regular upper  body clothing?: A Little Help from another person to put on and taking off regular lower body clothing?: A Little 6 Click Score: 17    End of Session Equipment Utilized During Treatment: Rolling walker  OT Visit Diagnosis: Other abnormalities of gait and mobility (R26.89);Muscle weakness (generalized) (M62.81)   Activity Tolerance Patient tolerated treatment well   Patient Left with call bell/phone within reach;in chair;with family/visitor present (family present and nurse educator present to go over mid-line equipment.)   Nurse Communication Mobility status        Time: 3419-6222 OT Time Calculation (min): 43 min  Charges: OT General Charges $OT Visit: 1 Visit OT Treatments $Self Care/Home Management : 23-37 mins $Therapeutic Activity: 8-22 mins  Gerrianne Scale, Biehle, OTR/L ascom [phone] 03/14/20, 4:25 PM

## 2020-03-14 NOTE — Progress Notes (Signed)
[facility]  ROUNDING NOTE   Subjective:   Patient is seen while laying in bed this morning She says she feels fine today She continues to have a intermittent productive cough. She is able to eat meals without nausea She denies chest pain.  Currently on room air  Objective:  Vital signs in last 24 hours:  Temp:  [97.8 F (36.6 C)-98.6 F (37 C)] 98 F (36.7 C) (02/16 1142) Pulse Rate:  [79-91] 84 (02/16 1142) Resp:  [16-19] 18 (02/16 1142) BP: (124-148)/(55-67) 132/58 (02/16 1142) SpO2:  [91 %-97 %] 95 % (02/16 1142) Weight:  [99.1 kg] 99.1 kg (02/16 0500)  Weight change: 3.8 kg Filed Weights   03/05/20 1649 03/13/20 0500 03/14/20 0500  Weight: 95.3 kg 95.3 kg 99.1 kg    Intake/Output: I/O last 3 completed shifts: In: 220 [P.O.:120; IV Piggyback:100] Out: 700 [Urine:700]   Intake/Output this shift:  No intake/output data recorded.  Physical Exam: General: NAD, laying in bed  Head: Normocephalic, atraumatic. Moist oral mucosal membranes  Eyes: Anicteric  Neck: Supple, trachea midline  Lungs:  Crackles in the bases, wheezing, cough  Heart: Regular rate and rhythm  Abdomen:  Soft, nontender  Extremities:  minimal peripheral edema.  Neurologic: Alert and oriented   Skin: No lesions       Basic Metabolic Panel: Recent Labs  Lab 03/08/20 0705 03/09/20 0516 03/10/20 0512 03/11/20 0526 03/12/20 0421 03/13/20 0348 03/14/20 0415  NA 139 141 143 143 142 138 139  K 4.3 4.0 4.4 4.0 4.4 4.4 4.7  CL 101 104 107 108 108 107 110  CO2 '26 25 24 23 22 23 23  '$ GLUCOSE 152* 128* 129* 75 111* 110* 101*  BUN 70* 72* 74* 70* 61* 51* 42*  CREATININE 3.11* 2.90* 2.58* 2.36* 2.09* 1.94* 1.98*  CALCIUM 8.3* 8.7* 8.8* 8.7* 8.7* 8.5* 8.2*  MG 1.7 1.8  --   --   --   --   --     Liver Function Tests: No results for input(s): AST, ALT, ALKPHOS, BILITOT, PROT, ALBUMIN in the last 168 hours. No results for input(s): LIPASE, AMYLASE in the last 168 hours. No  results for input(s): AMMONIA in the last 168 hours.  CBC: Recent Labs  Lab 03/08/20 0705 03/09/20 0516 03/10/20 0512 03/11/20 0526 03/12/20 0421 03/13/20 0348 03/14/20 0415  WBC 16.5*   < > 15.5* 19.2*  19.4* 20.4* 15.9* 14.3*  NEUTROABS 14.8*  --   --  13.5* 14.9* 11.8*  --   HGB 9.8*   < > 10.9* 10.8*  10.7* 10.9* 10.7* 9.2*  HCT 30.4*   < > 32.3* 34.3*  33.6* 33.6* 31.8* 28.8*  MCV 89.7   < > 88.3 91.0  90.3 88.9 88.3 90.0  PLT 139*   < > 182 214  207 231 227 210   < > = values in this interval not displayed.    Cardiac Enzymes: No results for input(s): CKTOTAL, CKMB, CKMBINDEX, TROPONINI in the last 168 hours.  BNP: Invalid input(s): POCBNP  CBG: Recent Labs  Lab 03/10/20 0811 03/11/20 0750 03/12/20 0746 03/13/20 0757 03/14/20 0757  GLUCAP 104* 81 104* 104* 57    Microbiology: Results for orders placed or performed during the hospital encounter of 03/05/20  SARS Coronavirus 2 by RT PCR (hospital order, performed in [facility] hospital lab) Nasopharyngeal Nasopharyngeal Swab     Status: None   Collection Time: 03/05/20  6:15 PM   Specimen: Nasopharyngeal Swab  Result Value  Ref Range Status   SARS Coronavirus 2 NEGATIVE NEGATIVE Final    Comment: (NOTE) SARS-CoV-2 target nucleic acids are NOT DETECTED.  The SARS-CoV-2 RNA is generally detectable in upper and lower respiratory specimens during the acute phase of infection. The lowest concentration of SARS-CoV-2 viral copies this assay can detect is 250 copies / mL. A negative result does not preclude SARS-CoV-2 infection and should not be used as the sole basis for treatment or other patient management decisions.  A negative result may occur with improper specimen collection / handling, submission of specimen other than nasopharyngeal swab, presence of viral mutation(s) within the areas targeted by this assay, and inadequate number of viral copies (<250 copies / mL). A negative result must be combined  with clinical observations, patient history, and epidemiological information.  Fact Sheet for Patients:   StrictlyIdeas.no  Fact Sheet for Healthcare Providers: BankingDealers.co.za  This test is not yet approved or  cleared by the [place] FDA and has been authorized for detection and/or diagnosis of SARS-CoV-2 by FDA under an Emergency Use Authorization (EUA).  This EUA will remain in effect (meaning this test can be used) for the duration of the COVID-19 declaration under Section 564(b)(1) of the Act, 21 U.S.C. section 360bbb-3(b)(1), unless the authorization is terminated or revoked sooner.  Performed at [facility], [address], [place], [place] [address]   Blood Culture (routine x 2)     Status: Abnormal   Collection Time: 03/05/20  6:15 PM   Specimen: BLOOD  Result Value Ref Range Status   Specimen Description   Final    BLOOD LEFT ASSIST CONTROL Performed at [facility], [address] [place], [place] [address]    Special Requests   Final    BOTTLES DRAWN AEROBIC AND ANAEROBIC Blood Culture results may not be optimal due to an inadequate volume of blood received in culture bottles Performed at [facility], [address] [place], [place] [address]    Culture  Setup Time   Final    GRAM NEGATIVE RODS IN BOTH AEROBIC AND ANAEROBIC BOTTLES CRITICAL RESULT CALLED TO, READ BACK BY AND VERIFIED WITHLloyd Huger AT [id number] 03/06/20 SDR Performed at [facility], [place] [address] [place], [place] [address]    Culture ESCHERICHIA COLI (A)  Final   Report Status 03/08/2020 FINAL  Final   Organism ID, Bacteria ESCHERICHIA COLI  Final      Susceptibility   Escherichia coli - MIC*    AMPICILLIN >=32 RESISTANT Resistant     CEFAZOLIN <=4 SENSITIVE Sensitive     CEFEPIME <=0.12 SENSITIVE Sensitive     CEFTAZIDIME <=1 SENSITIVE Sensitive     CEFTRIAXONE <=0.25 SENSITIVE Sensitive      CIPROFLOXACIN <=0.25 SENSITIVE Sensitive     GENTAMICIN <=1 SENSITIVE Sensitive     IMIPENEM <=0.25 SENSITIVE Sensitive     TRIMETH/SULFA <=20 SENSITIVE Sensitive     AMPICILLIN/SULBACTAM >=32 RESISTANT Resistant     PIP/TAZO <=4 SENSITIVE Sensitive     * ESCHERICHIA COLI  Blood Culture (routine x 2)     Status: Abnormal   Collection Time: 03/05/20  6:15 PM   Specimen: BLOOD  Result Value Ref Range Status   Specimen Description   Final    BLOOD RIGHT ASSIST CONTROL Performed at [facility], [address], [place], [place] [address]    Special Requests   Final    BOTTLES DRAWN AEROBIC AND ANAEROBIC Blood Culture results may not  be optimal due to an inadequate volume of blood received in culture bottles Performed at [facility], [address] [place], [place] [address]    Culture  Setup Time   Final    GRAM NEGATIVE RODS IN BOTH AEROBIC AND ANAEROBIC BOTTLES CRITICAL VALUE NOTED.  VALUE IS CONSISTENT WITH PREVIOUSLY REPORTED AND CALLED VALUE. Performed at [facility], [address] [place], [place] [address]    Culture (A)  Final    ESCHERICHIA COLI SUSCEPTIBILITIES PERFORMED ON PREVIOUS CULTURE WITHIN THE LAST 5 DAYS. Performed at [facility], [place] [address], [place], [place] [address]    Report Status 03/08/2020 FINAL  Final  Blood Culture ID Panel (Reflexed)     Status: Abnormal   Collection Time: 03/05/20  6:15 PM  Result Value Ref Range Status   Enterococcus faecalis NOT DETECTED NOT DETECTED Final   Enterococcus Faecium NOT DETECTED NOT DETECTED Final   Listeria monocytogenes NOT DETECTED NOT DETECTED Final   Staphylococcus species NOT DETECTED NOT DETECTED Final   Staphylococcus aureus (BCID) NOT DETECTED NOT DETECTED Final   Staphylococcus epidermidis NOT DETECTED NOT DETECTED Final   Staphylococcus lugdunensis NOT DETECTED NOT DETECTED Final   Streptococcus species NOT DETECTED NOT DETECTED Final   Streptococcus agalactiae  NOT DETECTED NOT DETECTED Final   Streptococcus pneumoniae NOT DETECTED NOT DETECTED Final   Streptococcus pyogenes NOT DETECTED NOT DETECTED Final   A.calcoaceticus-baumannii NOT DETECTED NOT DETECTED Final   Bacteroides fragilis NOT DETECTED NOT DETECTED Final   Enterobacterales DETECTED (A) NOT DETECTED Final    Comment: Enterobacterales represent a large order of gram negative bacteria, not a single organism. CRITICAL RESULT CALLED TO, READ BACK BY AND VERIFIED WITH:  NATHAN BELUE AT [id number] 03/06/20 SDR    Enterobacter cloacae complex NOT DETECTED NOT DETECTED Final   Escherichia coli DETECTED (A) NOT DETECTED Final    Comment: CRITICAL RESULT CALLED TO, READ BACK BY AND VERIFIED WITH:  NATHAN BELUE AT [id number] 03/06/20 SDR    Klebsiella aerogenes NOT DETECTED NOT DETECTED Final   Klebsiella oxytoca NOT DETECTED NOT DETECTED Final   Klebsiella pneumoniae NOT DETECTED NOT DETECTED Final   Proteus species NOT DETECTED NOT DETECTED Final   Salmonella species NOT DETECTED NOT DETECTED Final   Serratia marcescens NOT DETECTED NOT DETECTED Final   Haemophilus influenzae NOT DETECTED NOT DETECTED Final   Neisseria meningitidis NOT DETECTED NOT DETECTED Final   Pseudomonas aeruginosa NOT DETECTED NOT DETECTED Final   Stenotrophomonas maltophilia NOT DETECTED NOT DETECTED Final   Candida albicans NOT DETECTED NOT DETECTED Final   Candida auris NOT DETECTED NOT DETECTED Final   Candida glabrata NOT DETECTED NOT DETECTED Final   Candida krusei NOT DETECTED NOT DETECTED Final   Candida parapsilosis NOT DETECTED NOT DETECTED Final   Candida tropicalis NOT DETECTED NOT DETECTED Final   Cryptococcus neoformans/gattii NOT DETECTED NOT DETECTED Final   CTX-M ESBL NOT DETECTED NOT DETECTED Final   Carbapenem resistance IMP NOT DETECTED NOT DETECTED Final   Carbapenem resistance KPC NOT DETECTED NOT DETECTED Final   Carbapenem resistance NDM NOT DETECTED NOT DETECTED Final   Carbapenem resist OXA 48 LIKE  NOT DETECTED NOT DETECTED Final   Carbapenem resistance VIM NOT DETECTED NOT DETECTED Final    Comment: Performed at [facility], [address] [place], [place] [address]  Culture, blood (single) w Reflex to ID Panel     Status: None (Preliminary result)   Collection Time: 03/10/20  3:44 PM  Specimen: BLOOD  Result Value Ref Range Status   Specimen Description BLOOD BLOOD LEFT HAND  Final   Special Requests   Final    BOTTLES DRAWN AEROBIC AND ANAEROBIC Blood Culture adequate volume   Culture   Final    NO GROWTH 4 DAYS Performed at [facility], [address] [place], [place] [address]    Report Status PENDING  Incomplete  SARS CORONAVIRUS 2 (TAT 6-24 HRS) Nasopharyngeal Nasopharyngeal Swab     Status: None   Collection Time: 03/11/20 11:28 AM   Specimen: Nasopharyngeal Swab  Result Value Ref Range Status   SARS Coronavirus 2 NEGATIVE NEGATIVE Final    Comment: (NOTE) SARS-CoV-2 target nucleic acids are NOT DETECTED.  The SARS-CoV-2 RNA is generally detectable in upper and lower respiratory specimens during the acute phase of infection. Negative results do not preclude SARS-CoV-2 infection, do not rule out co-infections with other pathogens, and should not be used as the sole basis for treatment or other patient management decisions. Negative results must be combined with clinical observations, patient history, and epidemiological information. The expected result is Negative.  Fact Sheet for Patients: SugarRoll.be  Fact Sheet for Healthcare Providers: https://www.woods-mathews.com/  This test is not yet approved or cleared by the [place] FDA and  has been authorized for detection and/or diagnosis of SARS-CoV-2 by FDA under an Emergency Use Authorization (EUA). This EUA will remain  in effect (meaning this test can be used) for the duration of the COVID-19 declaration under Se ction 564(b)(1) of the Act,  21 U.S.C. section 360bbb-3(b)(1), unless the authorization is terminated or revoked sooner.  Performed at [facility], [place] [address], [place], [place] [address]   Group A Strep by PCR     Status: None   Collection Time: 03/12/20  1:09 PM   Specimen: Throat; Sterile Swab  Result Value Ref Range Status   Group A Strep by PCR NOT DETECTED NOT DETECTED Final    Comment: Performed at [facility], [address] [place], [place] [address]  Respiratory (~20 pathogens) panel by PCR     Status: None   Collection Time: 03/12/20  1:09 PM   Specimen: Nasopharyngeal Swab; Respiratory  Result Value Ref Range Status   Adenovirus NOT DETECTED NOT DETECTED Final   Coronavirus 229E NOT DETECTED NOT DETECTED Final    Comment: (NOTE) The Coronavirus on the Respiratory Panel, DOES NOT test for the novel  Coronavirus (2019 nCoV)    Coronavirus HKU1 NOT DETECTED NOT DETECTED Final   Coronavirus NL63 NOT DETECTED NOT DETECTED Final   Coronavirus OC43 NOT DETECTED NOT DETECTED Final   Metapneumovirus NOT DETECTED NOT DETECTED Final   Rhinovirus / Enterovirus NOT DETECTED NOT DETECTED Final   Influenza A NOT DETECTED NOT DETECTED Final   Influenza B NOT DETECTED NOT DETECTED Final   Parainfluenza Virus 1 NOT DETECTED NOT DETECTED Final   Parainfluenza Virus 2 NOT DETECTED NOT DETECTED Final   Parainfluenza Virus 3 NOT DETECTED NOT DETECTED Final   Parainfluenza Virus 4 NOT DETECTED NOT DETECTED Final   Respiratory Syncytial Virus NOT DETECTED NOT DETECTED Final   Bordetella pertussis NOT DETECTED NOT DETECTED Final   Bordetella Parapertussis NOT DETECTED NOT DETECTED Final   Chlamydophila pneumoniae NOT DETECTED NOT DETECTED Final   Mycoplasma pneumoniae NOT DETECTED NOT DETECTED Final    Comment: Performed at [facility], [place]. [address], [place], [place] [address]    Coagulation Studies: No results for input(s):  LABPROT, INR in the last 72 hours.  Urinalysis: No  results for input(s): COLORURINE, LABSPEC, PHURINE, GLUCOSEU, HGBUR, BILIRUBINUR, KETONESUR, PROTEINUR, UROBILINOGEN, NITRITE, LEUKOCYTESUR in the last 72 hours.  Invalid input(s): APPERANCEUR    Imaging: CT ABDOMEN PELVIS WO CONTRAST  Result Date: 03/12/2020 CLINICAL DATA:  Worsening nausea, vomiting and diarrhea, dysuria EXAM: CT ABDOMEN AND PELVIS WITHOUT CONTRAST TECHNIQUE: Multidetector CT imaging of the abdomen and pelvis was performed following the standard protocol without IV contrast. COMPARISON:  03/08/2020, 03/05/2020 FINDINGS: Lower chest: No acute pleural or parenchymal lung disease. Unenhanced CT was performed per clinician order. Lack of IV contrast limits sensitivity and specificity, especially for evaluation of abdominal/pelvic solid viscera. Hepatobiliary: No focal liver abnormality is seen. Status post cholecystectomy. No biliary dilatation. Pancreas: Unremarkable. No pancreatic ductal dilatation or surrounding inflammatory changes. Spleen: Normal in size without focal abnormality. Adrenals/Urinary Tract: Stable bilateral adrenal glands. Exophytic right renal cyst unchanged. No urinary tract calculi or obstructive uropathy. Bladder is unremarkable. Stomach/Bowel: No bowel obstruction or ileus. Diverticulosis of the sigmoid colon without evidence of acute diverticulitis. Chronic wall thickening of the proximal sigmoid colon likely reflects postinflammatory scarring. No signs of active inflammation. Vascular/Lymphatic: Aortic atherosclerosis. No enlarged abdominal or pelvic lymph nodes. Reproductive: Status post hysterectomy. No adnexal masses. Other: No free fluid or free gas.  No abdominal wall hernia. Musculoskeletal: No acute or destructive bony lesions. Mild subcutaneous edema in the bilateral flanks. Reconstructed images demonstrate no additional findings. IMPRESSION: 1. No urinary tract calculi or obstructive uropathy. 2. Sigmoid diverticulosis without diverticulitis. Likely  postinflammatory scarring of the sigmoid colon unchanged. 3.  Emphysema (ICD10-J43.9). Electronically Signed   By: Randa Ngo M.D.   On: 03/12/2020 23:08   [place] EKG SITE RITE  Result Date: 03/13/2020 If Site Rite image not attached, placement could not be confirmed due to current cardiac rhythm.    Medications:   . sodium chloride 1,000 mL (03/11/20 1235)  . piperacillin-tazobactam (ZOSYN)  IV 3.375 g (03/14/20 0520)   . allopurinol  100 mg Oral Daily  . atorvastatin  20 mg Oral QPM  . budesonide (PULMICORT) nebulizer solution  0.5 mg Nebulization BID  . calcitRIOL  0.25 mcg Oral Daily  . dextromethorphan-guaiFENesin  1 tablet Oral BID  . diltiazem  240 mg Oral Daily  . fluticasone  2 spray Each Nare Daily  . heparin  5,000 Units Subcutaneous Q8H  . ipratropium-albuterol  3 mL Nebulization TID  . loratadine  10 mg Oral Daily  . multivitamin-lutein  1 capsule Oral Daily  . pantoprazole  40 mg Oral Daily  . sodium chloride flush  10-40 mL Intracatheter Q12H   sodium chloride, acetaminophen **OR** acetaminophen, albuterol, guaiFENesin-dextromethorphan, hydrALAZINE, lip balm, menthol-cetylpyridinium, ondansetron **OR** ondansetron (ZOFRAN) IV, phenol, sodium chloride flush  Assessment/ Plan:  Ms. Gloria Day is a 87 y.o.  female with PMHX significant of COPD, CHF per, HTN, CKD stage 4, gout, and anemia of chronic disease. She presents to the ED with c/o nausea, vomiting, diarrhea, and dysuria for 2-3 days.   1.Acute kidney injury in the setting of chronic kidney disease stage 4 Likely caused by dehydration prior to admission Baseline creatinine of 1.8 in Nov 2021.  Creatinine continues to improve -Able to d/c -Will follow-up with nephrology office in 1 week  2. Hypertension  - Blood pressure continues to be elevated -currently prescribed Cardizem -history positive for HTN   2. UTI with sepsis Completed Keflex treatment  Currently on Zosyn   3.  Gastroenteritis Denies  diarrhea, nausea and vomiting   LOS: 9 Sherma Vanmetre,NP 2/16/202212:19 PM

## 2020-03-14 NOTE — Discharge Summary (Signed)
Gloria Day UJW:[id number] DOB: 09-Feb-1928 DOA: 03/05/2020  PCP: Sofie Hartigan, MD  Admit date: 03/05/2020 Discharge date: 03/14/2020  Admitted From: Home Disposition: Home  Recommendations for Outpatient Follow-up:  1. Follow up with PCP in 1 week 2. Please obtain BMP/CBC in one week 3. Nephrology Dr. Juleen China in 1 week  Home Health: Yes   Discharge Condition:Stable CODE STATUS: DNR Diet recommendation: Heart Healthy   Brief/Interim Summary: Per HPI: Gloria Day is a 87 y.o. female with medical history significant of  CHF Pef, COPD not on home O2 , HTN,CKDIV, gout, Anemia of chronic disease, who presented to UC with complaints of n/v/d/dysuria x 2-3 days with symptoms that are progressive. Per UC notes patient on admit was noted to be dyspneic/tacycpneic w/o hypoxemia. She was noted to also have dried stool on legs due to persistent diarrhea.  ON evaluation patient was noted to have soft bp of 90/60, labs notable for wbc 24/+UA/AKI on CKDIIIa. Due to these findings patient was referred to ed. S/p treatment with 1/2L N/s .Patient reported since ill with uti she has also note increase sob from baseline, she noted that she was on controller medications for COPD but felt they were not effective and decide to discontinue these medications.She noted no chest pain but note chronic RUQ pain she notes she has had for years. She denies any significant cough, she denies any blood in her stools. On presentation she was hypotensive with bp 90/60 and RR 28 with o2 sat of 94%. Covid was negative.  Her labs revealed positive UA and worsening renal function and leukocytosis.  She was admitted to the hospital service.  Severe sepsis due to E. coli bacteremia and UTI -presented with severe sepsis as evidenced by leukocytosis, tachycardia, fever in the setting of UTI.  AKI superimposed on CKD stage IV reflects organ dysfunction consistent with severe sepsis.  Sepsis physiology improved. Treated with  abx. repeat cultures - neg to date   Leukocytosis due to suspected colitis/diverticulitis -WBC was rising peaked at [facility].   Improved to 14.3K today,, after Zosyn initiated  --Retested Covid: negative --Completed short course of Zithromax for ?bronchitis --Follow up pending blood culture - neg to date -ID consulted, and recommended zosyn for suspected diverticulitits- see CT below --Continue Zosyn for suspected colitis/diverticulitis -has responded well PICC/midline placed for home infusion ID placed order .   Cough - suspect UACS (upper airway cough syndrome) due to allergic rhinitis and post-nasal drip +/- ?volume overload.  Repeat CXR stable.  Cough seems improved with trial of Mucinex, loratadine and Flonase.  Completed Zithromax for suspected bronchitis.  Acute on chronic diastolic CHF   Patient appears euvolemic on exam.   Echo - EF 70-75% hyperdynamic LVEF systolic function, grade 1 diastolic dysfunction.   Acute respiratory failure with hypoxia - Resolved.  Pt not on supplemental oxygen at baseline, needed 2 L/min along with subjective shortness of breath.  Weaned off. Pt has elevated R hemidiaphragm, and not mobilizing therefore atelectasis contributes to hypoxia.  No sign of PNA on CXR. Repeat chest xrays have been stable without signs PNA  COPD with mild acute exacerbation - had mild wheezing 2/8, treated with IV steroid, exacerbation resolved.  Per chart review, appears pt used to be prescribed inhalers but stopped them on her own as she did not feel they were helpful.  Pt has been prescribed inhalers in past, did not find them very helpful.   F/u with pcp for further mx  AKI superimposed on  CKD stage IV -improved with IVF> Nephrology was consulted.  Lasix was held.  Creatinine improving today 1.98 Discussed with Dr. Juleen China, okay for discharge from nephrology standpoint can follow-up Dr. Juleen China as outpatient  Hypokalemia -potassium was replaced.  Stable  Diarrhea  -present on admission, unclear etiology.  Resolved and later constipated. Patient denies further episodes of diarrhea since admission.    Generalized weakness-evaluated by physical therapy and home health PT with 24-hour supervision was recommended.  Pt lives with daughter, will have 24/7 care.   Essential hypertension -improved Continue Cardizem  Hold Lasix     Hyperlipidemia -continue Zocor  Obesity: Body mass index is 36.06 kg/m.  Complicates overall care and prognosis.  Recommend physical activity and diet efforts towards weight loss.  Primary care follow-up.    Discharge Diagnoses:  Active Problems:   UTI (urinary tract infection)   Sepsis due to Escherichia coli with acute renal failure without septic shock (HCC)   Acute diastolic CHF (congestive heart failure) ([place])   Acute kidney injury superimposed on CKD ([place])   Hypokalemia   Essential hypertension   Hyperlipidemia    Discharge Instructions  Discharge Instructions    [facility] pharmacist to adjust dose for Vancomycin, Aminoglycosides and other anti-infective therapies as requested by physician.   Complete by: As directed    [facility] to provide Cath Flo 52m   Complete by: As directed    Administer for PICC line occlusion and as ordered by physician for other access device issues.   Anaphylaxis Kit: Provided to treat any anaphylactic reaction to the medication being provided to the patient if First Dose or when requested by physician   Complete by: As directed    Epinephrine 152mml vial / amp: Administer 0.5m59m0.5ml28mubcutaneously once for moderate to severe anaphylaxis, nurse to call physician and pharmacy when reaction occurs and call 911 if needed for immediate care   Diphenhydramine 50mg58mIV vial: Administer 25-50mg 68mM PRN for first dose reaction, rash, itching, mild reaction, nurse to call physician and pharmacy when reaction occurs   Sodium Chloride 0.9% NS 500ml I35mAdminister if needed for hypovolemic blood pressure drop or as ordered by physician after call to physician with anaphylactic reaction   Change dressing on IV access line weekly and PRN   Complete by: As directed    Flush IV access with Sodium Chloride 0.9% and Heparin 10 units/ml or 100 units/ml   Complete by: As directed    Home infusion instructions - [facility]   Complete by: As directed    Instructions: Flush IV access with Sodium Chloride 0.9% and Heparin 10units/ml or 100units/ml   Change dressing on IV access line: Weekly and PRN   Instructions Cath Flo 2mg: Ad95mister for PICC Line occlusion and as ordered by physician for other access device   [facility] pharmacist to adjust dose for: Vancomycin, Aminoglycosides and other anti-infective therapies as requested by physician   Method of administration may be changed at the discretion of home infusion pharmacist based upon assessment of the patient and/or caregiver's ability to self-administer the medication ordered   Complete by: As directed      Allergies as of 03/14/2020   No Known Allergies     Medication List    STOP taking these medications   furosemide 40 MG tablet Commonly known as: LASIX   RABEprazole 20 MG tablet Commonly known as: ACIPHEX Replaced by: pantoprazole 40 MG tablet     TAKE these  medications   allopurinol 100 MG tablet Commonly known as: ZYLOPRIM Take 100 mg by mouth daily.   calcitRIOL 0.25 MCG capsule Commonly known as: ROCALTROL Take 0.25 mcg by mouth daily.   dextromethorphan-guaiFENesin 30-600 MG 12hr tablet Commonly known as: MUCINEX DM Take 1 tablet by mouth 2 (two) times daily for 3 days.   diltiazem 240 MG 24 hr capsule Commonly known as: CARDIZEM CD Take 1 capsule (240 mg total) by mouth daily. Start taking on: March 15, 2020 What changed:   medication strength  how much to take   pantoprazole 40 MG tablet Commonly known as: PROTONIX Take 1 tablet  (40 mg total) by mouth daily. Start taking on: March 15, 2020 Replaces: RABEprazole 20 MG tablet   piperacillin-tazobactam  IVPB Commonly known as: ZOSYN Inject 9 g into the vein continuous for 7 days. Infuse piperacillin/tazobactam 9 gm daily as continuous infusion  Indication: diverticulitis First Dose: Yes Last Day of Therapy:  03/21/2020 Labs - Once weekly (Monday):  CBC/D and CMP, Pull PICC/midline upon completion of antibiotics Method of administration: Elastomeric (Continuous infusion) Method of administration may be changed at the discretion of home infusion pharmacist based upon assessment of the patient and/or caregiver's ability to self-administer the medication ordered.   PreserVision AREDS 2+Multi Vit Caps Take 1 capsule by mouth 2 (two) times daily.   simvastatin 40 MG tablet Commonly known as: ZOCOR Take 40 mg by mouth daily at 6 PM.   vitamin B-12 1000 MCG tablet Commonly known as: CYANOCOBALAMIN Take 1,000 mcg by mouth daily.            Discharge Care Instructions  (From admission, onward)         Start     Ordered   03/14/20 0000  Change dressing on IV access line weekly and PRN  (Home infusion instructions - [facility] )        03/14/20 1244          Follow-up Information    Sofie Hartigan, MD Follow up in 1 week(s).   Specialty: Family Medicine Contact information: [address] [place] [address] [phone]        Lavonia Dana, MD Follow up in 1 week(s).   Specialty: Nephrology Contact information: [address] [place] [address] [phone]              No Known Allergies  Consultations:  ID   Procedures/Studies: CT ABDOMEN PELVIS WO CONTRAST  Result Date: 03/12/2020 CLINICAL DATA:  Worsening nausea, vomiting and diarrhea, dysuria EXAM: CT ABDOMEN AND PELVIS WITHOUT CONTRAST TECHNIQUE: Multidetector CT imaging of the abdomen and pelvis was performed following the  standard protocol without IV contrast. COMPARISON:  03/08/2020, 03/05/2020 FINDINGS: Lower chest: No acute pleural or parenchymal lung disease. Unenhanced CT was performed per clinician order. Lack of IV contrast limits sensitivity and specificity, especially for evaluation of abdominal/pelvic solid viscera. Hepatobiliary: No focal liver abnormality is seen. Status post cholecystectomy. No biliary dilatation. Pancreas: Unremarkable. No pancreatic ductal dilatation or surrounding inflammatory changes. Spleen: Normal in size without focal abnormality. Adrenals/Urinary Tract: Stable bilateral adrenal glands. Exophytic right renal cyst unchanged. No urinary tract calculi or obstructive uropathy. Bladder is unremarkable. Stomach/Bowel: No bowel obstruction or ileus. Diverticulosis of the sigmoid colon without evidence of acute diverticulitis. Chronic wall thickening of the proximal sigmoid colon likely reflects postinflammatory scarring. No signs of active inflammation. Vascular/Lymphatic: Aortic atherosclerosis. No enlarged abdominal or pelvic lymph nodes. Reproductive: Status post hysterectomy.  No adnexal masses. Other: No free fluid or free gas.  No abdominal wall hernia. Musculoskeletal: No acute or destructive bony lesions. Mild subcutaneous edema in the bilateral flanks. Reconstructed images demonstrate no additional findings. IMPRESSION: 1. No urinary tract calculi or obstructive uropathy. 2. Sigmoid diverticulosis without diverticulitis. Likely postinflammatory scarring of the sigmoid colon unchanged. 3.  Emphysema (ICD10-J43.9). Electronically Signed   By: Randa Ngo M.D.   On: 03/12/2020 23:08   CT ABDOMEN PELVIS WO CONTRAST  Result Date: 03/05/2020 CLINICAL DATA:  Nausea/vomiting/diarrhea, pain for several days EXAM: CT ABDOMEN AND PELVIS WITHOUT CONTRAST TECHNIQUE: Multidetector CT imaging of the abdomen and pelvis was performed following the standard protocol without IV contrast. COMPARISON:   07/27/2017, 07/19/2019 FINDINGS: Lower chest: No acute pleural or parenchymal lung disease. Unenhanced CT was performed per clinician order. Lack of IV contrast limits sensitivity and specificity, especially for evaluation of abdominal/pelvic solid viscera. Hepatobiliary: No focal liver abnormality is seen. Status post cholecystectomy. No biliary dilatation. Pancreas: Unremarkable. No pancreatic ductal dilatation or surrounding inflammatory changes. Known pancreatic cysts, largest in the uncinate process, seen on previous studies are not well visualized on this unenhanced exam. Spleen: Normal in size without focal abnormality. Adrenals/Urinary Tract: Stable bilateral adrenal glands. Exophytic cyst right kidney unchanged. No urinary tract calculi or obstructive uropathy within either kidney. The bladder is decompressed, which limits its evaluation. Stomach/Bowel: No bowel obstruction or ileus. There is diverticulosis of the descending and sigmoid colon. Wall thickening of the mid sigmoid colon likely reflects postinflammatory scarring. I do not see any signs of active inflammation on this exam. Vascular/Lymphatic: Stable atherosclerosis of the abdominal aorta and its branches. Multiple borderline enlarged retroperitoneal lymph nodes are unchanged and likely reactive. Largest measures 10 mm in the aortocaval region. Reproductive: Status post hysterectomy. No adnexal masses. Other: No free fluid or free gas. Small fat containing umbilical hernia. Musculoskeletal: No acute or destructive bony lesions. Reconstructed images demonstrate no additional findings. IMPRESSION: 1. No urinary tract calculi or obstructive uropathy. 2. Diverticulosis of the descending and sigmoid colon. Wall thickening of the mid sigmoid colon likely reflects postinflammatory scarring. I do not see any signs of active inflammation on this exam. 3. Known pancreatic cysts, largest in the uncinate process, seen on previous studies are not well  visualized on this unenhanced exam. Please refer to MRI discussion 07/19/2019 which recommended 2 year follow-up MRI. 4. Aortic Atherosclerosis (ICD10-I70.0). Electronically Signed   By: Randa Ngo M.D.   On: 03/05/2020 17:47   DG Chest 2 View  Result Date: 03/05/2020 CLINICAL DATA:  Short of breath, wheezing EXAM: CHEST - 2 VIEW COMPARISON:  04/29/2013 FINDINGS: Frontal and lateral views of the chest demonstrate a stable cardiac silhouette. Chronic elevation the right hemidiaphragm. No airspace disease, effusion, or pneumothorax. No acute bony abnormalities. IMPRESSION: 1. No acute intrathoracic process. 2. Chronic elevation of the right hemidiaphragm. Electronically Signed   By: Randa Ngo M.D.   On: 03/05/2020 15:35   US RENAL  Result Date: 03/08/2020 CLINICAL DATA:  Kidney injury. EXAM: RENAL / URINARY TRACT ULTRASOUND COMPLETE COMPARISON:  CT abdomen and pelvis 03/05/2020 FINDINGS: Right Kidney: Renal measurements: 10.3 x 5.4 x 4.5 cm = volume: 131 mL. Cortical thinning. Mildly increased parenchymal echogenicity. No hydronephrosis. 5.1 cm interpolar cyst and 1.1 cm lower pole cyst. Left Kidney: Renal measurements: 10.6 x 5.3 x 3.8 cm = volume: 112 mL. Cortical thinning. Mildly increased parenchymal echogenicity. No mass or hydronephrosis visualized. Bladder: Appears normal for degree of bladder distention. Other:  None. IMPRESSION: 1. Bilateral renal cortical thinning and increased parenchymal echogenicity compatible with medical renal disease. No hydronephrosis. 2. Right renal cysts. Electronically Signed   By: Logan Bores M.D.   On: 03/08/2020 10:18   DG Chest Port 1 View  Result Date: 03/10/2020 CLINICAL DATA:  CHF, cough, COPD EXAM: PORTABLE CHEST 1 VIEW COMPARISON:  03/08/2020 chest radiograph. FINDINGS: Stable cardiomediastinal silhouette with top-normal heart size. No pneumothorax. Stable moderate elevation of the right hemidiaphragm. Chronic right basilar moderate atelectasis. No  overt pulmonary edema. No acute consolidative airspace disease. IMPRESSION: Chronic moderate elevation of the right hemidiaphragm with right basilar atelectasis. No acute cardiopulmonary disease. Electronically Signed   By: Ilona Sorrel M.D.   On: 03/10/2020 14:10   DG Chest Port 1 View  Result Date: 03/08/2020 CLINICAL DATA:  Hypoxia EXAM: PORTABLE CHEST 1 VIEW COMPARISON:  03/05/2020, 04/29/2013 FINDINGS: There is marked elevation of the right hemidiaphragm, slightly progressive since prior examination which may relate to the patient's position (sitting) on the current examination. No superimposed focal pulmonary infiltrate. No pneumothorax or pleural effusion. Cardiac size is at the upper limits of normal, unchanged. Pulmonary vascularity is normal. No acute bone abnormality. IMPRESSION: Stable elevation of the right hemidiaphragm. Stable borderline cardiomegaly. Electronically Signed   By: Fidela Salisbury MD   On: 03/08/2020 10:41   DG Chest Portable 1 View  Result Date: 03/05/2020 CLINICAL DATA:  Shortness of breath EXAM: PORTABLE CHEST 1 VIEW COMPARISON:  March 05, 2020 FINDINGS: The heart size and mediastinal contours are unchanged with mild cardiomegaly. There is elevation of the right hemidiaphragm. There is prominence of the central pulmonary vasculature. No pleural effusion. No acute osseous abnormality. IMPRESSION: Mild pulmonary vascular congestion. Electronically Signed   By: Prudencio Pair M.D.   On: 03/05/2020 21:43   ECHOCARDIOGRAM COMPLETE  Result Date: 03/08/2020    ECHOCARDIOGRAM REPORT   Patient Name:   LEILANI CESPEDES [facility] Date of Exam: 03/07/2020 Medical Rec #:  [id number]     Height:       64.0 in Accession #:    [id number]    Weight:       210.0 lb Date of Birth:  Nov 12, 1928     BSA:          1.997 m Patient Age:    35 years      BP:           104/56 mmHg Patient Gender: F             HR:           102 bpm. Exam Location:  [facility] Procedure: 2D Echo, Cardiac Doppler and Color Doppler  Indications:     CHF- acute diastolic B01.58  History:         Patient has no prior history of Echocardiogram examinations.                  CHF, COPD; Risk Factors:Hypertension.  Sonographer:     Sherrie Sport RDCS (AE) Referring Phys:  [id number] Loletha Grayer Diagnosing Phys: Yolonda Kida MD IMPRESSIONS  1. Hyperdynamic LVF EF=70-75%.  2. Left ventricular ejection fraction, by estimation, is 70 to 75%. The left ventricle has hyperdynamic function. The left ventricle has no regional wall motion abnormalities. Left ventricular diastolic parameters are consistent with Grade I diastolic dysfunction (impaired relaxation).  3. Right ventricular systolic function is normal. The right ventricular size is normal.  4. The mitral valve is normal in structure. No evidence of mitral valve  regurgitation.  5. The aortic valve is grossly normal. Aortic valve regurgitation is not visualized. FINDINGS  Left Ventricle: Left ventricular ejection fraction, by estimation, is 70 to 75%. The left ventricle has hyperdynamic function. The left ventricle has no regional wall motion abnormalities. The left ventricular internal cavity size was normal in size. There is no left ventricular hypertrophy. Left ventricular diastolic parameters are consistent with Grade I diastolic dysfunction (impaired relaxation). Right Ventricle: The right ventricular size is normal. No increase in right ventricular wall thickness. Right ventricular systolic function is normal. Left Atrium: Left atrial size was normal in size. Right Atrium: Right atrial size was normal in size. Pericardium: There is no evidence of pericardial effusion. Mitral Valve: The mitral valve is normal in structure. No evidence of mitral valve regurgitation. Tricuspid Valve: The tricuspid valve is normal in structure. Tricuspid valve regurgitation is trivial. Aortic Valve: The aortic valve is grossly normal. Aortic valve regurgitation is not visualized. Aortic valve mean gradient measures  5.0 mmHg. Aortic valve peak gradient measures 8.4 mmHg. Aortic valve area, by VTI measures 2.90 cm. Pulmonic Valve: The pulmonic valve was normal in structure. Pulmonic valve regurgitation is not visualized. Aorta: The ascending aorta was not well visualized. IAS/Shunts: No atrial level shunt detected by color flow Doppler. Additional Comments: Hyperdynamic LVF EF=70-75%.  LEFT VENTRICLE PLAX 2D LVIDd:         4.67 cm  Diastology LVIDs:         2.37 cm  LV e' medial:    4.79 cm/s LV PW:         1.35 cm  LV E/e' medial:  18.6 LV IVS:        0.97 cm  LV e' lateral:   3.92 cm/s LVOT diam:     2.00 cm  LV E/e' lateral: 22.7 LV SV:         74 LV SV Index:   37 LVOT Area:     3.14 cm  RIGHT VENTRICLE RV Basal diam:  3.43 cm RV S prime:     10.20 cm/s TAPSE (M-mode): 3.0 cm LEFT ATRIUM           Index       RIGHT ATRIUM           Index LA diam:      4.50 cm 2.25 cm/m  RA Area:     10.50 cm LA Vol (A2C): 42.4 ml 21.23 ml/m RA Volume:   20.40 ml  10.21 ml/m LA Vol (A4C): 55.7 ml 27.89 ml/m  AORTIC VALVE                   PULMONIC VALVE AV Area (Vmax):    2.25 cm    PV Vmax:        0.90 m/s AV Area (Vmean):   2.33 cm    PV Peak grad:   3.2 mmHg AV Area (VTI):     2.90 cm    RVOT Peak grad: 5 mmHg AV Vmax:           145.00 cm/s AV Vmean:          99.000 cm/s AV VTI:            0.255 m AV Peak Grad:      8.4 mmHg AV Mean Grad:      5.0 mmHg LVOT Vmax:         104.00 cm/s LVOT Vmean:        73.500 cm/s LVOT VTI:  0.235 m LVOT/AV VTI ratio: 0.92  AORTA Ao Root diam: 2.80 cm MITRAL VALVE                TRICUSPID VALVE MV Area (PHT): 4.49 cm     TR Peak grad:   40.7 mmHg MV Decel Time: 169 msec     TR Vmax:        319.00 cm/s MV E velocity: 89.10 cm/s MV A velocity: 102.00 cm/s  SHUNTS MV E/A ratio:  0.87         Systemic VTI:  0.24 m                             Systemic Diam: 2.00 cm Yolonda Kida MD Electronically signed by Yolonda Kida MD Signature Date/Time: 03/08/2020/7:56:13 AM    Final    [place] EKG  SITE RITE  Result Date: 03/13/2020 If Site Rite image not attached, placement could not be confirmed due to current cardiac rhythm.     Subjective: Has no complaints. Feels better  Discharge Exam: Vitals:   03/14/20 0811 03/14/20 1142  BP:  (!) 132/58  Pulse:  84  Resp:  18  Temp:  98 F (36.7 C)  SpO2: 96% 95%   Vitals:   03/14/20 0539 03/14/20 0756 03/14/20 0811 03/14/20 1142  BP: 140/64 (!) 148/56  (!) 132/58  Pulse: 85 89  84  Resp: '17 16  18  ' Temp: 98.2 F (36.8 C) 98 F (36.7 C)  98 F (36.7 C)  TempSrc: Oral Oral    SpO2: 91% 96% 96% 95%  Weight:      Height:        General: Pt is alert, awake, not in acute distress Cardiovascular: RRR, S1/S2 +, no rubs, no gallops Respiratory: CTA bilaterally, no wheezing, no rhonchi Abdominal: Soft, NT, ND, bowel sounds + Extremities: no edema    The results of significant diagnostics from this hospitalization (including imaging, microbiology, ancillary and laboratory) are listed below for reference.     Microbiology: Recent Results (from the past 240 hour(s))  Urine culture     Status: Abnormal   Collection Time: 03/05/20  2:09 PM   Specimen: Urine, Clean Catch  Result Value Ref Range Status   Specimen Description   Final    URINE, CLEAN CATCH Performed at [facility], [address] [place], [place] [address]    Special Requests   Final    NONE Performed at [facility], [place] [address], [place], [place] [address]    Culture >=100,000 COLONIES/mL ESCHERICHIA COLI (A)  Final   Report Status 03/08/2020 FINAL  Final   Organism ID, Bacteria ESCHERICHIA COLI (A)  Final      Susceptibility   Escherichia coli - MIC*    AMPICILLIN >=32 RESISTANT Resistant     CEFAZOLIN <=4 SENSITIVE Sensitive     CEFEPIME <=0.12 SENSITIVE Sensitive     CEFTRIAXONE <=0.25 SENSITIVE Sensitive     CIPROFLOXACIN <=0.25 SENSITIVE Sensitive     GENTAMICIN <=1 SENSITIVE Sensitive     IMIPENEM <=0.25 SENSITIVE  Sensitive     NITROFURANTOIN <=16 SENSITIVE Sensitive     TRIMETH/SULFA <=20 SENSITIVE Sensitive     AMPICILLIN/SULBACTAM >=32 RESISTANT Resistant     PIP/TAZO <=4 SENSITIVE Sensitive     * >=100,000 COLONIES/mL ESCHERICHIA COLI  SARS Coronavirus 2 by RT PCR (hospital order, performed in [facility] hospital lab) Nasopharyngeal Nasopharyngeal Swab  Status: None   Collection Time: 03/05/20  6:15 PM   Specimen: Nasopharyngeal Swab  Result Value Ref Range Status   SARS Coronavirus 2 NEGATIVE NEGATIVE Final    Comment: (NOTE) SARS-CoV-2 target nucleic acids are NOT DETECTED.  The SARS-CoV-2 RNA is generally detectable in upper and lower respiratory specimens during the acute phase of infection. The lowest concentration of SARS-CoV-2 viral copies this assay can detect is 250 copies / mL. A negative result does not preclude SARS-CoV-2 infection and should not be used as the sole basis for treatment or other patient management decisions.  A negative result may occur with improper specimen collection / handling, submission of specimen other than nasopharyngeal swab, presence of viral mutation(s) within the areas targeted by this assay, and inadequate number of viral copies (<250 copies / mL). A negative result must be combined with clinical observations, patient history, and epidemiological information.  Fact Sheet for Patients:   StrictlyIdeas.no  Fact Sheet for Healthcare Providers: BankingDealers.co.za  This test is not yet approved or  cleared by the [place] FDA and has been authorized for detection and/or diagnosis of SARS-CoV-2 by FDA under an Emergency Use Authorization (EUA).  This EUA will remain in effect (meaning this test can be used) for the duration of the COVID-19 declaration under Section 564(b)(1) of the Act, 21 U.S.C. section 360bbb-3(b)(1), unless the authorization is terminated or revoked sooner.  Performed at  [facility], [address], [place], [place] [address]   Blood Culture (routine x 2)     Status: Abnormal   Collection Time: 03/05/20  6:15 PM   Specimen: BLOOD  Result Value Ref Range Status   Specimen Description   Final    BLOOD LEFT ASSIST CONTROL Performed at [facility], [address] [place], [place] [address]    Special Requests   Final    BOTTLES DRAWN AEROBIC AND ANAEROBIC Blood Culture results may not be optimal due to an inadequate volume of blood received in culture bottles Performed at [facility], [address], [place], [place] [address]    Culture  Setup Time   Final    GRAM NEGATIVE RODS IN BOTH AEROBIC AND ANAEROBIC BOTTLES CRITICAL RESULT CALLED TO, READ BACK BY AND VERIFIED WITHLloyd Huger AT 0375 03/06/20 SDR Performed at [facility], [place] [address], [place], [place] [address]    Culture ESCHERICHIA COLI (A)  Final   Report Status 03/08/2020 FINAL  Final   Organism ID, Bacteria ESCHERICHIA COLI  Final      Susceptibility   Escherichia coli - MIC*    AMPICILLIN >=32 RESISTANT Resistant     CEFAZOLIN <=4 SENSITIVE Sensitive     CEFEPIME <=0.12 SENSITIVE Sensitive     CEFTAZIDIME <=1 SENSITIVE Sensitive     CEFTRIAXONE <=0.25 SENSITIVE Sensitive     CIPROFLOXACIN <=0.25 SENSITIVE Sensitive     GENTAMICIN <=1 SENSITIVE Sensitive     IMIPENEM <=0.25 SENSITIVE Sensitive     TRIMETH/SULFA <=20 SENSITIVE Sensitive     AMPICILLIN/SULBACTAM >=32 RESISTANT Resistant     PIP/TAZO <=4 SENSITIVE Sensitive     * ESCHERICHIA COLI  Blood Culture (routine x 2)     Status: Abnormal   Collection Time: 03/05/20  6:15 PM   Specimen: BLOOD  Result Value Ref Range Status   Specimen Description   Final    BLOOD RIGHT ASSIST CONTROL Performed at [facility], [address] [place], [place] [address]    Special  Requests   Final    BOTTLES DRAWN AEROBIC AND ANAEROBIC Blood Culture results may not be optimal due to an  inadequate volume of blood received in culture bottles Performed at [facility], [address] [place], [place] [address]    Culture  Setup Time   Final    GRAM NEGATIVE RODS IN BOTH AEROBIC AND ANAEROBIC BOTTLES CRITICAL VALUE NOTED.  VALUE IS CONSISTENT WITH PREVIOUSLY REPORTED AND CALLED VALUE. Performed at [facility], [address] [place], [place] [address]    Culture (A)  Final    ESCHERICHIA COLI SUSCEPTIBILITIES PERFORMED ON PREVIOUS CULTURE WITHIN THE LAST 5 DAYS. Performed at [facility], [place] [address] [place], [place] [address]    Report Status 03/08/2020 FINAL  Final  Blood Culture ID Panel (Reflexed)     Status: Abnormal   Collection Time: 03/05/20  6:15 PM  Result Value Ref Range Status   Enterococcus faecalis NOT DETECTED NOT DETECTED Final   Enterococcus Faecium NOT DETECTED NOT DETECTED Final   Listeria monocytogenes NOT DETECTED NOT DETECTED Final   Staphylococcus species NOT DETECTED NOT DETECTED Final   Staphylococcus aureus (BCID) NOT DETECTED NOT DETECTED Final   Staphylococcus epidermidis NOT DETECTED NOT DETECTED Final   Staphylococcus lugdunensis NOT DETECTED NOT DETECTED Final   Streptococcus species NOT DETECTED NOT DETECTED Final   Streptococcus agalactiae NOT DETECTED NOT DETECTED Final   Streptococcus pneumoniae NOT DETECTED NOT DETECTED Final   Streptococcus pyogenes NOT DETECTED NOT DETECTED Final   A.calcoaceticus-baumannii NOT DETECTED NOT DETECTED Final   Bacteroides fragilis NOT DETECTED NOT DETECTED Final   Enterobacterales DETECTED (A) NOT DETECTED Final    Comment: Enterobacterales represent a large order of gram negative bacteria, not a single organism. CRITICAL RESULT CALLED TO, READ BACK BY AND VERIFIED WITH:  NATHAN BELUE AT 8756 03/06/20 SDR    Enterobacter cloacae complex NOT DETECTED NOT DETECTED Final   Escherichia coli DETECTED (A) NOT DETECTED Final    Comment: CRITICAL RESULT CALLED TO, READ BACK  BY AND VERIFIED WITH:  NATHAN BELUE AT 4332 03/06/20 SDR    Klebsiella aerogenes NOT DETECTED NOT DETECTED Final   Klebsiella oxytoca NOT DETECTED NOT DETECTED Final   Klebsiella pneumoniae NOT DETECTED NOT DETECTED Final   Proteus species NOT DETECTED NOT DETECTED Final   Salmonella species NOT DETECTED NOT DETECTED Final   Serratia marcescens NOT DETECTED NOT DETECTED Final   Haemophilus influenzae NOT DETECTED NOT DETECTED Final   Neisseria meningitidis NOT DETECTED NOT DETECTED Final   Pseudomonas aeruginosa NOT DETECTED NOT DETECTED Final   Stenotrophomonas maltophilia NOT DETECTED NOT DETECTED Final   Candida albicans NOT DETECTED NOT DETECTED Final   Candida auris NOT DETECTED NOT DETECTED Final   Candida glabrata NOT DETECTED NOT DETECTED Final   Candida krusei NOT DETECTED NOT DETECTED Final   Candida parapsilosis NOT DETECTED NOT DETECTED Final   Candida tropicalis NOT DETECTED NOT DETECTED Final   Cryptococcus neoformans/gattii NOT DETECTED NOT DETECTED Final   CTX-M ESBL NOT DETECTED NOT DETECTED Final   Carbapenem resistance IMP NOT DETECTED NOT DETECTED Final   Carbapenem resistance KPC NOT DETECTED NOT DETECTED Final   Carbapenem resistance NDM NOT DETECTED NOT DETECTED Final   Carbapenem resist OXA 48 LIKE NOT DETECTED NOT DETECTED Final   Carbapenem resistance VIM NOT DETECTED NOT DETECTED Final    Comment: Performed at [facility], [address] [place], [place] [address]  Culture, blood (single) w Reflex to ID  Panel     Status: None (Preliminary result)   Collection Time: 03/10/20  3:44 PM   Specimen: BLOOD  Result Value Ref Range Status   Specimen Description BLOOD BLOOD LEFT HAND  Final   Special Requests   Final    BOTTLES DRAWN AEROBIC AND ANAEROBIC Blood Culture adequate volume   Culture   Final    NO GROWTH 4 DAYS Performed at [facility], [address], [place], [place] [address]    Report Status PENDING  Incomplete  SARS  CORONAVIRUS 2 (TAT 6-24 HRS) Nasopharyngeal Nasopharyngeal Swab     Status: None   Collection Time: 03/11/20 11:28 AM   Specimen: Nasopharyngeal Swab  Result Value Ref Range Status   SARS Coronavirus 2 NEGATIVE NEGATIVE Final    Comment: (NOTE) SARS-CoV-2 target nucleic acids are NOT DETECTED.  The SARS-CoV-2 RNA is generally detectable in upper and lower respiratory specimens during the acute phase of infection. Negative results do not preclude SARS-CoV-2 infection, do not rule out co-infections with other pathogens, and should not be used as the sole basis for treatment or other patient management decisions. Negative results must be combined with clinical observations, patient history, and epidemiological information. The expected result is Negative.  Fact Sheet for Patients: SugarRoll.be  Fact Sheet for Healthcare Providers: https://www.woods-mathews.com/  This test is not yet approved or cleared by the [place] FDA and  has been authorized for detection and/or diagnosis of SARS-CoV-2 by FDA under an Emergency Use Authorization (EUA). This EUA will remain  in effect (meaning this test can be used) for the duration of the COVID-19 declaration under Se ction 564(b)(1) of the Act, 21 U.S.C. section 360bbb-3(b)(1), unless the authorization is terminated or revoked sooner.  Performed at [facility], [place] [address] [place], [place] [address]   Group A Strep by PCR     Status: None   Collection Time: 03/12/20  1:09 PM   Specimen: Throat; Sterile Swab  Result Value Ref Range Status   Group A Strep by PCR NOT DETECTED NOT DETECTED Final    Comment: Performed at [facility], [address] [place], [place] [address]  Respiratory (~20 pathogens) panel by PCR     Status: None   Collection Time: 03/12/20  1:09 PM   Specimen: Nasopharyngeal Swab; Respiratory  Result Value Ref Range Status   Adenovirus NOT DETECTED NOT  DETECTED Final   Coronavirus 229E NOT DETECTED NOT DETECTED Final    Comment: (NOTE) The Coronavirus on the Respiratory Panel, DOES NOT test for the novel  Coronavirus (2019 nCoV)    Coronavirus HKU1 NOT DETECTED NOT DETECTED Final   Coronavirus NL63 NOT DETECTED NOT DETECTED Final   Coronavirus OC43 NOT DETECTED NOT DETECTED Final   Metapneumovirus NOT DETECTED NOT DETECTED Final   Rhinovirus / Enterovirus NOT DETECTED NOT DETECTED Final   Influenza A NOT DETECTED NOT DETECTED Final   Influenza B NOT DETECTED NOT DETECTED Final   Parainfluenza Virus 1 NOT DETECTED NOT DETECTED Final   Parainfluenza Virus 2 NOT DETECTED NOT DETECTED Final   Parainfluenza Virus 3 NOT DETECTED NOT DETECTED Final   Parainfluenza Virus 4 NOT DETECTED NOT DETECTED Final   Respiratory Syncytial Virus NOT DETECTED NOT DETECTED Final   Bordetella pertussis NOT DETECTED NOT DETECTED Final   Bordetella Parapertussis NOT DETECTED NOT DETECTED Final   Chlamydophila pneumoniae NOT DETECTED NOT DETECTED Final   Mycoplasma pneumoniae NOT DETECTED NOT DETECTED Final    Comment: Performed at  [facility], [place] [address] [place], [place] [address]     Labs: BNP (last 3 results) Recent Labs    03/10/20 1138  BNP 790.3*   Basic Metabolic Panel: Recent Labs  Lab 03/08/20 0705 03/09/20 0516 03/10/20 0512 03/11/20 0526 03/12/20 0421 03/13/20 0348 03/14/20 0415  NA 139 141 143 143 142 138 139  K 4.3 4.0 4.4 4.0 4.4 4.4 4.7  CL 101 104 107 108 108 107 110  CO2 '26 25 24 23 22 23 23  ' GLUCOSE 152* 128* 129* 75 111* 110* 101*  BUN 70* 72* 74* 70* 61* 51* 42*  CREATININE 3.11* 2.90* 2.58* 2.36* 2.09* 1.94* 1.98*  CALCIUM 8.3* 8.7* 8.8* 8.7* 8.7* 8.5* 8.2*  MG 1.7 1.8  --   --   --   --   --    Liver Function Tests: No results for input(s): AST, ALT, ALKPHOS, BILITOT, PROT, ALBUMIN in the last 168 hours. No results for input(s): LIPASE, AMYLASE in the last 168 hours. No results for input(s): AMMONIA in  the last 168 hours. CBC: Recent Labs  Lab 03/08/20 0705 03/09/20 0516 03/10/20 0512 03/11/20 0526 03/12/20 0421 03/13/20 0348 03/14/20 0415  WBC 16.5*   < > 15.5* 19.2*  19.4* 20.4* 15.9* 14.3*  NEUTROABS 14.8*  --   --  13.5* 14.9* 11.8*  --   HGB 9.8*   < > 10.9* 10.8*  10.7* 10.9* 10.7* 9.2*  HCT 30.4*   < > 32.3* 34.3*  33.6* 33.6* 31.8* 28.8*  MCV 89.7   < > 88.3 91.0  90.3 88.9 88.3 90.0  PLT 139*   < > 182 214  207 231 227 210   < > = values in this interval not displayed.   Cardiac Enzymes: No results for input(s): CKTOTAL, CKMB, CKMBINDEX, TROPONINI in the last 168 hours. BNP: Invalid input(s): POCBNP CBG: Recent Labs  Lab 03/10/20 0811 03/11/20 0750 03/12/20 0746 03/13/20 0757 03/14/20 0757  GLUCAP 104* 81 104* 104* 87   D-Dimer No results for input(s): DDIMER in the last 72 hours. Hgb A1c No results for input(s): HGBA1C in the last 72 hours. Lipid Profile No results for input(s): CHOL, HDL, LDLCALC, TRIG, CHOLHDL, LDLDIRECT in the last 72 hours. Thyroid function studies No results for input(s): TSH, T4TOTAL, T3FREE, THYROIDAB in the last 72 hours.  Invalid input(s): FREET3 Anemia work up No results for input(s): VITAMINB12, FOLATE, FERRITIN, TIBC, IRON, RETICCTPCT in the last 72 hours. Urinalysis    Component Value Date/Time   COLORURINE ORANGE (A) 03/05/2020 1409   APPEARANCEUR CLOUDY (A) 03/05/2020 1409   APPEARANCEUR Clear 04/29/2013 1815   LABSPEC 1.020 03/05/2020 1409   LABSPEC 1.006 04/29/2013 1815   PHURINE 6.0 03/05/2020 1409   GLUCOSEU 100 (A) 03/05/2020 1409   GLUCOSEU Negative 04/29/2013 1815   HGBUR LARGE (A) 03/05/2020 1409   BILIRUBINUR MODERATE (A) 03/05/2020 1409   BILIRUBINUR Negative 04/29/2013 1815   KETONESUR 15 (A) 03/05/2020 1409   PROTEINUR >300 (A) 03/05/2020 1409   NITRITE POSITIVE (A) 03/05/2020 1409   LEUKOCYTESUR LARGE (A) 03/05/2020 1409   LEUKOCYTESUR Negative 04/29/2013 1815   Sepsis Labs Invalid  input(s): PROCALCITONIN,  WBC,  LACTICIDVEN Microbiology Recent Results (from the past 240 hour(s))  Urine culture     Status: Abnormal   Collection Time: 03/05/20  2:09 PM   Specimen: Urine, Clean Catch  Result Value Ref Range Status   Specimen Description   Final    URINE, CLEAN CATCH Performed at  [facility], [address] [place], [place] [address]    Special Requests   Final    NONE Performed at [facility], [place] [address], [place], [place] [address]    Culture >=100,000 COLONIES/mL ESCHERICHIA COLI (A)  Final   Report Status 03/08/2020 FINAL  Final   Organism ID, Bacteria ESCHERICHIA COLI (A)  Final      Susceptibility   Escherichia coli - MIC*    AMPICILLIN >=32 RESISTANT Resistant     CEFAZOLIN <=4 SENSITIVE Sensitive     CEFEPIME <=0.12 SENSITIVE Sensitive     CEFTRIAXONE <=0.25 SENSITIVE Sensitive     CIPROFLOXACIN <=0.25 SENSITIVE Sensitive     GENTAMICIN <=1 SENSITIVE Sensitive     IMIPENEM <=0.25 SENSITIVE Sensitive     NITROFURANTOIN <=16 SENSITIVE Sensitive     TRIMETH/SULFA <=20 SENSITIVE Sensitive     AMPICILLIN/SULBACTAM >=32 RESISTANT Resistant     PIP/TAZO <=4 SENSITIVE Sensitive     * >=100,000 COLONIES/mL ESCHERICHIA COLI  SARS Coronavirus 2 by RT PCR (hospital order, performed in [place] hospital lab) Nasopharyngeal Nasopharyngeal Swab     Status: None   Collection Time: 03/05/20  6:15 PM   Specimen: Nasopharyngeal Swab  Result Value Ref Range Status   SARS Coronavirus 2 NEGATIVE NEGATIVE Final    Comment: (NOTE) SARS-CoV-2 target nucleic acids are NOT DETECTED.  The SARS-CoV-2 RNA is generally detectable in upper and lower respiratory specimens during the acute phase of infection. The lowest concentration of SARS-CoV-2 viral copies this assay can detect is 250 copies / mL. A negative result does not preclude SARS-CoV-2 infection and should not be used as the sole basis for treatment or other patient management decisions.   A negative result may occur with improper specimen collection / handling, submission of specimen other than nasopharyngeal swab, presence of viral mutation(s) within the areas targeted by this assay, and inadequate number of viral copies (<250 copies / mL). A negative result must be combined with clinical observations, patient history, and epidemiological information.  Fact Sheet for Patients:   StrictlyIdeas.no  Fact Sheet for Healthcare Providers: BankingDealers.co.za  This test is not yet approved or  cleared by the [place] FDA and has been authorized for detection and/or diagnosis of SARS-CoV-2 by FDA under an Emergency Use Authorization (EUA).  This EUA will remain in effect (meaning this test can be used) for the duration of the COVID-19 declaration under Section 564(b)(1) of the Act, 21 U.S.C. section 360bbb-3(b)(1), unless the authorization is terminated or revoked sooner.  Performed at [facility], [address], [place], [place] [address]   Blood Culture (routine x 2)     Status: Abnormal   Collection Time: 03/05/20  6:15 PM   Specimen: BLOOD  Result Value Ref Range Status   Specimen Description   Final    BLOOD LEFT ASSIST CONTROL Performed at [facility], [address] [place], [place] [address]    Special Requests   Final    BOTTLES DRAWN AEROBIC AND ANAEROBIC Blood Culture results may not be optimal due to an inadequate volume of blood received in culture bottles Performed at [facility], [address], [place], [place] [address]    Culture  Setup Time   Final    GRAM NEGATIVE RODS IN BOTH AEROBIC AND ANAEROBIC BOTTLES CRITICAL RESULT CALLED TO, READ BACK BY AND VERIFIED WITHLloyd Huger AT 6063 03/06/20 SDR Performed at [facility], [place] [address]  [place], [place] [address]    Culture ESCHERICHIA COLI (A)  Final   Report Status 03/08/2020 FINAL  Final   Organism  ID, Bacteria ESCHERICHIA COLI  Final      Susceptibility   Escherichia coli - MIC*    AMPICILLIN >=32 RESISTANT Resistant     CEFAZOLIN <=4 SENSITIVE Sensitive     CEFEPIME <=0.12 SENSITIVE Sensitive     CEFTAZIDIME <=1 SENSITIVE Sensitive     CEFTRIAXONE <=0.25 SENSITIVE Sensitive     CIPROFLOXACIN <=0.25 SENSITIVE Sensitive     GENTAMICIN <=1 SENSITIVE Sensitive     IMIPENEM <=0.25 SENSITIVE Sensitive     TRIMETH/SULFA <=20 SENSITIVE Sensitive     AMPICILLIN/SULBACTAM >=32 RESISTANT Resistant     PIP/TAZO <=4 SENSITIVE Sensitive     * ESCHERICHIA COLI  Blood Culture (routine x 2)     Status: Abnormal   Collection Time: 03/05/20  6:15 PM   Specimen: BLOOD  Result Value Ref Range Status   Specimen Description   Final    BLOOD RIGHT ASSIST CONTROL Performed at [facility], [address], [place], [place] [address]    Special Requests   Final    BOTTLES DRAWN AEROBIC AND ANAEROBIC Blood Culture results may not be optimal due to an inadequate volume of blood received in culture bottles Performed at [facility], [address] [place], [place] [address]    Culture  Setup Time   Final    GRAM NEGATIVE RODS IN BOTH AEROBIC AND ANAEROBIC BOTTLES CRITICAL VALUE NOTED.  VALUE IS CONSISTENT WITH PREVIOUSLY REPORTED AND CALLED VALUE. Performed at [facility], [address] [place], [place] [address]    Culture (A)  Final    ESCHERICHIA COLI SUSCEPTIBILITIES PERFORMED ON PREVIOUS CULTURE WITHIN THE LAST 5 DAYS. Performed at [facility], [place] [address] [place], [place] [address]    Report Status 03/08/2020 FINAL  Final  Blood Culture ID Panel (Reflexed)     Status: Abnormal   Collection Time: 03/05/20  6:15 PM  Result Value Ref Range Status   Enterococcus faecalis NOT DETECTED NOT DETECTED Final   Enterococcus Faecium NOT DETECTED NOT DETECTED Final   Listeria monocytogenes NOT DETECTED NOT DETECTED Final   Staphylococcus species NOT  DETECTED NOT DETECTED Final   Staphylococcus aureus (BCID) NOT DETECTED NOT DETECTED Final   Staphylococcus epidermidis NOT DETECTED NOT DETECTED Final   Staphylococcus lugdunensis NOT DETECTED NOT DETECTED Final   Streptococcus species NOT DETECTED NOT DETECTED Final   Streptococcus agalactiae NOT DETECTED NOT DETECTED Final   Streptococcus pneumoniae NOT DETECTED NOT DETECTED Final   Streptococcus pyogenes NOT DETECTED NOT DETECTED Final   A.calcoaceticus-baumannii NOT DETECTED NOT DETECTED Final   Bacteroides fragilis NOT DETECTED NOT DETECTED Final   Enterobacterales DETECTED (A) NOT DETECTED Final    Comment: Enterobacterales represent a large order of gram negative bacteria, not a single organism. CRITICAL RESULT CALLED TO, READ BACK BY AND VERIFIED WITH:  NATHAN BELUE AT 0962 03/06/20 SDR    Enterobacter cloacae complex NOT DETECTED NOT DETECTED Final   Escherichia coli DETECTED (A) NOT DETECTED Final    Comment: CRITICAL RESULT CALLED TO, READ BACK BY AND VERIFIED WITH:  NATHAN BELUE AT 8366 03/06/20 SDR    Klebsiella aerogenes NOT DETECTED NOT DETECTED Final   Klebsiella oxytoca NOT DETECTED NOT DETECTED Final   Klebsiella pneumoniae NOT DETECTED NOT DETECTED Final   Proteus species NOT DETECTED NOT DETECTED Final   Salmonella species NOT DETECTED NOT  DETECTED Final   Serratia marcescens NOT DETECTED NOT DETECTED Final   Haemophilus influenzae NOT DETECTED NOT DETECTED Final   Neisseria meningitidis NOT DETECTED NOT DETECTED Final   Pseudomonas aeruginosa NOT DETECTED NOT DETECTED Final   Stenotrophomonas maltophilia NOT DETECTED NOT DETECTED Final   Candida albicans NOT DETECTED NOT DETECTED Final   Candida auris NOT DETECTED NOT DETECTED Final   Candida glabrata NOT DETECTED NOT DETECTED Final   Candida krusei NOT DETECTED NOT DETECTED Final   Candida parapsilosis NOT DETECTED NOT DETECTED Final   Candida tropicalis NOT DETECTED NOT DETECTED Final   Cryptococcus  neoformans/gattii NOT DETECTED NOT DETECTED Final   CTX-M ESBL NOT DETECTED NOT DETECTED Final   Carbapenem resistance IMP NOT DETECTED NOT DETECTED Final   Carbapenem resistance KPC NOT DETECTED NOT DETECTED Final   Carbapenem resistance NDM NOT DETECTED NOT DETECTED Final   Carbapenem resist OXA 48 LIKE NOT DETECTED NOT DETECTED Final   Carbapenem resistance VIM NOT DETECTED NOT DETECTED Final    Comment: Performed at [facility], [address] [place], [place] [address]  Culture, blood (single) w Reflex to ID Panel     Status: None (Preliminary result)   Collection Time: 03/10/20  3:44 PM   Specimen: BLOOD  Result Value Ref Range Status   Specimen Description BLOOD BLOOD LEFT HAND  Final   Special Requests   Final    BOTTLES DRAWN AEROBIC AND ANAEROBIC Blood Culture adequate volume   Culture   Final    NO GROWTH 4 DAYS Performed at [facility], [place], [place] [address]    Report Status PENDING  Incomplete  SARS CORONAVIRUS 2 (TAT 6-24 HRS) Nasopharyngeal Nasopharyngeal Swab     Status: None   Collection Time: 03/11/20 11:28 AM   Specimen: Nasopharyngeal Swab  Result Value Ref Range Status   SARS Coronavirus 2 NEGATIVE NEGATIVE Final    Comment: (NOTE) SARS-CoV-2 target nucleic acids are NOT DETECTED.  The SARS-CoV-2 RNA is generally detectable in upper and lower respiratory specimens during the acute phase of infection. Negative results do not preclude SARS-CoV-2 infection, do not rule out co-infections with other pathogens, and should not be used as the sole basis for treatment or other patient management decisions. Negative results must be combined with clinical observations, patient history, and epidemiological information. The expected result is Negative.  Fact Sheet for Patients: SugarRoll.be  Fact Sheet for Healthcare Providers: https://www.woods-mathews.com/  This test is not yet  approved or cleared by the [place] FDA and  has been authorized for detection and/or diagnosis of SARS-CoV-2 by FDA under an Emergency Use Authorization (EUA). This EUA will remain  in effect (meaning this test can be used) for the duration of the COVID-19 declaration under Se ction 564(b)(1) of the Act, 21 U.S.C. section 360bbb-3(b)(1), unless the authorization is terminated or revoked sooner.  Performed at [facility], [place] [address], [place], [place] [address]   Group A Strep by PCR     Status: None   Collection Time: 03/12/20  1:09 PM   Specimen: Throat; Sterile Swab  Result Value Ref Range Status   Group A Strep by PCR NOT DETECTED NOT DETECTED Final    Comment: Performed at [facility], [place],  [address]  Respiratory (~20 pathogens) panel by PCR     Status: None   Collection Time: 03/12/20  1:09 PM   Specimen: Nasopharyngeal Swab; Respiratory  Result Value Ref Range  Status   Adenovirus NOT DETECTED NOT DETECTED Final   Coronavirus 229E NOT DETECTED NOT DETECTED Final    Comment: (NOTE) The Coronavirus on the Respiratory Panel, DOES NOT test for the novel  Coronavirus (2019 nCoV)    Coronavirus HKU1 NOT DETECTED NOT DETECTED Final   Coronavirus NL63 NOT DETECTED NOT DETECTED Final   Coronavirus OC43 NOT DETECTED NOT DETECTED Final   Metapneumovirus NOT DETECTED NOT DETECTED Final   Rhinovirus / Enterovirus NOT DETECTED NOT DETECTED Final   Influenza A NOT DETECTED NOT DETECTED Final   Influenza B NOT DETECTED NOT DETECTED Final   Parainfluenza Virus 1 NOT DETECTED NOT DETECTED Final   Parainfluenza Virus 2 NOT DETECTED NOT DETECTED Final   Parainfluenza Virus 3 NOT DETECTED NOT DETECTED Final   Parainfluenza Virus 4 NOT DETECTED NOT DETECTED Final   Respiratory Syncytial Virus NOT DETECTED NOT DETECTED Final   Bordetella pertussis NOT DETECTED NOT DETECTED Final   Bordetella Parapertussis NOT DETECTED NOT DETECTED Final    Chlamydophila pneumoniae NOT DETECTED NOT DETECTED Final   Mycoplasma pneumoniae NOT DETECTED NOT DETECTED Final    Comment: Performed at [facility], [place] [address] [place], [place] [address]     Time coordinating discharge: Over 30 minutes  SIGNED:   Nolberto Hanlon, MD  [facility] 03/14/2020, 1:09 PM Pager   If 7PM-7AM, please contact night-coverage [email or web address] Password [email or web address]

## 2020-03-14 NOTE — TOC Transition Note (Signed)
Transition of Care [facility]) - CM/SW Discharge Note   Patient Details  Name: Gloria Day MRN: [id number] Date of Birth: [birth year]  Transition of Care [facility]) CM/SW Contact:  Magnus Ivan, LCSW Phone Number: 03/14/2020, 12:03 PM   Clinical Narrative:   Patient to discharge home today with IV medications and [place], PT, OT, Aide services. Pam with [facility] is aware and scheduled IV teaching with daughter at bedside at 3:30 pm prior to DC. [place] with [facility] also aware of DC today.    Final next level of care: [place] Barriers to Discharge: Barriers Resolved   Patient Goals and CMS Choice Patient states their goals for this hospitalization and ongoing recovery are:: home with home health CMS Medicare.gov Compare Post Acute Care list provided to:: Patient Represenative (must comment) Choice offered to / list presented to : Adult Children  Discharge Placement                       Discharge Plan and Services                DME Arranged: IV pump/equipment   Date DME Agency Contacted: 03/14/20   Representative spoke with at DME Agency: [place]: PT,OT,RN,Nurse's [place]: [facility] Date [facility] Agency Contacted: 03/14/20   Representative spoke with at [place]: [place] ([place]) Interventions     Readmission Risk Interventions No flowsheet data found.

## 2020-03-15 DIAGNOSIS — M109 Gout, unspecified: Secondary | ICD-10-CM | POA: Diagnosis not present

## 2020-03-15 DIAGNOSIS — I5032 Chronic diastolic (congestive) heart failure: Secondary | ICD-10-CM | POA: Diagnosis not present

## 2020-03-15 DIAGNOSIS — N184 Chronic kidney disease, stage 4 (severe): Secondary | ICD-10-CM | POA: Diagnosis not present

## 2020-03-15 DIAGNOSIS — D631 Anemia in chronic kidney disease: Secondary | ICD-10-CM | POA: Diagnosis not present

## 2020-03-15 DIAGNOSIS — I130 Hypertensive heart and chronic kidney disease with heart failure and stage 1 through stage 4 chronic kidney disease, or unspecified chronic kidney disease: Secondary | ICD-10-CM | POA: Diagnosis not present

## 2020-03-15 DIAGNOSIS — N3000 Acute cystitis without hematuria: Secondary | ICD-10-CM | POA: Diagnosis not present

## 2020-03-15 DIAGNOSIS — A4151 Sepsis due to Escherichia coli [E. coli]: Secondary | ICD-10-CM | POA: Diagnosis not present

## 2020-03-15 DIAGNOSIS — K529 Noninfective gastroenteritis and colitis, unspecified: Secondary | ICD-10-CM | POA: Diagnosis not present

## 2020-03-15 DIAGNOSIS — J449 Chronic obstructive pulmonary disease, unspecified: Secondary | ICD-10-CM | POA: Diagnosis not present

## 2020-03-15 LAB — CULTURE, BLOOD (SINGLE)
Culture: NO GROWTH
Special Requests: ADEQUATE

## 2020-03-16 DIAGNOSIS — A4151 Sepsis due to Escherichia coli [E. coli]: Secondary | ICD-10-CM | POA: Diagnosis not present

## 2020-03-16 DIAGNOSIS — I130 Hypertensive heart and chronic kidney disease with heart failure and stage 1 through stage 4 chronic kidney disease, or unspecified chronic kidney disease: Secondary | ICD-10-CM | POA: Diagnosis not present

## 2020-03-16 DIAGNOSIS — N184 Chronic kidney disease, stage 4 (severe): Secondary | ICD-10-CM | POA: Diagnosis not present

## 2020-03-16 DIAGNOSIS — M109 Gout, unspecified: Secondary | ICD-10-CM | POA: Diagnosis not present

## 2020-03-16 DIAGNOSIS — D631 Anemia in chronic kidney disease: Secondary | ICD-10-CM | POA: Diagnosis not present

## 2020-03-16 DIAGNOSIS — I5032 Chronic diastolic (congestive) heart failure: Secondary | ICD-10-CM | POA: Diagnosis not present

## 2020-03-16 DIAGNOSIS — N3000 Acute cystitis without hematuria: Secondary | ICD-10-CM | POA: Diagnosis not present

## 2020-03-16 DIAGNOSIS — K529 Noninfective gastroenteritis and colitis, unspecified: Secondary | ICD-10-CM | POA: Diagnosis not present

## 2020-03-16 DIAGNOSIS — J449 Chronic obstructive pulmonary disease, unspecified: Secondary | ICD-10-CM | POA: Diagnosis not present

## 2020-03-17 DIAGNOSIS — D631 Anemia in chronic kidney disease: Secondary | ICD-10-CM | POA: Diagnosis not present

## 2020-03-17 DIAGNOSIS — N184 Chronic kidney disease, stage 4 (severe): Secondary | ICD-10-CM | POA: Diagnosis not present

## 2020-03-17 DIAGNOSIS — J449 Chronic obstructive pulmonary disease, unspecified: Secondary | ICD-10-CM | POA: Diagnosis not present

## 2020-03-17 DIAGNOSIS — I130 Hypertensive heart and chronic kidney disease with heart failure and stage 1 through stage 4 chronic kidney disease, or unspecified chronic kidney disease: Secondary | ICD-10-CM | POA: Diagnosis not present

## 2020-03-17 DIAGNOSIS — I5032 Chronic diastolic (congestive) heart failure: Secondary | ICD-10-CM | POA: Diagnosis not present

## 2020-03-17 DIAGNOSIS — A4151 Sepsis due to Escherichia coli [E. coli]: Secondary | ICD-10-CM | POA: Diagnosis not present

## 2020-03-17 DIAGNOSIS — M109 Gout, unspecified: Secondary | ICD-10-CM | POA: Diagnosis not present

## 2020-03-17 DIAGNOSIS — N3000 Acute cystitis without hematuria: Secondary | ICD-10-CM | POA: Diagnosis not present

## 2020-03-17 DIAGNOSIS — K529 Noninfective gastroenteritis and colitis, unspecified: Secondary | ICD-10-CM | POA: Diagnosis not present

## 2020-03-19 ENCOUNTER — Telehealth: Payer: Self-pay

## 2020-03-19 DIAGNOSIS — I129 Hypertensive chronic kidney disease with stage 1 through stage 4 chronic kidney disease, or unspecified chronic kidney disease: Secondary | ICD-10-CM | POA: Diagnosis not present

## 2020-03-19 DIAGNOSIS — D631 Anemia in chronic kidney disease: Secondary | ICD-10-CM | POA: Diagnosis not present

## 2020-03-19 DIAGNOSIS — N184 Chronic kidney disease, stage 4 (severe): Secondary | ICD-10-CM | POA: Diagnosis not present

## 2020-03-19 DIAGNOSIS — R809 Proteinuria, unspecified: Secondary | ICD-10-CM | POA: Diagnosis not present

## 2020-03-19 DIAGNOSIS — E875 Hyperkalemia: Secondary | ICD-10-CM | POA: Diagnosis not present

## 2020-03-19 DIAGNOSIS — N2581 Secondary hyperparathyroidism of renal origin: Secondary | ICD-10-CM | POA: Diagnosis not present

## 2020-03-19 NOTE — Telephone Encounter (Signed)
Patient called and left a voicemail for Dr. Delaine Lame office.  Patient did not state what she was calling in regards to. I have tried to reach out to the patient and I have been unsuccessful. I have left a message for the patient to call back. Harlen Danford T Brooks Sailors

## 2020-03-20 ENCOUNTER — Other Ambulatory Visit: Payer: Self-pay

## 2020-03-20 ENCOUNTER — Ambulatory Visit: Payer: Medicare HMO | Attending: Infectious Diseases | Admitting: Infectious Diseases

## 2020-03-20 ENCOUNTER — Encounter: Payer: Self-pay | Admitting: Infectious Diseases

## 2020-03-20 DIAGNOSIS — K5732 Diverticulitis of large intestine without perforation or abscess without bleeding: Secondary | ICD-10-CM | POA: Insufficient documentation

## 2020-03-20 DIAGNOSIS — M109 Gout, unspecified: Secondary | ICD-10-CM | POA: Diagnosis not present

## 2020-03-20 DIAGNOSIS — R7881 Bacteremia: Secondary | ICD-10-CM | POA: Insufficient documentation

## 2020-03-20 DIAGNOSIS — B9620 Unspecified Escherichia coli [E. coli] as the cause of diseases classified elsewhere: Secondary | ICD-10-CM | POA: Insufficient documentation

## 2020-03-20 DIAGNOSIS — I5032 Chronic diastolic (congestive) heart failure: Secondary | ICD-10-CM | POA: Diagnosis not present

## 2020-03-20 DIAGNOSIS — B9629 Other Escherichia coli [E. coli] as the cause of diseases classified elsewhere: Secondary | ICD-10-CM | POA: Diagnosis not present

## 2020-03-20 DIAGNOSIS — A4151 Sepsis due to Escherichia coli [E. coli]: Secondary | ICD-10-CM | POA: Diagnosis not present

## 2020-03-20 DIAGNOSIS — J449 Chronic obstructive pulmonary disease, unspecified: Secondary | ICD-10-CM | POA: Diagnosis not present

## 2020-03-20 DIAGNOSIS — K529 Noninfective gastroenteritis and colitis, unspecified: Secondary | ICD-10-CM | POA: Diagnosis not present

## 2020-03-20 DIAGNOSIS — N184 Chronic kidney disease, stage 4 (severe): Secondary | ICD-10-CM | POA: Diagnosis not present

## 2020-03-20 DIAGNOSIS — N390 Urinary tract infection, site not specified: Secondary | ICD-10-CM | POA: Diagnosis not present

## 2020-03-20 DIAGNOSIS — I130 Hypertensive heart and chronic kidney disease with heart failure and stage 1 through stage 4 chronic kidney disease, or unspecified chronic kidney disease: Secondary | ICD-10-CM | POA: Diagnosis not present

## 2020-03-20 DIAGNOSIS — N3000 Acute cystitis without hematuria: Secondary | ICD-10-CM | POA: Diagnosis not present

## 2020-03-20 DIAGNOSIS — D631 Anemia in chronic kidney disease: Secondary | ICD-10-CM | POA: Diagnosis not present

## 2020-03-20 NOTE — Progress Notes (Signed)
The purpose of this virtual visit is to provide medical care while limiting exposure to the novel coronavirus (COVID19) for both patient and office staff.   Consent was obtained for phone visit:  Yes.   Answered questions that patient had about telehealth interaction:  Yes.   I discussed the limitations, risks, security and privacy concerns of performing an evaluation and management service by telephone. I also discussed with the patient that there may be a patient responsible charge related to this service. The patient expressed understanding and agreed to proceed.   Patient Location: Home Provider Location: office Daughter on the call ,along  with patient, and provider Follow-up visit after her recent hospitalization Patient was recently in the hospital between 03/05/2020 until 03/14/2020 And was diagnosed to have E. coli bacteremia and E. coli UTI.  She initially had severe leukocytosis and tachycardia and fever in the setting of UTI which improved with antibiotics 1 L for the leukocytosis to get worse.  A CT abdomen done on 03/12/2020 showed sigmoid diverticulosis without diverticulitis.  But there was postinflammatory scarring of the sigmoid colon.  There was a concern that this could be a flareup of her diverticulitis because she was having left-sided abdominal pain.  So she was given Zosyn on discharge for another week.  She will complete Zosyn tomorrow.  Her daughter is giving the medication She had some trouble giving it yesterday because she could not open the line but a nurse friend of hers helped.  Patient has hoarse voice today.  She does not have any fever.  No cough She will be seeing her PCP as follow-up soon. Impression/recommendation E. coli bacteremia and UTI Sigmoid diverticulitis Patient is completing IV Zosyn at home tomorrow.  PICC line will be removed.  We will asked the home infusion company to fax her labs. Time spent 12 minutes.

## 2020-03-21 DIAGNOSIS — I5032 Chronic diastolic (congestive) heart failure: Secondary | ICD-10-CM | POA: Diagnosis not present

## 2020-03-21 DIAGNOSIS — N390 Urinary tract infection, site not specified: Secondary | ICD-10-CM | POA: Diagnosis not present

## 2020-03-21 DIAGNOSIS — J441 Chronic obstructive pulmonary disease with (acute) exacerbation: Secondary | ICD-10-CM | POA: Diagnosis not present

## 2020-03-21 DIAGNOSIS — K219 Gastro-esophageal reflux disease without esophagitis: Secondary | ICD-10-CM | POA: Diagnosis not present

## 2020-03-21 DIAGNOSIS — K5792 Diverticulitis of intestine, part unspecified, without perforation or abscess without bleeding: Secondary | ICD-10-CM | POA: Diagnosis not present

## 2020-03-21 DIAGNOSIS — I130 Hypertensive heart and chronic kidney disease with heart failure and stage 1 through stage 4 chronic kidney disease, or unspecified chronic kidney disease: Secondary | ICD-10-CM | POA: Diagnosis not present

## 2020-03-21 DIAGNOSIS — N184 Chronic kidney disease, stage 4 (severe): Secondary | ICD-10-CM | POA: Diagnosis not present

## 2020-03-22 DIAGNOSIS — N184 Chronic kidney disease, stage 4 (severe): Secondary | ICD-10-CM | POA: Diagnosis not present

## 2020-03-22 DIAGNOSIS — A4151 Sepsis due to Escherichia coli [E. coli]: Secondary | ICD-10-CM | POA: Diagnosis not present

## 2020-03-22 DIAGNOSIS — N3000 Acute cystitis without hematuria: Secondary | ICD-10-CM | POA: Diagnosis not present

## 2020-03-22 DIAGNOSIS — I130 Hypertensive heart and chronic kidney disease with heart failure and stage 1 through stage 4 chronic kidney disease, or unspecified chronic kidney disease: Secondary | ICD-10-CM | POA: Diagnosis not present

## 2020-03-22 DIAGNOSIS — I5032 Chronic diastolic (congestive) heart failure: Secondary | ICD-10-CM | POA: Diagnosis not present

## 2020-03-22 DIAGNOSIS — K529 Noninfective gastroenteritis and colitis, unspecified: Secondary | ICD-10-CM | POA: Diagnosis not present

## 2020-03-22 DIAGNOSIS — D631 Anemia in chronic kidney disease: Secondary | ICD-10-CM | POA: Diagnosis not present

## 2020-03-22 DIAGNOSIS — M109 Gout, unspecified: Secondary | ICD-10-CM | POA: Diagnosis not present

## 2020-03-22 DIAGNOSIS — J449 Chronic obstructive pulmonary disease, unspecified: Secondary | ICD-10-CM | POA: Diagnosis not present

## 2020-03-26 DIAGNOSIS — K529 Noninfective gastroenteritis and colitis, unspecified: Secondary | ICD-10-CM | POA: Diagnosis not present

## 2020-03-26 DIAGNOSIS — I130 Hypertensive heart and chronic kidney disease with heart failure and stage 1 through stage 4 chronic kidney disease, or unspecified chronic kidney disease: Secondary | ICD-10-CM | POA: Diagnosis not present

## 2020-03-26 DIAGNOSIS — D631 Anemia in chronic kidney disease: Secondary | ICD-10-CM | POA: Diagnosis not present

## 2020-03-26 DIAGNOSIS — N184 Chronic kidney disease, stage 4 (severe): Secondary | ICD-10-CM | POA: Diagnosis not present

## 2020-03-26 DIAGNOSIS — M109 Gout, unspecified: Secondary | ICD-10-CM | POA: Diagnosis not present

## 2020-03-26 DIAGNOSIS — I5032 Chronic diastolic (congestive) heart failure: Secondary | ICD-10-CM | POA: Diagnosis not present

## 2020-03-26 DIAGNOSIS — J449 Chronic obstructive pulmonary disease, unspecified: Secondary | ICD-10-CM | POA: Diagnosis not present

## 2020-03-26 DIAGNOSIS — N3000 Acute cystitis without hematuria: Secondary | ICD-10-CM | POA: Diagnosis not present

## 2020-03-26 DIAGNOSIS — A4151 Sepsis due to Escherichia coli [E. coli]: Secondary | ICD-10-CM | POA: Diagnosis not present

## 2020-03-27 DIAGNOSIS — N3000 Acute cystitis without hematuria: Secondary | ICD-10-CM | POA: Diagnosis not present

## 2020-03-27 DIAGNOSIS — J449 Chronic obstructive pulmonary disease, unspecified: Secondary | ICD-10-CM | POA: Diagnosis not present

## 2020-03-27 DIAGNOSIS — A4151 Sepsis due to Escherichia coli [E. coli]: Secondary | ICD-10-CM | POA: Diagnosis not present

## 2020-03-27 DIAGNOSIS — I5032 Chronic diastolic (congestive) heart failure: Secondary | ICD-10-CM | POA: Diagnosis not present

## 2020-03-27 DIAGNOSIS — N2581 Secondary hyperparathyroidism of renal origin: Secondary | ICD-10-CM | POA: Diagnosis not present

## 2020-03-27 DIAGNOSIS — N179 Acute kidney failure, unspecified: Secondary | ICD-10-CM | POA: Diagnosis not present

## 2020-03-27 DIAGNOSIS — R809 Proteinuria, unspecified: Secondary | ICD-10-CM | POA: Diagnosis not present

## 2020-03-27 DIAGNOSIS — E875 Hyperkalemia: Secondary | ICD-10-CM | POA: Diagnosis not present

## 2020-03-27 DIAGNOSIS — I129 Hypertensive chronic kidney disease with stage 1 through stage 4 chronic kidney disease, or unspecified chronic kidney disease: Secondary | ICD-10-CM | POA: Diagnosis not present

## 2020-03-27 DIAGNOSIS — R062 Wheezing: Secondary | ICD-10-CM | POA: Diagnosis not present

## 2020-03-27 DIAGNOSIS — K529 Noninfective gastroenteritis and colitis, unspecified: Secondary | ICD-10-CM | POA: Diagnosis not present

## 2020-03-27 DIAGNOSIS — D631 Anemia in chronic kidney disease: Secondary | ICD-10-CM | POA: Diagnosis not present

## 2020-03-27 DIAGNOSIS — N184 Chronic kidney disease, stage 4 (severe): Secondary | ICD-10-CM | POA: Diagnosis not present

## 2020-03-27 DIAGNOSIS — I130 Hypertensive heart and chronic kidney disease with heart failure and stage 1 through stage 4 chronic kidney disease, or unspecified chronic kidney disease: Secondary | ICD-10-CM | POA: Diagnosis not present

## 2020-03-27 DIAGNOSIS — M109 Gout, unspecified: Secondary | ICD-10-CM | POA: Diagnosis not present

## 2020-03-28 DIAGNOSIS — A4151 Sepsis due to Escherichia coli [E. coli]: Secondary | ICD-10-CM | POA: Diagnosis not present

## 2020-03-28 DIAGNOSIS — N184 Chronic kidney disease, stage 4 (severe): Secondary | ICD-10-CM | POA: Diagnosis not present

## 2020-03-28 DIAGNOSIS — I5032 Chronic diastolic (congestive) heart failure: Secondary | ICD-10-CM | POA: Diagnosis not present

## 2020-03-28 DIAGNOSIS — K529 Noninfective gastroenteritis and colitis, unspecified: Secondary | ICD-10-CM | POA: Diagnosis not present

## 2020-03-28 DIAGNOSIS — N3000 Acute cystitis without hematuria: Secondary | ICD-10-CM | POA: Diagnosis not present

## 2020-03-28 DIAGNOSIS — M109 Gout, unspecified: Secondary | ICD-10-CM | POA: Diagnosis not present

## 2020-03-28 DIAGNOSIS — I130 Hypertensive heart and chronic kidney disease with heart failure and stage 1 through stage 4 chronic kidney disease, or unspecified chronic kidney disease: Secondary | ICD-10-CM | POA: Diagnosis not present

## 2020-03-28 DIAGNOSIS — D631 Anemia in chronic kidney disease: Secondary | ICD-10-CM | POA: Diagnosis not present

## 2020-03-28 DIAGNOSIS — J449 Chronic obstructive pulmonary disease, unspecified: Secondary | ICD-10-CM | POA: Diagnosis not present

## 2020-03-29 DIAGNOSIS — I5032 Chronic diastolic (congestive) heart failure: Secondary | ICD-10-CM | POA: Diagnosis not present

## 2020-03-29 DIAGNOSIS — N184 Chronic kidney disease, stage 4 (severe): Secondary | ICD-10-CM | POA: Diagnosis not present

## 2020-03-29 DIAGNOSIS — I130 Hypertensive heart and chronic kidney disease with heart failure and stage 1 through stage 4 chronic kidney disease, or unspecified chronic kidney disease: Secondary | ICD-10-CM | POA: Diagnosis not present

## 2020-03-29 DIAGNOSIS — M109 Gout, unspecified: Secondary | ICD-10-CM | POA: Diagnosis not present

## 2020-03-29 DIAGNOSIS — N3000 Acute cystitis without hematuria: Secondary | ICD-10-CM | POA: Diagnosis not present

## 2020-03-29 DIAGNOSIS — K529 Noninfective gastroenteritis and colitis, unspecified: Secondary | ICD-10-CM | POA: Diagnosis not present

## 2020-03-29 DIAGNOSIS — J449 Chronic obstructive pulmonary disease, unspecified: Secondary | ICD-10-CM | POA: Diagnosis not present

## 2020-03-29 DIAGNOSIS — D631 Anemia in chronic kidney disease: Secondary | ICD-10-CM | POA: Diagnosis not present

## 2020-03-29 DIAGNOSIS — A4151 Sepsis due to Escherichia coli [E. coli]: Secondary | ICD-10-CM | POA: Diagnosis not present

## 2020-04-02 DIAGNOSIS — I5032 Chronic diastolic (congestive) heart failure: Secondary | ICD-10-CM | POA: Diagnosis not present

## 2020-04-02 DIAGNOSIS — J449 Chronic obstructive pulmonary disease, unspecified: Secondary | ICD-10-CM | POA: Diagnosis not present

## 2020-04-02 DIAGNOSIS — A4151 Sepsis due to Escherichia coli [E. coli]: Secondary | ICD-10-CM | POA: Diagnosis not present

## 2020-04-02 DIAGNOSIS — N184 Chronic kidney disease, stage 4 (severe): Secondary | ICD-10-CM | POA: Diagnosis not present

## 2020-04-02 DIAGNOSIS — D631 Anemia in chronic kidney disease: Secondary | ICD-10-CM | POA: Diagnosis not present

## 2020-04-02 DIAGNOSIS — I130 Hypertensive heart and chronic kidney disease with heart failure and stage 1 through stage 4 chronic kidney disease, or unspecified chronic kidney disease: Secondary | ICD-10-CM | POA: Diagnosis not present

## 2020-04-02 DIAGNOSIS — N3000 Acute cystitis without hematuria: Secondary | ICD-10-CM | POA: Diagnosis not present

## 2020-04-02 DIAGNOSIS — M109 Gout, unspecified: Secondary | ICD-10-CM | POA: Diagnosis not present

## 2020-04-02 DIAGNOSIS — K529 Noninfective gastroenteritis and colitis, unspecified: Secondary | ICD-10-CM | POA: Diagnosis not present

## 2020-04-03 DIAGNOSIS — N3000 Acute cystitis without hematuria: Secondary | ICD-10-CM | POA: Diagnosis not present

## 2020-04-03 DIAGNOSIS — N184 Chronic kidney disease, stage 4 (severe): Secondary | ICD-10-CM | POA: Diagnosis not present

## 2020-04-03 DIAGNOSIS — I130 Hypertensive heart and chronic kidney disease with heart failure and stage 1 through stage 4 chronic kidney disease, or unspecified chronic kidney disease: Secondary | ICD-10-CM | POA: Diagnosis not present

## 2020-04-03 DIAGNOSIS — A4151 Sepsis due to Escherichia coli [E. coli]: Secondary | ICD-10-CM | POA: Diagnosis not present

## 2020-04-03 DIAGNOSIS — D631 Anemia in chronic kidney disease: Secondary | ICD-10-CM | POA: Diagnosis not present

## 2020-04-03 DIAGNOSIS — J449 Chronic obstructive pulmonary disease, unspecified: Secondary | ICD-10-CM | POA: Diagnosis not present

## 2020-04-03 DIAGNOSIS — K529 Noninfective gastroenteritis and colitis, unspecified: Secondary | ICD-10-CM | POA: Diagnosis not present

## 2020-04-03 DIAGNOSIS — I5032 Chronic diastolic (congestive) heart failure: Secondary | ICD-10-CM | POA: Diagnosis not present

## 2020-04-03 DIAGNOSIS — M109 Gout, unspecified: Secondary | ICD-10-CM | POA: Diagnosis not present

## 2020-04-10 DIAGNOSIS — K529 Noninfective gastroenteritis and colitis, unspecified: Secondary | ICD-10-CM | POA: Diagnosis not present

## 2020-04-10 DIAGNOSIS — D631 Anemia in chronic kidney disease: Secondary | ICD-10-CM | POA: Diagnosis not present

## 2020-04-10 DIAGNOSIS — J449 Chronic obstructive pulmonary disease, unspecified: Secondary | ICD-10-CM | POA: Diagnosis not present

## 2020-04-10 DIAGNOSIS — I5032 Chronic diastolic (congestive) heart failure: Secondary | ICD-10-CM | POA: Diagnosis not present

## 2020-04-10 DIAGNOSIS — N184 Chronic kidney disease, stage 4 (severe): Secondary | ICD-10-CM | POA: Diagnosis not present

## 2020-04-10 DIAGNOSIS — A4151 Sepsis due to Escherichia coli [E. coli]: Secondary | ICD-10-CM | POA: Diagnosis not present

## 2020-04-10 DIAGNOSIS — N3000 Acute cystitis without hematuria: Secondary | ICD-10-CM | POA: Diagnosis not present

## 2020-04-10 DIAGNOSIS — M109 Gout, unspecified: Secondary | ICD-10-CM | POA: Diagnosis not present

## 2020-04-10 DIAGNOSIS — I130 Hypertensive heart and chronic kidney disease with heart failure and stage 1 through stage 4 chronic kidney disease, or unspecified chronic kidney disease: Secondary | ICD-10-CM | POA: Diagnosis not present

## 2020-04-11 DIAGNOSIS — A4151 Sepsis due to Escherichia coli [E. coli]: Secondary | ICD-10-CM | POA: Diagnosis not present

## 2020-04-11 DIAGNOSIS — I5032 Chronic diastolic (congestive) heart failure: Secondary | ICD-10-CM | POA: Diagnosis not present

## 2020-04-11 DIAGNOSIS — K529 Noninfective gastroenteritis and colitis, unspecified: Secondary | ICD-10-CM | POA: Diagnosis not present

## 2020-04-11 DIAGNOSIS — I130 Hypertensive heart and chronic kidney disease with heart failure and stage 1 through stage 4 chronic kidney disease, or unspecified chronic kidney disease: Secondary | ICD-10-CM | POA: Diagnosis not present

## 2020-04-11 DIAGNOSIS — M109 Gout, unspecified: Secondary | ICD-10-CM | POA: Diagnosis not present

## 2020-04-11 DIAGNOSIS — J449 Chronic obstructive pulmonary disease, unspecified: Secondary | ICD-10-CM | POA: Diagnosis not present

## 2020-04-11 DIAGNOSIS — N3000 Acute cystitis without hematuria: Secondary | ICD-10-CM | POA: Diagnosis not present

## 2020-04-11 DIAGNOSIS — N184 Chronic kidney disease, stage 4 (severe): Secondary | ICD-10-CM | POA: Diagnosis not present

## 2020-04-11 DIAGNOSIS — D631 Anemia in chronic kidney disease: Secondary | ICD-10-CM | POA: Diagnosis not present

## 2020-04-18 DIAGNOSIS — M109 Gout, unspecified: Secondary | ICD-10-CM | POA: Diagnosis not present

## 2020-04-18 DIAGNOSIS — K529 Noninfective gastroenteritis and colitis, unspecified: Secondary | ICD-10-CM | POA: Diagnosis not present

## 2020-04-18 DIAGNOSIS — D631 Anemia in chronic kidney disease: Secondary | ICD-10-CM | POA: Diagnosis not present

## 2020-04-18 DIAGNOSIS — J449 Chronic obstructive pulmonary disease, unspecified: Secondary | ICD-10-CM | POA: Diagnosis not present

## 2020-04-18 DIAGNOSIS — A4151 Sepsis due to Escherichia coli [E. coli]: Secondary | ICD-10-CM | POA: Diagnosis not present

## 2020-04-18 DIAGNOSIS — I5032 Chronic diastolic (congestive) heart failure: Secondary | ICD-10-CM | POA: Diagnosis not present

## 2020-04-18 DIAGNOSIS — I130 Hypertensive heart and chronic kidney disease with heart failure and stage 1 through stage 4 chronic kidney disease, or unspecified chronic kidney disease: Secondary | ICD-10-CM | POA: Diagnosis not present

## 2020-04-18 DIAGNOSIS — N184 Chronic kidney disease, stage 4 (severe): Secondary | ICD-10-CM | POA: Diagnosis not present

## 2020-04-18 DIAGNOSIS — N3000 Acute cystitis without hematuria: Secondary | ICD-10-CM | POA: Diagnosis not present

## 2020-04-19 DIAGNOSIS — R399 Unspecified symptoms and signs involving the genitourinary system: Secondary | ICD-10-CM | POA: Diagnosis not present

## 2020-04-20 DIAGNOSIS — K529 Noninfective gastroenteritis and colitis, unspecified: Secondary | ICD-10-CM | POA: Diagnosis not present

## 2020-04-20 DIAGNOSIS — I5032 Chronic diastolic (congestive) heart failure: Secondary | ICD-10-CM | POA: Diagnosis not present

## 2020-04-20 DIAGNOSIS — J449 Chronic obstructive pulmonary disease, unspecified: Secondary | ICD-10-CM | POA: Diagnosis not present

## 2020-04-20 DIAGNOSIS — D631 Anemia in chronic kidney disease: Secondary | ICD-10-CM | POA: Diagnosis not present

## 2020-04-20 DIAGNOSIS — A4151 Sepsis due to Escherichia coli [E. coli]: Secondary | ICD-10-CM | POA: Diagnosis not present

## 2020-04-20 DIAGNOSIS — M109 Gout, unspecified: Secondary | ICD-10-CM | POA: Diagnosis not present

## 2020-04-20 DIAGNOSIS — R399 Unspecified symptoms and signs involving the genitourinary system: Secondary | ICD-10-CM | POA: Diagnosis not present

## 2020-04-20 DIAGNOSIS — N184 Chronic kidney disease, stage 4 (severe): Secondary | ICD-10-CM | POA: Diagnosis not present

## 2020-04-20 DIAGNOSIS — I130 Hypertensive heart and chronic kidney disease with heart failure and stage 1 through stage 4 chronic kidney disease, or unspecified chronic kidney disease: Secondary | ICD-10-CM | POA: Diagnosis not present

## 2020-04-20 DIAGNOSIS — N3000 Acute cystitis without hematuria: Secondary | ICD-10-CM | POA: Diagnosis not present

## 2020-04-24 DIAGNOSIS — J449 Chronic obstructive pulmonary disease, unspecified: Secondary | ICD-10-CM | POA: Diagnosis not present

## 2020-04-24 DIAGNOSIS — K529 Noninfective gastroenteritis and colitis, unspecified: Secondary | ICD-10-CM | POA: Diagnosis not present

## 2020-04-24 DIAGNOSIS — M109 Gout, unspecified: Secondary | ICD-10-CM | POA: Diagnosis not present

## 2020-04-24 DIAGNOSIS — N184 Chronic kidney disease, stage 4 (severe): Secondary | ICD-10-CM | POA: Diagnosis not present

## 2020-04-24 DIAGNOSIS — I5032 Chronic diastolic (congestive) heart failure: Secondary | ICD-10-CM | POA: Diagnosis not present

## 2020-04-24 DIAGNOSIS — N3000 Acute cystitis without hematuria: Secondary | ICD-10-CM | POA: Diagnosis not present

## 2020-04-24 DIAGNOSIS — A4151 Sepsis due to Escherichia coli [E. coli]: Secondary | ICD-10-CM | POA: Diagnosis not present

## 2020-04-24 DIAGNOSIS — I130 Hypertensive heart and chronic kidney disease with heart failure and stage 1 through stage 4 chronic kidney disease, or unspecified chronic kidney disease: Secondary | ICD-10-CM | POA: Diagnosis not present

## 2020-04-24 DIAGNOSIS — D631 Anemia in chronic kidney disease: Secondary | ICD-10-CM | POA: Diagnosis not present

## 2020-05-01 DIAGNOSIS — N3000 Acute cystitis without hematuria: Secondary | ICD-10-CM | POA: Diagnosis not present

## 2020-05-01 DIAGNOSIS — K529 Noninfective gastroenteritis and colitis, unspecified: Secondary | ICD-10-CM | POA: Diagnosis not present

## 2020-05-01 DIAGNOSIS — N184 Chronic kidney disease, stage 4 (severe): Secondary | ICD-10-CM | POA: Diagnosis not present

## 2020-05-01 DIAGNOSIS — J449 Chronic obstructive pulmonary disease, unspecified: Secondary | ICD-10-CM | POA: Diagnosis not present

## 2020-05-01 DIAGNOSIS — M109 Gout, unspecified: Secondary | ICD-10-CM | POA: Diagnosis not present

## 2020-05-01 DIAGNOSIS — A4151 Sepsis due to Escherichia coli [E. coli]: Secondary | ICD-10-CM | POA: Diagnosis not present

## 2020-05-01 DIAGNOSIS — I5032 Chronic diastolic (congestive) heart failure: Secondary | ICD-10-CM | POA: Diagnosis not present

## 2020-05-01 DIAGNOSIS — I130 Hypertensive heart and chronic kidney disease with heart failure and stage 1 through stage 4 chronic kidney disease, or unspecified chronic kidney disease: Secondary | ICD-10-CM | POA: Diagnosis not present

## 2020-05-01 DIAGNOSIS — D631 Anemia in chronic kidney disease: Secondary | ICD-10-CM | POA: Diagnosis not present

## 2020-05-21 DIAGNOSIS — N2581 Secondary hyperparathyroidism of renal origin: Secondary | ICD-10-CM | POA: Diagnosis not present

## 2020-05-21 DIAGNOSIS — E875 Hyperkalemia: Secondary | ICD-10-CM | POA: Diagnosis not present

## 2020-05-21 DIAGNOSIS — D631 Anemia in chronic kidney disease: Secondary | ICD-10-CM | POA: Diagnosis not present

## 2020-05-21 DIAGNOSIS — N184 Chronic kidney disease, stage 4 (severe): Secondary | ICD-10-CM | POA: Diagnosis not present

## 2020-05-21 DIAGNOSIS — I129 Hypertensive chronic kidney disease with stage 1 through stage 4 chronic kidney disease, or unspecified chronic kidney disease: Secondary | ICD-10-CM | POA: Diagnosis not present

## 2020-05-21 DIAGNOSIS — R809 Proteinuria, unspecified: Secondary | ICD-10-CM | POA: Diagnosis not present

## 2020-05-24 ENCOUNTER — Other Ambulatory Visit: Payer: Self-pay | Admitting: Specialist

## 2020-05-24 DIAGNOSIS — R0602 Shortness of breath: Secondary | ICD-10-CM

## 2020-05-24 DIAGNOSIS — J9811 Atelectasis: Secondary | ICD-10-CM | POA: Diagnosis not present

## 2020-05-24 DIAGNOSIS — R053 Chronic cough: Secondary | ICD-10-CM | POA: Diagnosis not present

## 2020-05-24 DIAGNOSIS — R062 Wheezing: Secondary | ICD-10-CM | POA: Diagnosis not present

## 2020-05-24 DIAGNOSIS — J986 Disorders of diaphragm: Secondary | ICD-10-CM

## 2020-05-24 DIAGNOSIS — R059 Cough, unspecified: Secondary | ICD-10-CM | POA: Diagnosis not present

## 2020-05-29 ENCOUNTER — Ambulatory Visit
Admission: RE | Admit: 2020-05-29 | Discharge: 2020-05-29 | Disposition: A | Discharge status: Home / Self Care | Payer: Medicare HMO | Source: Ambulatory Visit | Attending: Specialist | Admitting: Specialist

## 2020-05-29 ENCOUNTER — Other Ambulatory Visit: Payer: Self-pay

## 2020-05-29 DIAGNOSIS — R0602 Shortness of breath: Secondary | ICD-10-CM | POA: Diagnosis not present

## 2020-05-29 DIAGNOSIS — J986 Disorders of diaphragm: Secondary | ICD-10-CM | POA: Diagnosis not present

## 2020-06-21 DIAGNOSIS — R0600 Dyspnea, unspecified: Secondary | ICD-10-CM | POA: Diagnosis not present

## 2020-06-21 DIAGNOSIS — R059 Cough, unspecified: Secondary | ICD-10-CM | POA: Diagnosis not present

## 2020-06-21 DIAGNOSIS — J45998 Other asthma: Secondary | ICD-10-CM | POA: Diagnosis not present

## 2020-09-04 DIAGNOSIS — R809 Proteinuria, unspecified: Secondary | ICD-10-CM | POA: Diagnosis not present

## 2020-09-04 DIAGNOSIS — N2581 Secondary hyperparathyroidism of renal origin: Secondary | ICD-10-CM | POA: Diagnosis not present

## 2020-09-04 DIAGNOSIS — N184 Chronic kidney disease, stage 4 (severe): Secondary | ICD-10-CM | POA: Diagnosis not present

## 2020-09-04 DIAGNOSIS — E875 Hyperkalemia: Secondary | ICD-10-CM | POA: Diagnosis not present

## 2020-09-04 DIAGNOSIS — D631 Anemia in chronic kidney disease: Secondary | ICD-10-CM | POA: Diagnosis not present

## 2020-09-04 DIAGNOSIS — I129 Hypertensive chronic kidney disease with stage 1 through stage 4 chronic kidney disease, or unspecified chronic kidney disease: Secondary | ICD-10-CM | POA: Diagnosis not present

## 2020-10-22 DIAGNOSIS — J4520 Mild intermittent asthma, uncomplicated: Secondary | ICD-10-CM | POA: Diagnosis not present

## 2020-10-22 DIAGNOSIS — E7800 Pure hypercholesterolemia, unspecified: Secondary | ICD-10-CM | POA: Diagnosis not present

## 2020-10-22 DIAGNOSIS — K219 Gastro-esophageal reflux disease without esophagitis: Secondary | ICD-10-CM | POA: Diagnosis not present

## 2020-10-22 DIAGNOSIS — M159 Polyosteoarthritis, unspecified: Secondary | ICD-10-CM | POA: Diagnosis not present

## 2020-10-22 DIAGNOSIS — I5032 Chronic diastolic (congestive) heart failure: Secondary | ICD-10-CM | POA: Diagnosis not present

## 2020-10-22 DIAGNOSIS — I130 Hypertensive heart and chronic kidney disease with heart failure and stage 1 through stage 4 chronic kidney disease, or unspecified chronic kidney disease: Secondary | ICD-10-CM | POA: Diagnosis not present

## 2020-10-22 DIAGNOSIS — M1A3710 Chronic gout due to renal impairment, right ankle and foot, without tophus (tophi): Secondary | ICD-10-CM | POA: Diagnosis not present

## 2020-10-22 DIAGNOSIS — D72829 Elevated white blood cell count, unspecified: Secondary | ICD-10-CM | POA: Diagnosis not present

## 2020-10-22 DIAGNOSIS — R058 Other specified cough: Secondary | ICD-10-CM | POA: Diagnosis not present

## 2020-10-22 DIAGNOSIS — N184 Chronic kidney disease, stage 4 (severe): Secondary | ICD-10-CM | POA: Diagnosis not present

## 2020-10-22 DIAGNOSIS — R059 Cough, unspecified: Secondary | ICD-10-CM | POA: Diagnosis not present

## 2020-10-22 DIAGNOSIS — R7302 Impaired glucose tolerance (oral): Secondary | ICD-10-CM | POA: Diagnosis not present

## 2020-12-04 DIAGNOSIS — I129 Hypertensive chronic kidney disease with stage 1 through stage 4 chronic kidney disease, or unspecified chronic kidney disease: Secondary | ICD-10-CM | POA: Diagnosis not present

## 2020-12-04 DIAGNOSIS — D631 Anemia in chronic kidney disease: Secondary | ICD-10-CM | POA: Diagnosis not present

## 2020-12-04 DIAGNOSIS — N184 Chronic kidney disease, stage 4 (severe): Secondary | ICD-10-CM | POA: Diagnosis not present

## 2020-12-04 DIAGNOSIS — R809 Proteinuria, unspecified: Secondary | ICD-10-CM | POA: Diagnosis not present

## 2020-12-04 DIAGNOSIS — E875 Hyperkalemia: Secondary | ICD-10-CM | POA: Diagnosis not present

## 2020-12-04 DIAGNOSIS — N2581 Secondary hyperparathyroidism of renal origin: Secondary | ICD-10-CM | POA: Diagnosis not present

## 2021-01-04 DIAGNOSIS — N6325 Unspecified lump in the left breast, overlapping quadrants: Secondary | ICD-10-CM | POA: Diagnosis not present

## 2021-01-14 ENCOUNTER — Other Ambulatory Visit: Payer: Self-pay | Admitting: General Surgery

## 2021-01-14 DIAGNOSIS — N6322 Unspecified lump in the left breast, upper inner quadrant: Secondary | ICD-10-CM

## 2021-01-23 ENCOUNTER — Other Ambulatory Visit: Payer: Self-pay

## 2021-01-23 ENCOUNTER — Ambulatory Visit
Admission: RE | Admit: 2021-01-23 | Discharge: 2021-01-23 | Disposition: A | Discharge status: Home / Self Care | Payer: Medicare HMO | Source: Ambulatory Visit | Attending: General Surgery | Admitting: General Surgery

## 2021-01-23 ENCOUNTER — Other Ambulatory Visit: Payer: Self-pay | Admitting: General Surgery

## 2021-01-23 DIAGNOSIS — R928 Other abnormal and inconclusive findings on diagnostic imaging of breast: Secondary | ICD-10-CM

## 2021-01-23 DIAGNOSIS — N6322 Unspecified lump in the left breast, upper inner quadrant: Secondary | ICD-10-CM

## 2021-01-23 DIAGNOSIS — N6312 Unspecified lump in the right breast, upper inner quadrant: Secondary | ICD-10-CM

## 2021-01-23 DIAGNOSIS — R922 Inconclusive mammogram: Secondary | ICD-10-CM | POA: Diagnosis not present

## 2021-01-23 DIAGNOSIS — N6323 Unspecified lump in the left breast, lower outer quadrant: Secondary | ICD-10-CM | POA: Diagnosis not present

## 2021-01-23 DIAGNOSIS — N6320 Unspecified lump in the left breast, unspecified quadrant: Secondary | ICD-10-CM

## 2021-01-23 DIAGNOSIS — N6310 Unspecified lump in the right breast, unspecified quadrant: Secondary | ICD-10-CM

## 2021-01-23 DIAGNOSIS — N6321 Unspecified lump in the left breast, upper outer quadrant: Secondary | ICD-10-CM | POA: Diagnosis not present

## 2021-01-31 ENCOUNTER — Ambulatory Visit
Admission: RE | Admit: 2021-01-31 | Discharge: 2021-01-31 | Disposition: A | Discharge status: Home / Self Care | Payer: No Typology Code available for payment source | Source: Ambulatory Visit | Attending: General Surgery | Admitting: General Surgery

## 2021-01-31 ENCOUNTER — Other Ambulatory Visit: Payer: Self-pay

## 2021-01-31 DIAGNOSIS — R928 Other abnormal and inconclusive findings on diagnostic imaging of breast: Secondary | ICD-10-CM

## 2021-01-31 DIAGNOSIS — N6011 Diffuse cystic mastopathy of right breast: Secondary | ICD-10-CM | POA: Diagnosis not present

## 2021-01-31 DIAGNOSIS — N6310 Unspecified lump in the right breast, unspecified quadrant: Secondary | ICD-10-CM | POA: Diagnosis not present

## 2021-01-31 DIAGNOSIS — N6320 Unspecified lump in the left breast, unspecified quadrant: Secondary | ICD-10-CM | POA: Diagnosis not present

## 2021-01-31 DIAGNOSIS — N6312 Unspecified lump in the right breast, upper inner quadrant: Secondary | ICD-10-CM | POA: Diagnosis not present

## 2021-01-31 DIAGNOSIS — N6012 Diffuse cystic mastopathy of left breast: Secondary | ICD-10-CM | POA: Diagnosis not present

## 2021-01-31 DIAGNOSIS — N6321 Unspecified lump in the left breast, upper outer quadrant: Secondary | ICD-10-CM | POA: Diagnosis not present

## 2021-01-31 HISTORY — PX: BREAST BIOPSY: SHX20

## 2021-02-01 LAB — SURGICAL PATHOLOGY

## 2021-02-13 ENCOUNTER — Encounter: Payer: Self-pay | Admitting: *Deleted

## 2021-02-19 ENCOUNTER — Encounter: Payer: Self-pay | Admitting: Oncology

## 2021-02-19 ENCOUNTER — Other Ambulatory Visit: Payer: Self-pay

## 2021-02-19 ENCOUNTER — Inpatient Hospital Stay: Payer: No Typology Code available for payment source

## 2021-02-19 ENCOUNTER — Inpatient Hospital Stay: Payer: No Typology Code available for payment source | Attending: Oncology | Admitting: Oncology

## 2021-02-19 ENCOUNTER — Encounter (INDEPENDENT_AMBULATORY_CARE_PROVIDER_SITE_OTHER): Payer: Self-pay

## 2021-02-19 VITALS — BP 140/68 | HR 83 | Temp 97.8°F | Resp 18 | Wt 182.0 lb

## 2021-02-19 DIAGNOSIS — N6092 Unspecified benign mammary dysplasia of left breast: Secondary | ICD-10-CM | POA: Insufficient documentation

## 2021-02-19 DIAGNOSIS — Z840 Family history of diseases of the skin and subcutaneous tissue: Secondary | ICD-10-CM | POA: Insufficient documentation

## 2021-02-19 DIAGNOSIS — R5383 Other fatigue: Secondary | ICD-10-CM | POA: Insufficient documentation

## 2021-02-19 DIAGNOSIS — Z801 Family history of malignant neoplasm of trachea, bronchus and lung: Secondary | ICD-10-CM | POA: Diagnosis not present

## 2021-02-19 NOTE — Progress Notes (Signed)
Patient reports back and forth constipation and diarrhea, no appetite, nocturia, SOB, throat clearing, left abdominal pain.

## 2021-02-19 NOTE — Progress Notes (Signed)
Hematology/Oncology Consult note [facility] Telephone:([phone] [phone]  Patient Care Team: Sofie Hartigan, MD as PCP - General (Family Medicine) Herbert Pun, MD as Consulting Physician (General Surgery) Sindy Guadeloupe, MD as Consulting Physician (Oncology)   Name of the patient: Gloria Day  [id number]  Jul 16, 1928    Reason for referral-atypical lobular hyperplasia   Referring physician-Dr. Peyton Najjar  Date of visit: 02/19/21   History of presenting illness- Patient is a 88 year old female who saw Dr. Peyton Najjar in December 2022 with symptoms of mass in her left breast.  She has had a biopsy many years ago which was negative for malignancy.  Patient had a bilateral mammogram and ultrasound which showed a left breast mass at 3 o'clock position 8 cm from the nipple measuring 12 x 6 x 18 mm.  No axillary adenopathy.  Mass in the right breast possibly containing central calcification measuring 8 x 6 x 8 mm at 1 o'clock position.  She had bilateral breast biopsies.  Right breast biopsy showed fibrous cyst wall with benign simple epithelial lining.  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  Patient has been referred for the same  Patient is here with her her daughter.  Denies any specific discomfort at this time.  ECOG PS- 2-3  Pain scale- 0   Review of systems- Review of Systems  Constitutional:  Positive for malaise/fatigue. Negative for chills, fever and weight loss.  HENT:  Negative for congestion, ear discharge and nosebleeds.   Eyes:  Negative for blurred vision.  Respiratory:  Negative for cough, hemoptysis, sputum production, shortness of breath and wheezing.   Cardiovascular:  Negative for chest pain, palpitations, orthopnea and claudication.  Gastrointestinal:  Negative for abdominal pain, blood in stool, constipation, diarrhea, heartburn,  melena, nausea and vomiting.  Genitourinary:  Negative for dysuria, flank pain, frequency, hematuria and urgency.  Musculoskeletal:  Negative for back pain, joint pain and myalgias.  Skin:  Negative for rash.  Neurological:  Negative for dizziness, tingling, focal weakness, seizures, weakness and headaches.  Endo/Heme/Allergies:  Does not bruise/bleed easily.  Psychiatric/Behavioral:  Negative for depression and suicidal ideas. The patient does not have insomnia.    No Known Allergies  Patient Active Problem List   Diagnosis Date Noted   Sepsis due to Escherichia coli with acute renal failure without septic shock (HCC)    Acute diastolic CHF (congestive heart failure) ([place])    Acute kidney injury superimposed on CKD ([place])    Hypokalemia    Essential hypertension    Hyperlipidemia    UTI (urinary tract infection) 03/05/2020   Leukocytosis 12/18/2017     Past Medical History:  Diagnosis Date   Atypical lobular hyperplasia (ALH) of breast    Cataract cortical, senile    CHF (congestive heart failure) (HCC)    COPD (chronic obstructive pulmonary disease) ([place])    Diastolic CHF ([place])    Diverticulosis    Gout    Hypercholesterolemia    Hypertension    Mass overlapping multiple quadrants of left breast    OA (osteoarthritis)    Peripheral edema    Renal insufficiency      Past Surgical History:  Procedure Laterality Date   ABDOMINAL HYSTERECTOMY     BREAST BIOPSY Right 01/31/2021   ribbon clip   BREAST BIOPSY Left 01/31/2021   x clip   CATARACT EXTRACTION Bilateral 2015   Rt 10/12/13, Lt 11/02/13   CHOLECYSTECTOMY  colonoscopy     TONSILLECTOMY     TOTAL KNEE ARTHROPLASTY      Social History   Socioeconomic History   Marital status: Widowed    Spouse name: Not on file   Number of children: Not on file   Years of education: Not on file   Highest education level: Not on file  Occupational History   Not on file  Tobacco Use   Smoking status: Never    Smokeless tobacco: Never  Vaping Use   Vaping Use: Never used  Substance and Sexual Activity   Alcohol use: Never   Drug use: Never   Sexual activity: Not on file  Other Topics Concern   Not on file  Social History Narrative   Not on file   Social Determinants of Health   Financial Resource Strain: Not on file  Food Insecurity: Not on file  Transportation Needs: Not on file  Physical Activity: Not on file  Stress: Not on file  Social Connections: Not on file  Intimate Partner Violence: Not on file     Family History  Problem Relation Age of Onset   Heart attack Mother    Heart attack Father    Diabetes Sister    Skin cancer Brother    Lung cancer Brother    Day cancer Brother    Bone cancer Brother    Parkinson's disease Brother    Heart attack Brother      Current Outpatient Medications:    allopurinol (ZYLOPRIM) 100 MG tablet, Take 100 mg by mouth daily. , Disp: , Rfl:    calcitRIOL (ROCALTROL) 0.25 MCG capsule, Take 0.25 mcg by mouth daily. , Disp: , Rfl:    diltiazem (CARDIZEM CD) 240 MG 24 hr capsule, Take 1 capsule (240 mg total) by mouth daily., Disp: 30 capsule, Rfl: 0   Multiple Vitamins-Minerals (VISION VITAMINS PO), Take by mouth., Disp: , Rfl:    pantoprazole (PROTONIX) 40 MG tablet, Take 1 tablet (40 mg total) by mouth daily., Disp: 30 tablet, Rfl: 0   simvastatin (ZOCOR) 40 MG tablet, Take 40 mg by mouth daily at 6 PM., Disp: , Rfl:    vitamin B-12 (CYANOCOBALAMIN) 1000 MCG tablet, Take 1,000 mcg by mouth daily. , Disp: , Rfl:    Physical exam:  Vitals:   02/19/21 1343  BP: 140/68  Pulse: 83  Resp: 18  Temp: 97.8 F (36.6 C)  TempSrc: Tympanic  SpO2: 96%  Weight: 182 lb (82.6 kg)   Physical Exam Constitutional:      Comments: Sitting in a wheelchair and appears in no acute distress  Cardiovascular:     Rate and Rhythm: Normal rate and regular rhythm.     Heart sounds: Normal heart sounds.  Pulmonary:     Effort: Pulmonary effort is  normal.     Breath sounds: Normal breath sounds.  Abdominal:     General: Bowel sounds are normal.     Palpations: Abdomen is soft.  Skin:    General: Skin is warm and dry.  Neurological:     Mental Status: She is alert and oriented to person, place, and time.    Breast exam: There is a palpable pea-sized nodule roughly 1.5 cm in size palpable in the left breast at around 3 o'clock position.  No palpable left axillary adenopathy.  No palpable masses in the right breast or right axillary adenopathy.   CMP Latest Ref Rng & Units 03/14/2020  Glucose 70 - 99 mg/dL 101(H)  BUN 8 - 23 mg/dL 42(H)  Creatinine 0.44 - 1.00 mg/dL 1.98(H)  Sodium 135 - 145 mmol/L 139  Potassium 3.5 - 5.1 mmol/L 4.7  Chloride 98 - 111 mmol/L 110  CO2 22 - 32 mmol/L 23  Calcium 8.9 - 10.3 mg/dL 8.2(L)  Total Protein 6.5 - 8.1 g/dL -  Total Bilirubin 0.3 - 1.2 mg/dL -  Alkaline Phos 38 - 126 U/L -  AST 15 - 41 U/L -  ALT 0 - 44 U/L -   CBC Latest Ref Rng & Units 03/14/2020  WBC 4.0 - 10.5 K/uL 14.3(H)  Hemoglobin 12.0 - 15.0 g/dL 9.2(L)  Hematocrit 36.0 - 46.0 % 28.8(L)  Platelets 150 - 400 K/uL 210    No images are attached to the encounter.  US BREAST LTD UNI LEFT INC AXILLA  Result Date: 01/23/2021 CLINICAL DATA:  Palpable lump in the left breast. EXAM: DIGITAL DIAGNOSTIC BILATERAL MAMMOGRAM WITH TOMOSYNTHESIS AND CAD; ULTRASOUND RIGHT BREAST LIMITED; ULTRASOUND LEFT BREAST LIMITED TECHNIQUE: Bilateral digital diagnostic mammography and breast tomosynthesis was performed. The images were evaluated with computer-aided detection.; Targeted ultrasound examination of the right breast was performed; Targeted ultrasound examination of the left breast was performed. COMPARISON:  Previous exam(s). ACR Breast Density Category b: There are scattered areas of fibroglandular density. FINDINGS: The patient has multiple similar appearing bilateral breast masses, a benign pattern. There is a mass in the slightly medial  superior right breast which is not seen on previous studies. There is a mass in the lateral central left breast which represents a change. There is a mass in the region of the patient's left palpable lump which is similar in the interval. No other suspicious findings. There are 2 biopsy clips from previous benign biopsies on the left. Targeted ultrasound is performed, showing a mildly complicated cyst in the region of the patient's left-sided symptoms. There is a left breast mass at 3 o'clock, 8 cm from the nipple measuring 12 x 6 x 18 mm, likely correlating with the mammographically identified mass in this location, representing a change compared to previous studies. No axillary adenopathy on the left. There is a mass in the right breast, possibly containing a central calcification at 1 o'clock, 2 cm from the nipple measuring 8 x 6 x 8 mm, likely correlating with the mass described above in the right breast. No axillary adenopathy on the right. IMPRESSION: Indeterminate left breast mass at 3 o'clock. Indeterminate right breast mass at 1 o'clock. Complicated cyst correlating with the patient's palpable lump. No other abnormalities. RECOMMENDATION: Recommend ultrasound-guided biopsy of the bilateral indeterminate breast masses, at 3 o'clock on the left and 1 o'clock on the right. I have discussed the findings and recommendations with the patient. If applicable, a reminder letter will be sent to the patient regarding the next appointment. BI-RADS CATEGORY  4: Suspicious. Electronically Signed   By: Dorise Bullion III M.D.   On: 01/23/2021 11:43  US BREAST LTD UNI RIGHT INC AXILLA  Result Date: 01/23/2021 CLINICAL DATA:  Palpable lump in the left breast. EXAM: DIGITAL DIAGNOSTIC BILATERAL MAMMOGRAM WITH TOMOSYNTHESIS AND CAD; ULTRASOUND RIGHT BREAST LIMITED; ULTRASOUND LEFT BREAST LIMITED TECHNIQUE: Bilateral digital diagnostic mammography and breast tomosynthesis was performed. The images were evaluated with  computer-aided detection.; Targeted ultrasound examination of the right breast was performed; Targeted ultrasound examination of the left breast was performed. COMPARISON:  Previous exam(s). ACR Breast Density Category b: There are scattered areas of fibroglandular density. FINDINGS: The patient has multiple similar appearing bilateral  breast masses, a benign pattern. There is a mass in the slightly medial superior right breast which is not seen on previous studies. There is a mass in the lateral central left breast which represents a change. There is a mass in the region of the patient's left palpable lump which is similar in the interval. No other suspicious findings. There are 2 biopsy clips from previous benign biopsies on the left. Targeted ultrasound is performed, showing a mildly complicated cyst in the region of the patient's left-sided symptoms. There is a left breast mass at 3 o'clock, 8 cm from the nipple measuring 12 x 6 x 18 mm, likely correlating with the mammographically identified mass in this location, representing a change compared to previous studies. No axillary adenopathy on the left. There is a mass in the right breast, possibly containing a central calcification at 1 o'clock, 2 cm from the nipple measuring 8 x 6 x 8 mm, likely correlating with the mass described above in the right breast. No axillary adenopathy on the right. IMPRESSION: Indeterminate left breast mass at 3 o'clock. Indeterminate right breast mass at 1 o'clock. Complicated cyst correlating with the patient's palpable lump. No other abnormalities. RECOMMENDATION: Recommend ultrasound-guided biopsy of the bilateral indeterminate breast masses, at 3 o'clock on the left and 1 o'clock on the right. I have discussed the findings and recommendations with the patient. If applicable, a reminder letter will be sent to the patient regarding the next appointment. BI-RADS CATEGORY  4: Suspicious. Electronically Signed   By: Dorise Bullion  III M.D.   On: 01/23/2021 11:43  MM DIAG BREAST TOMO BILATERAL  Result Date: 01/23/2021 CLINICAL DATA:  Palpable lump in the left breast. EXAM: DIGITAL DIAGNOSTIC BILATERAL MAMMOGRAM WITH TOMOSYNTHESIS AND CAD; ULTRASOUND RIGHT BREAST LIMITED; ULTRASOUND LEFT BREAST LIMITED TECHNIQUE: Bilateral digital diagnostic mammography and breast tomosynthesis was performed. The images were evaluated with computer-aided detection.; Targeted ultrasound examination of the right breast was performed; Targeted ultrasound examination of the left breast was performed. COMPARISON:  Previous exam(s). ACR Breast Density Category b: There are scattered areas of fibroglandular density. FINDINGS: The patient has multiple similar appearing bilateral breast masses, a benign pattern. There is a mass in the slightly medial superior right breast which is not seen on previous studies. There is a mass in the lateral central left breast which represents a change. There is a mass in the region of the patient's left palpable lump which is similar in the interval. No other suspicious findings. There are 2 biopsy clips from previous benign biopsies on the left. Targeted ultrasound is performed, showing a mildly complicated cyst in the region of the patient's left-sided symptoms. There is a left breast mass at 3 o'clock, 8 cm from the nipple measuring 12 x 6 x 18 mm, likely correlating with the mammographically identified mass in this location, representing a change compared to previous studies. No axillary adenopathy on the left. There is a mass in the right breast, possibly containing a central calcification at 1 o'clock, 2 cm from the nipple measuring 8 x 6 x 8 mm, likely correlating with the mass described above in the right breast. No axillary adenopathy on the right. IMPRESSION: Indeterminate left breast mass at 3 o'clock. Indeterminate right breast mass at 1 o'clock. Complicated cyst correlating with the patient's palpable lump. No other  abnormalities. RECOMMENDATION: Recommend ultrasound-guided biopsy of the bilateral indeterminate breast masses, at 3 o'clock on the left and 1 o'clock on the right. I have discussed the findings and  recommendations with the patient. If applicable, a reminder letter will be sent to the patient regarding the next appointment. BI-RADS CATEGORY  4: Suspicious. Electronically Signed   By: Dorise Bullion III M.D.   On: 01/23/2021 11:43  MM CLIP PLACEMENT LEFT  Result Date: 01/31/2021 CLINICAL DATA:  Status post biopsy left breast mass. EXAM: 3D DIAGNOSTIC LEFT MAMMOGRAM POST ULTRASOUND BIOPSY COMPARISON:  Previous exam(s). FINDINGS: 3D Mammographic images were obtained following ultrasound guided biopsy of left breast mass. The biopsy marking clip is in expected position at the site of biopsy. IMPRESSION: Appropriate positioning of the X shaped biopsy marking clip at the site of biopsy in the outer left breast. Final Assessment: Post Procedure Mammograms for Marker Placement Electronically Signed   By: Lovey Newcomer M.D.   On: 01/31/2021 10:23  MM CLIP PLACEMENT RIGHT  Result Date: 01/31/2021 CLINICAL DATA:  Status post ultrasound-guided biopsy right breast mass. EXAM: 3D DIAGNOSTIC RIGHT MAMMOGRAM POST ULTRASOUND BIOPSY COMPARISON:  Previous exam(s). FINDINGS: 3D Mammographic images were obtained following ultrasound guided biopsy of right breast mass. The biopsy marking clip is in expected position at the site of biopsy. IMPRESSION: Appropriate positioning of the ribbon shaped biopsy marking clip at the site of biopsy in the upper inner right breast. Final Assessment: Post Procedure Mammograms for Marker Placement Electronically Signed   By: Lovey Newcomer M.D.   On: 01/31/2021 10:24  [place] LT BREAST BX W LOC DEV 1ST LESION IMG BX SPEC US GUIDE  Addendum Date: 02/04/2021   ADDENDUM REPORT: 02/04/2021 16:47 ADDENDUM: PATHOLOGY revealed: Site A. BREAST, RIGHT 1:00 2 CM FN; ULTRASOUND-GUIDED BIOPSY: - FRAGMENTS OF  FIBROUS CYST WALL WITH BLAND SIMPLE EPITHELIAL LINING. - NEGATIVE FOR ATYPIA AND MALIGNANCY. Pathology results are CONCORDANT with imaging findings, per Dr. Lovey Newcomer. PATHOLOGY revealed: Site B. BREAST, LEFT 3:00 8 CM FN; ULTRASOUND-GUIDED BIOPSY: - CYSTIC SPACES WITH BLAND EPITHELIAL LINING. - BACKGROUND BREAST TISSUE WITH PSEUDOANGIOMATOUS STROMAL HYPERPLASIA AND INCIDENTAL LOBULAR NEOPLASIA (ATYPICAL LOBULAR HYPERPLASIA). Pathology results are CONCORDANT with imaging findings, per Dr. Lovey Newcomer. Pathology results and recommendations below were discussed with patient and patient's daughter Andi Devon) by telephone on 02/01/2021. Patient reported biopsy site within normal limits with slight tenderness at the site. Post biopsy care instructions were reviewed, questions were answered and my direct phone number was provided to patient and daughter. Patient was instructed to call [facility] if any concerns or questions arise related to the biopsy. RECOMMENDATION: Patient instructed to return in six months for bilateral breast diagnostic mammogram and possible ultrasound to ensure stability of RIGHT breast mass and LEFT breast incidental atypical lobular hyperplasia. Patient informed a reminder notice will be sent regarding this appointment and she will need to call mammography site to schedule this appointment. Pathology results reported by Electa Sniff RN on 02/04/2021. Electronically Signed   By: Lovey Newcomer M.D.   On: 02/04/2021 16:47   Result Date: 02/04/2021 CLINICAL DATA:  Patient with indeterminate left breast mass 3 o'clock position. EXAM: ULTRASOUND GUIDED LEFT BREAST CORE NEEDLE BIOPSY COMPARISON:  Previous exam(s). PROCEDURE: I met with the patient and we discussed the procedure of ultrasound-guided biopsy, including benefits and alternatives. We discussed the high likelihood of a successful procedure. We discussed the risks of the procedure, including infection, bleeding, tissue injury,  clip migration, and inadequate sampling. Informed written consent was given. The usual time-out protocol was performed immediately prior to the procedure. Lesion quadrant: Upper outer quadrant Using sterile technique and 1% Lidocaine as local anesthetic,  under direct ultrasound visualization, a 14 gauge spring-loaded device was used to perform biopsy of left breast mass 3 o'clock position using a lateral approach. At the conclusion of the procedure X shaped tissue marker clip was deployed into the biopsy cavity. Follow up 2 view mammogram was performed and dictated separately. IMPRESSION: Ultrasound guided biopsy of left breast mass 3 o'clock position. No apparent complications. Electronically Signed: By: Lovey Newcomer M.D. On: 01/31/2021 10:23   [place] RT BREAST BX W LOC DEV 1ST LESION IMG BX SPEC US GUIDE  Addendum Date: 02/04/2021   ADDENDUM REPORT: 02/04/2021 16:47 ADDENDUM: PATHOLOGY revealed: Site A. BREAST, RIGHT 1:00 2 CM FN; ULTRASOUND-GUIDED BIOPSY: - FRAGMENTS OF FIBROUS CYST WALL WITH BLAND SIMPLE EPITHELIAL LINING. - NEGATIVE FOR ATYPIA AND MALIGNANCY. Pathology results are CONCORDANT with imaging findings, per Dr. Lovey Newcomer. PATHOLOGY revealed: Site B. BREAST, LEFT 3:00 8 CM FN; ULTRASOUND-GUIDED BIOPSY: - CYSTIC SPACES WITH BLAND EPITHELIAL LINING. - BACKGROUND BREAST TISSUE WITH PSEUDOANGIOMATOUS STROMAL HYPERPLASIA AND INCIDENTAL LOBULAR NEOPLASIA (ATYPICAL LOBULAR HYPERPLASIA). Pathology results are CONCORDANT with imaging findings, per Dr. Lovey Newcomer. Pathology results and recommendations below were discussed with patient and patient's daughter Andi Devon) by telephone on 02/01/2021. Patient reported biopsy site within normal limits with slight tenderness at the site. Post biopsy care instructions were reviewed, questions were answered and my direct phone number was provided to patient and daughter. Patient was instructed to call [facility] if any concerns or questions arise  related to the biopsy. RECOMMENDATION: Patient instructed to return in six months for bilateral breast diagnostic mammogram and possible ultrasound to ensure stability of RIGHT breast mass and LEFT breast incidental atypical lobular hyperplasia. Patient informed a reminder notice will be sent regarding this appointment and she will need to call mammography site to schedule this appointment. Pathology results reported by Electa Sniff RN on 02/04/2021. Electronically Signed   By: Lovey Newcomer M.D.   On: 02/04/2021 16:47   Result Date: 02/04/2021 CLINICAL DATA:  Patient with indeterminate right breast mass 1 o'clock position EXAM: ULTRASOUND GUIDED RIGHT BREAST CORE NEEDLE BIOPSY COMPARISON:  Previous exam(s). PROCEDURE: I met with the patient and we discussed the procedure of ultrasound-guided biopsy, including benefits and alternatives. We discussed the high likelihood of a successful procedure. We discussed the risks of the procedure, including infection, bleeding, tissue injury, clip migration, and inadequate sampling. Informed written consent was given. The usual time-out protocol was performed immediately prior to the procedure. Lesion quadrant: Upper inner quadrant Using sterile technique and 1% Lidocaine as local anesthetic, under direct ultrasound visualization, a 14 gauge spring-loaded device was used to perform biopsy of right breast mass 1 o'clock position using a lateral approach. At the conclusion of the procedure ribbon shaped tissue marker clip was deployed into the biopsy cavity. Follow up 2 view mammogram was performed and dictated separately. IMPRESSION: Ultrasound guided biopsy of right breast mass 1 o'clock position. No apparent complications. Electronically Signed: By: Lovey Newcomer M.D. On: 01/31/2021 10:22    Assessment and plan- Patient is a 88 y.o. female with small palpable left breast mass that was biopsy-provenPseudo angiomatous stromal hyperplasia and lobular neoplasia referred for further  management  Discussed the results of her bilateral breast biopsy with patient and her daughter.  Right breast biopsy did not show any evidence of malignancy.  Left breast biopsy showed pseudo angiomatous stromal hyperplasia which is nonmalignant but there was incidental atypical lobular hyperplasia noted.  This as such does not need to be surgically  excised.  The risk of progression to overt invasive carcinoma from atypical lobular hyperplasia is less than 3%. Atypical hyperplasia does confer an increase in risk of subsequent breast cancer.  Although chemoprevention can be considered at a younger age I doubt that it would add any meaningful benefit at the age of 88 years.  It has not been shown to improve any overall survival.  Treatment with aromatase inhibitors at her age can be associated with side effects such as hot flashes, mood swings, arthralgias and worsening bone health.  I therefore do not recommend chemoprevention with aromatase inhibitors for her.  Patient can continue to follow-up with Dr. Peyton Najjar and get surveillance mammograms as well as breast exams through him.  Mammogram was recommended for her at 6 months by radiology which can be coordinated by surgery.  Patient does not require follow-up with me.  Patient and her daughter verbalized understanding of the plan   Thank you for this kind referral and the opportunity to participate in the care of this patient   Visit Diagnosis 1. Atypical lobular hyperplasia [facility]) of left breast     Dr. Randa Evens, MD, MPH [facility] at [facility] [phone] 02/19/2021

## 2021-03-11 DIAGNOSIS — N2581 Secondary hyperparathyroidism of renal origin: Secondary | ICD-10-CM | POA: Diagnosis not present

## 2021-03-11 DIAGNOSIS — I129 Hypertensive chronic kidney disease with stage 1 through stage 4 chronic kidney disease, or unspecified chronic kidney disease: Secondary | ICD-10-CM | POA: Diagnosis not present

## 2021-03-11 DIAGNOSIS — E875 Hyperkalemia: Secondary | ICD-10-CM | POA: Diagnosis not present

## 2021-03-11 DIAGNOSIS — N184 Chronic kidney disease, stage 4 (severe): Secondary | ICD-10-CM | POA: Diagnosis not present

## 2021-03-11 DIAGNOSIS — D631 Anemia in chronic kidney disease: Secondary | ICD-10-CM | POA: Diagnosis not present

## 2021-03-11 DIAGNOSIS — R809 Proteinuria, unspecified: Secondary | ICD-10-CM | POA: Diagnosis not present

## 2021-04-12 DIAGNOSIS — Z0000 Encounter for general adult medical examination without abnormal findings: Secondary | ICD-10-CM | POA: Diagnosis not present

## 2021-04-12 DIAGNOSIS — M159 Polyosteoarthritis, unspecified: Secondary | ICD-10-CM | POA: Diagnosis not present

## 2021-04-12 DIAGNOSIS — Z1389 Encounter for screening for other disorder: Secondary | ICD-10-CM | POA: Diagnosis not present

## 2021-04-12 DIAGNOSIS — N184 Chronic kidney disease, stage 4 (severe): Secondary | ICD-10-CM | POA: Diagnosis not present

## 2021-04-12 DIAGNOSIS — I130 Hypertensive heart and chronic kidney disease with heart failure and stage 1 through stage 4 chronic kidney disease, or unspecified chronic kidney disease: Secondary | ICD-10-CM | POA: Diagnosis not present

## 2021-04-12 DIAGNOSIS — M1A3710 Chronic gout due to renal impairment, right ankle and foot, without tophus (tophi): Secondary | ICD-10-CM | POA: Diagnosis not present

## 2021-04-12 DIAGNOSIS — E7800 Pure hypercholesterolemia, unspecified: Secondary | ICD-10-CM | POA: Diagnosis not present

## 2021-04-12 DIAGNOSIS — F32A Depression, unspecified: Secondary | ICD-10-CM | POA: Diagnosis not present

## 2021-04-12 DIAGNOSIS — D72829 Elevated white blood cell count, unspecified: Secondary | ICD-10-CM | POA: Diagnosis not present

## 2021-04-12 DIAGNOSIS — R7302 Impaired glucose tolerance (oral): Secondary | ICD-10-CM | POA: Diagnosis not present

## 2021-04-12 DIAGNOSIS — K219 Gastro-esophageal reflux disease without esophagitis: Secondary | ICD-10-CM | POA: Diagnosis not present

## 2021-04-12 DIAGNOSIS — I5032 Chronic diastolic (congestive) heart failure: Secondary | ICD-10-CM | POA: Diagnosis not present

## 2021-04-12 DIAGNOSIS — N2581 Secondary hyperparathyroidism of renal origin: Secondary | ICD-10-CM | POA: Diagnosis not present

## 2021-04-12 DIAGNOSIS — J449 Chronic obstructive pulmonary disease, unspecified: Secondary | ICD-10-CM | POA: Diagnosis not present

## 2021-04-30 ENCOUNTER — Other Ambulatory Visit: Payer: Self-pay | Admitting: General Surgery

## 2021-04-30 DIAGNOSIS — Z853 Personal history of malignant neoplasm of breast: Secondary | ICD-10-CM

## 2021-06-11 DIAGNOSIS — R600 Localized edema: Secondary | ICD-10-CM | POA: Diagnosis not present

## 2021-06-11 DIAGNOSIS — N184 Chronic kidney disease, stage 4 (severe): Secondary | ICD-10-CM | POA: Diagnosis not present

## 2021-08-01 ENCOUNTER — Inpatient Hospital Stay: Admission: RE | Admit: 2021-08-01 | Payer: No Typology Code available for payment source | Source: Ambulatory Visit

## 2021-08-01 ENCOUNTER — Other Ambulatory Visit: Payer: No Typology Code available for payment source

## 2022-04-09 DIAGNOSIS — R7302 Impaired glucose tolerance (oral): Secondary | ICD-10-CM | POA: Diagnosis not present

## 2022-04-09 DIAGNOSIS — K219 Gastro-esophageal reflux disease without esophagitis: Secondary | ICD-10-CM | POA: Diagnosis not present

## 2022-04-09 DIAGNOSIS — F0630 Mood disorder due to known physiological condition, unspecified: Secondary | ICD-10-CM | POA: Diagnosis not present

## 2022-04-09 DIAGNOSIS — M1A3710 Chronic gout due to renal impairment, right ankle and foot, without tophus (tophi): Secondary | ICD-10-CM | POA: Diagnosis not present

## 2022-04-09 DIAGNOSIS — J449 Chronic obstructive pulmonary disease, unspecified: Secondary | ICD-10-CM | POA: Diagnosis not present

## 2022-04-09 DIAGNOSIS — M159 Polyosteoarthritis, unspecified: Secondary | ICD-10-CM | POA: Diagnosis not present

## 2022-04-09 DIAGNOSIS — I10 Essential (primary) hypertension: Secondary | ICD-10-CM | POA: Diagnosis not present

## 2022-04-09 DIAGNOSIS — N184 Chronic kidney disease, stage 4 (severe): Secondary | ICD-10-CM | POA: Diagnosis not present

## 2022-04-09 DIAGNOSIS — I5032 Chronic diastolic (congestive) heart failure: Secondary | ICD-10-CM | POA: Diagnosis not present

## 2022-04-09 DIAGNOSIS — Z1331 Encounter for screening for depression: Secondary | ICD-10-CM | POA: Diagnosis not present

## 2022-04-09 DIAGNOSIS — N2581 Secondary hyperparathyroidism of renal origin: Secondary | ICD-10-CM | POA: Diagnosis not present

## 2022-04-09 DIAGNOSIS — E7800 Pure hypercholesterolemia, unspecified: Secondary | ICD-10-CM | POA: Diagnosis not present

## 2022-04-09 DIAGNOSIS — Z0000 Encounter for general adult medical examination without abnormal findings: Secondary | ICD-10-CM | POA: Diagnosis not present

## 2022-04-24 DIAGNOSIS — J449 Chronic obstructive pulmonary disease, unspecified: Secondary | ICD-10-CM | POA: Diagnosis not present

## 2022-04-24 DIAGNOSIS — N184 Chronic kidney disease, stage 4 (severe): Secondary | ICD-10-CM | POA: Diagnosis not present

## 2022-04-24 DIAGNOSIS — I5032 Chronic diastolic (congestive) heart failure: Secondary | ICD-10-CM | POA: Diagnosis not present

## 2022-04-24 DIAGNOSIS — E7800 Pure hypercholesterolemia, unspecified: Secondary | ICD-10-CM | POA: Diagnosis not present

## 2022-04-24 DIAGNOSIS — I10 Essential (primary) hypertension: Secondary | ICD-10-CM | POA: Diagnosis not present

## 2022-04-24 DIAGNOSIS — M159 Polyosteoarthritis, unspecified: Secondary | ICD-10-CM | POA: Diagnosis not present

## 2022-05-09 IMAGING — CT CT ABD-PELV W/O CM
2 of 5 series · 16 of 46 positions shown, 18 images · non-contrast
Comparison: 07/27/2017, 07/19/2019

CLINICAL DATA: Nausea/vomiting/diarrhea, pain for several days

EXAM:
CT ABDOMEN AND PELVIS WITHOUT CONTRAST
TECHNIQUE: Multidetector CT imaging of the abdomen and pelvis was performed
following the standard protocol without IV contrast.

[Series 2: routine abd/pel wo · axial · 0.98mm/px · z∈[-414,-4]mm · 13 of 92 slices shown, 15 images]
[im 5/92  soft-tissue]
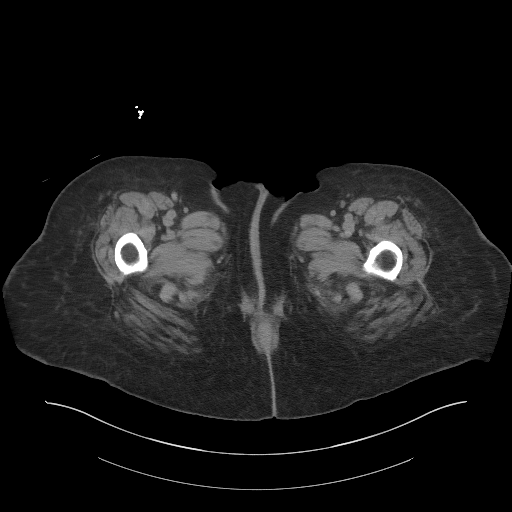
[im 5/92  bone]
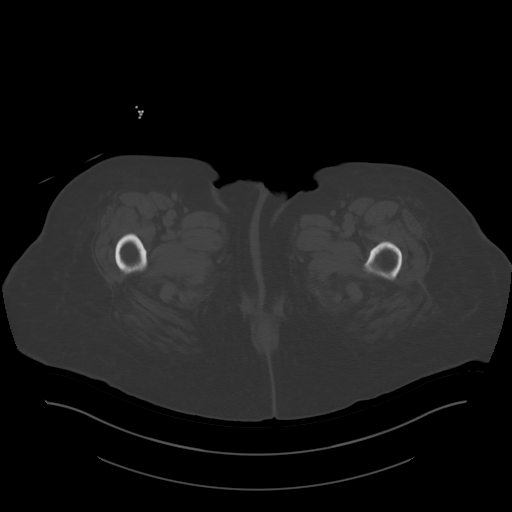
[im 14/92  soft-tissue]
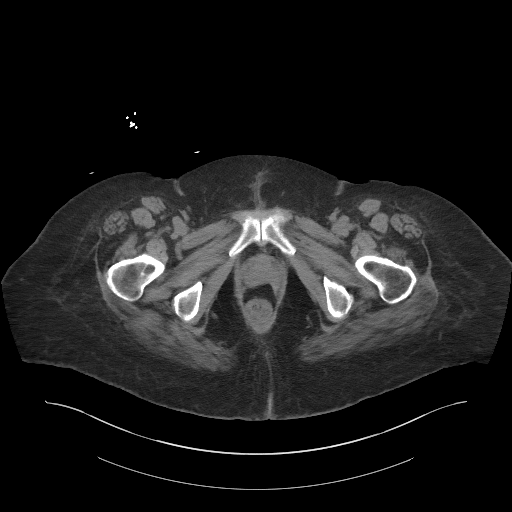
[im 19/92  soft-tissue]
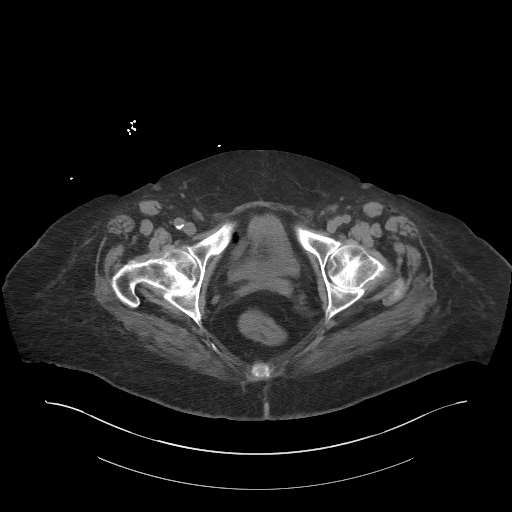
[im 28/92  soft-tissue]
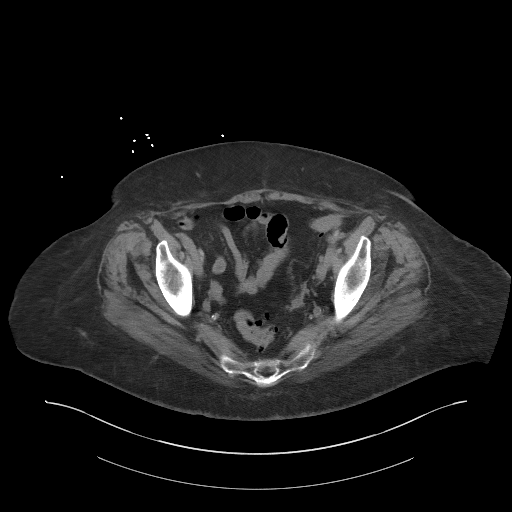
[im 32/92  soft-tissue]
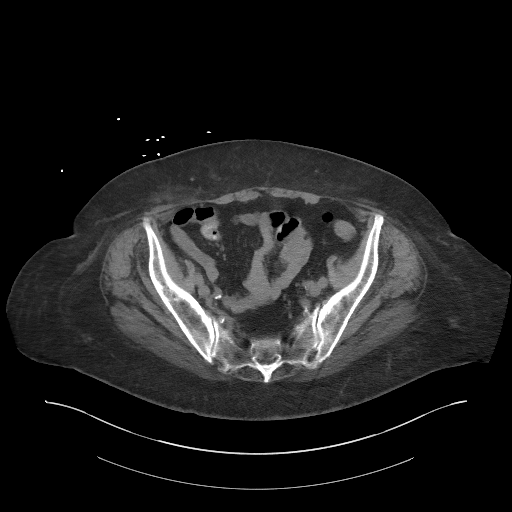
[im 41/92  soft-tissue]
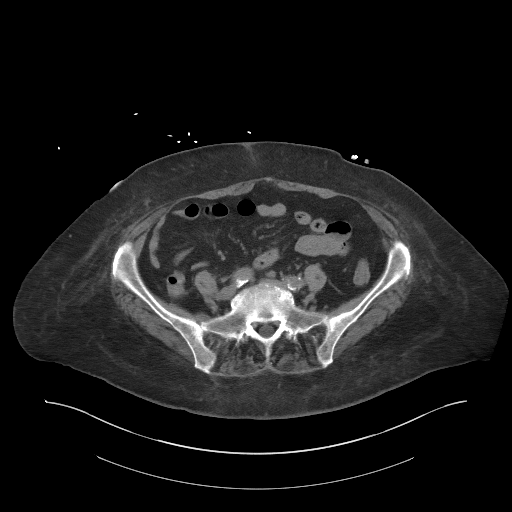
[im 46/92  soft-tissue]
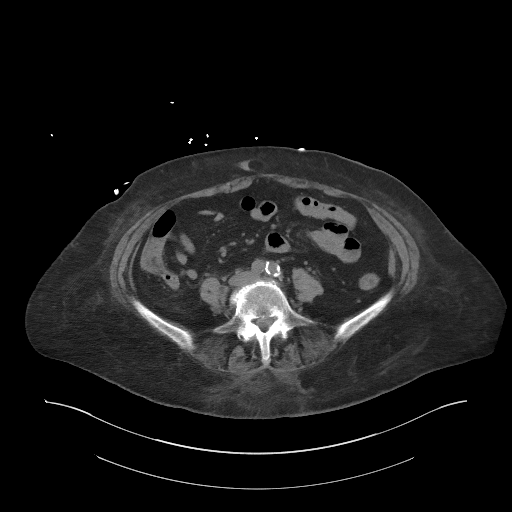
[im 51/92  soft-tissue]
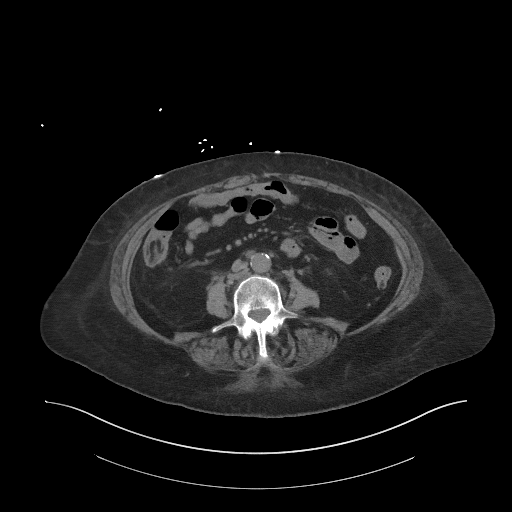
[im 60/92  soft-tissue]
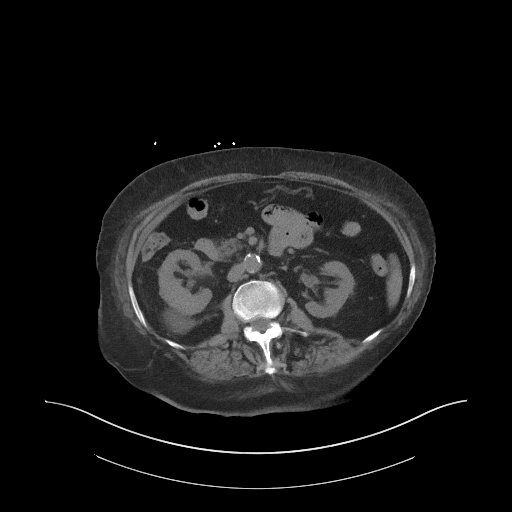
[im 60/92  bone]
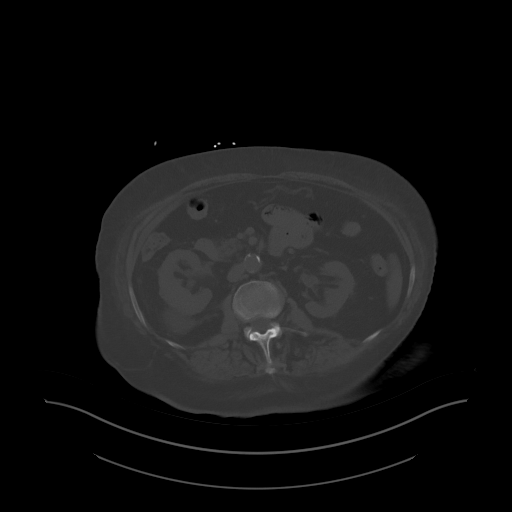
[im 64/92  soft-tissue]
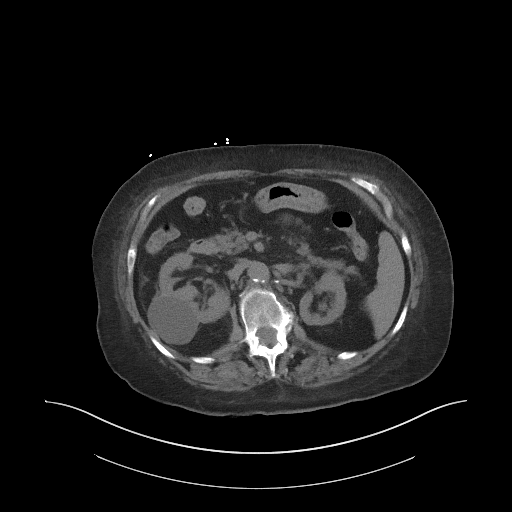
[im 73/92  soft-tissue]
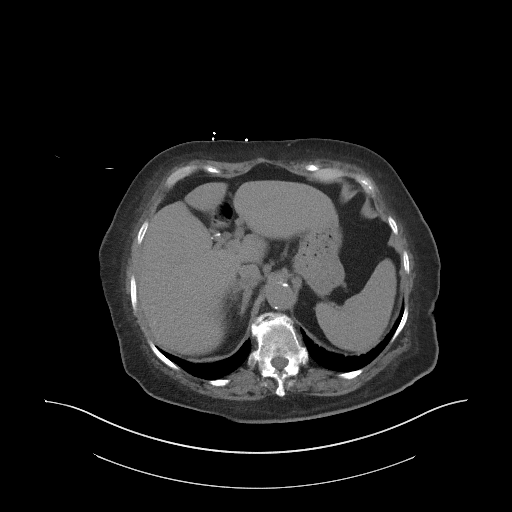
[im 78/92  soft-tissue]
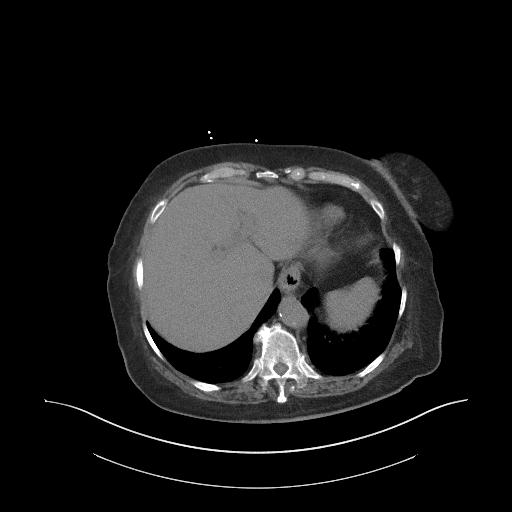
[im 87/92  soft-tissue]
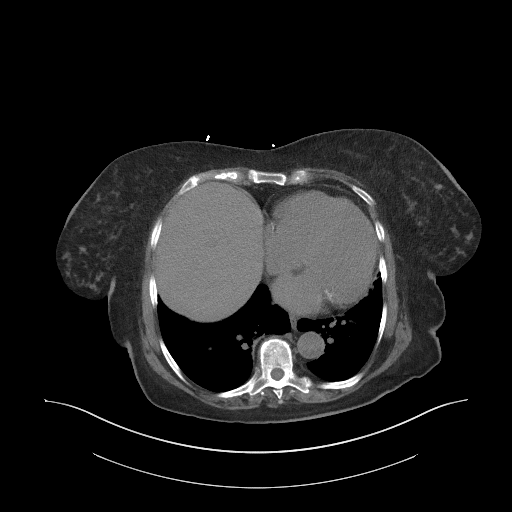

[Series 6: coronal st · coronal · 0.84mm/px · 3 of 94 slices shown]
[im 32/94  soft-tissue]
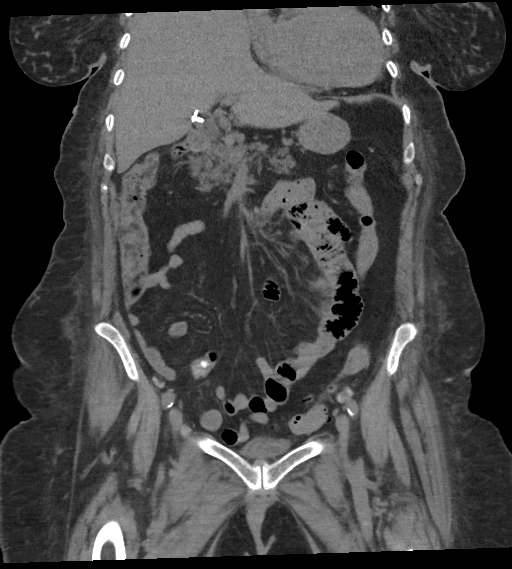
[im 42/94  soft-tissue]
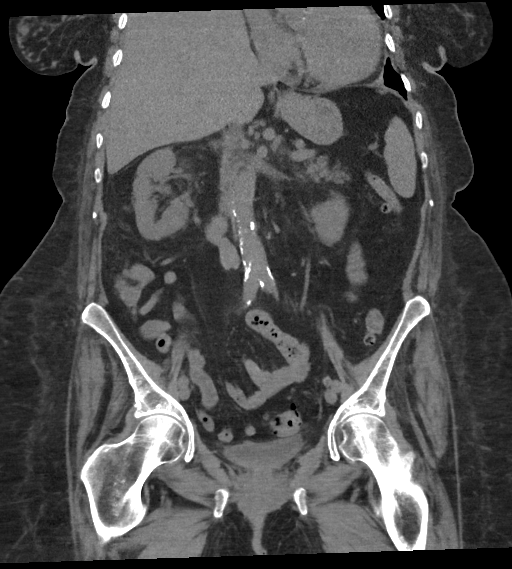
[im 52/94  soft-tissue]
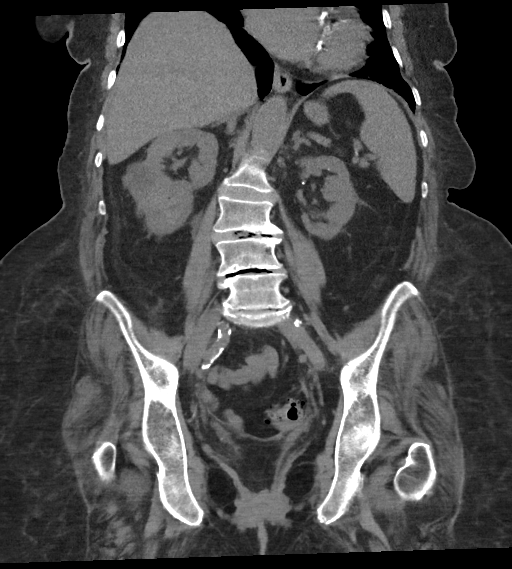

[16 of 46 positions shown; findings below may reference images not displayed]

FINDINGS: Lower chest: No acute pleural or parenchymal lung disease.

Unenhanced CT was performed per clinician order. Lack of IV contrast
limits sensitivity and specificity, especially for evaluation of
abdominal/pelvic solid viscera.

Hepatobiliary: No focal liver abnormality is seen. Status post
cholecystectomy. No biliary dilatation.

Pancreas: Unremarkable. No pancreatic ductal dilatation or
surrounding inflammatory changes. Known pancreatic cysts, largest in
the uncinate process, seen on previous studies are not well
visualized on this unenhanced exam.

Spleen: Normal in size without focal abnormality.

Adrenals/Urinary Tract: Stable bilateral adrenal glands. Exophytic
cyst right kidney unchanged. No urinary tract calculi or obstructive
uropathy within either kidney. The bladder is decompressed, which
limits its evaluation.

Stomach/Bowel: No bowel obstruction or ileus. There is
diverticulosis of the descending and sigmoid colon. Wall thickening
of the mid sigmoid colon likely reflects postinflammatory scarring.
I do not see any signs of active inflammation on this exam.

Vascular/Lymphatic: Stable atherosclerosis of the abdominal aorta
and its branches. Multiple borderline enlarged retroperitoneal lymph
nodes are unchanged and likely reactive. Largest measures 10 mm in
the aortocaval region.

Reproductive: Status post hysterectomy. No adnexal masses.

Other: No free fluid or free gas. Small fat containing umbilical
hernia.

Musculoskeletal: No acute or destructive bony lesions. Reconstructed
images demonstrate no additional findings.
IMPRESSION: 1. No urinary tract calculi or obstructive uropathy.
2. Diverticulosis of the descending and sigmoid colon. Wall
thickening of the mid sigmoid colon likely reflects postinflammatory
scarring. I do not see any signs of active inflammation on this
exam.
3. Known pancreatic cysts, largest in the uncinate process, seen on
previous studies are not well visualized on this unenhanced exam.
Please refer to MRI discussion 07/19/2019 which recommended 2 year
follow-up MRI.
4. Aortic Atherosclerosis ([id number]).

## 2022-06-04 DIAGNOSIS — I129 Hypertensive chronic kidney disease with stage 1 through stage 4 chronic kidney disease, or unspecified chronic kidney disease: Secondary | ICD-10-CM | POA: Diagnosis not present

## 2022-06-04 DIAGNOSIS — E875 Hyperkalemia: Secondary | ICD-10-CM | POA: Diagnosis not present

## 2022-06-04 DIAGNOSIS — R809 Proteinuria, unspecified: Secondary | ICD-10-CM | POA: Diagnosis not present

## 2022-06-04 DIAGNOSIS — N184 Chronic kidney disease, stage 4 (severe): Secondary | ICD-10-CM | POA: Diagnosis not present

## 2022-06-04 DIAGNOSIS — N2581 Secondary hyperparathyroidism of renal origin: Secondary | ICD-10-CM | POA: Diagnosis not present

## 2022-06-04 DIAGNOSIS — D631 Anemia in chronic kidney disease: Secondary | ICD-10-CM | POA: Diagnosis not present

## 2022-06-27 DIAGNOSIS — I5032 Chronic diastolic (congestive) heart failure: Secondary | ICD-10-CM | POA: Diagnosis not present

## 2022-06-27 DIAGNOSIS — J449 Chronic obstructive pulmonary disease, unspecified: Secondary | ICD-10-CM | POA: Diagnosis not present

## 2022-06-27 DIAGNOSIS — N184 Chronic kidney disease, stage 4 (severe): Secondary | ICD-10-CM | POA: Diagnosis not present

## 2022-06-27 DIAGNOSIS — M159 Polyosteoarthritis, unspecified: Secondary | ICD-10-CM | POA: Diagnosis not present

## 2022-06-27 DIAGNOSIS — E7800 Pure hypercholesterolemia, unspecified: Secondary | ICD-10-CM | POA: Diagnosis not present

## 2022-10-22 DIAGNOSIS — K219 Gastro-esophageal reflux disease without esophagitis: Secondary | ICD-10-CM | POA: Diagnosis not present

## 2022-10-22 DIAGNOSIS — R202 Paresthesia of skin: Secondary | ICD-10-CM | POA: Diagnosis not present

## 2022-10-22 DIAGNOSIS — N2581 Secondary hyperparathyroidism of renal origin: Secondary | ICD-10-CM | POA: Diagnosis not present

## 2022-10-22 DIAGNOSIS — J449 Chronic obstructive pulmonary disease, unspecified: Secondary | ICD-10-CM | POA: Diagnosis not present

## 2022-10-22 DIAGNOSIS — M1A3710 Chronic gout due to renal impairment, right ankle and foot, without tophus (tophi): Secondary | ICD-10-CM | POA: Diagnosis not present

## 2022-10-22 DIAGNOSIS — E7800 Pure hypercholesterolemia, unspecified: Secondary | ICD-10-CM | POA: Diagnosis not present

## 2022-10-22 DIAGNOSIS — M159 Polyosteoarthritis, unspecified: Secondary | ICD-10-CM | POA: Diagnosis not present

## 2022-10-22 DIAGNOSIS — I10 Essential (primary) hypertension: Secondary | ICD-10-CM | POA: Diagnosis not present

## 2022-10-22 DIAGNOSIS — R7302 Impaired glucose tolerance (oral): Secondary | ICD-10-CM | POA: Diagnosis not present

## 2022-10-22 DIAGNOSIS — I5032 Chronic diastolic (congestive) heart failure: Secondary | ICD-10-CM | POA: Diagnosis not present

## 2022-10-22 DIAGNOSIS — F0630 Mood disorder due to known physiological condition, unspecified: Secondary | ICD-10-CM | POA: Diagnosis not present

## 2022-10-22 DIAGNOSIS — N184 Chronic kidney disease, stage 4 (severe): Secondary | ICD-10-CM | POA: Diagnosis not present

## 2022-10-27 DIAGNOSIS — R202 Paresthesia of skin: Secondary | ICD-10-CM | POA: Diagnosis not present

## 2022-10-27 DIAGNOSIS — E7800 Pure hypercholesterolemia, unspecified: Secondary | ICD-10-CM | POA: Diagnosis not present

## 2022-10-27 DIAGNOSIS — R7302 Impaired glucose tolerance (oral): Secondary | ICD-10-CM | POA: Diagnosis not present

## 2022-11-07 DIAGNOSIS — J449 Chronic obstructive pulmonary disease, unspecified: Secondary | ICD-10-CM | POA: Diagnosis not present

## 2022-11-07 DIAGNOSIS — I10 Essential (primary) hypertension: Secondary | ICD-10-CM | POA: Diagnosis not present

## 2022-11-07 DIAGNOSIS — M159 Polyosteoarthritis, unspecified: Secondary | ICD-10-CM | POA: Diagnosis not present

## 2022-11-07 DIAGNOSIS — E7800 Pure hypercholesterolemia, unspecified: Secondary | ICD-10-CM | POA: Diagnosis not present

## 2022-11-07 DIAGNOSIS — N184 Chronic kidney disease, stage 4 (severe): Secondary | ICD-10-CM | POA: Diagnosis not present

## 2022-11-07 DIAGNOSIS — I5032 Chronic diastolic (congestive) heart failure: Secondary | ICD-10-CM | POA: Diagnosis not present

## 2022-12-03 DIAGNOSIS — Z6833 Body mass index (BMI) 33.0-33.9, adult: Secondary | ICD-10-CM | POA: Diagnosis not present

## 2022-12-03 DIAGNOSIS — Z008 Encounter for other general examination: Secondary | ICD-10-CM | POA: Diagnosis not present

## 2022-12-03 DIAGNOSIS — E669 Obesity, unspecified: Secondary | ICD-10-CM | POA: Diagnosis not present

## 2022-12-09 DIAGNOSIS — N2581 Secondary hyperparathyroidism of renal origin: Secondary | ICD-10-CM | POA: Diagnosis not present

## 2022-12-09 DIAGNOSIS — I129 Hypertensive chronic kidney disease with stage 1 through stage 4 chronic kidney disease, or unspecified chronic kidney disease: Secondary | ICD-10-CM | POA: Diagnosis not present

## 2022-12-09 DIAGNOSIS — R809 Proteinuria, unspecified: Secondary | ICD-10-CM | POA: Diagnosis not present

## 2022-12-09 DIAGNOSIS — N184 Chronic kidney disease, stage 4 (severe): Secondary | ICD-10-CM | POA: Diagnosis not present

## 2022-12-09 DIAGNOSIS — D631 Anemia in chronic kidney disease: Secondary | ICD-10-CM | POA: Diagnosis not present

## 2022-12-16 DIAGNOSIS — Z23 Encounter for immunization: Secondary | ICD-10-CM | POA: Diagnosis not present

## 2022-12-16 DIAGNOSIS — R7989 Other specified abnormal findings of blood chemistry: Secondary | ICD-10-CM | POA: Diagnosis not present

## 2022-12-16 DIAGNOSIS — R946 Abnormal results of thyroid function studies: Secondary | ICD-10-CM | POA: Diagnosis not present

## 2023-04-21 DIAGNOSIS — R7302 Impaired glucose tolerance (oral): Secondary | ICD-10-CM | POA: Diagnosis not present

## 2023-04-21 DIAGNOSIS — N2581 Secondary hyperparathyroidism of renal origin: Secondary | ICD-10-CM | POA: Diagnosis not present

## 2023-04-21 DIAGNOSIS — Z0000 Encounter for general adult medical examination without abnormal findings: Secondary | ICD-10-CM | POA: Diagnosis not present

## 2023-04-21 DIAGNOSIS — K219 Gastro-esophageal reflux disease without esophagitis: Secondary | ICD-10-CM | POA: Diagnosis not present

## 2023-04-21 DIAGNOSIS — I10 Essential (primary) hypertension: Secondary | ICD-10-CM | POA: Diagnosis not present

## 2023-04-21 DIAGNOSIS — N184 Chronic kidney disease, stage 4 (severe): Secondary | ICD-10-CM | POA: Diagnosis not present

## 2023-04-21 DIAGNOSIS — E7800 Pure hypercholesterolemia, unspecified: Secondary | ICD-10-CM | POA: Diagnosis not present

## 2023-04-21 DIAGNOSIS — M1A3710 Chronic gout due to renal impairment, right ankle and foot, without tophus (tophi): Secondary | ICD-10-CM | POA: Diagnosis not present

## 2023-04-21 DIAGNOSIS — M150 Primary generalized (osteo)arthritis: Secondary | ICD-10-CM | POA: Diagnosis not present

## 2023-04-21 DIAGNOSIS — F0630 Mood disorder due to known physiological condition, unspecified: Secondary | ICD-10-CM | POA: Diagnosis not present

## 2023-04-21 DIAGNOSIS — I5032 Chronic diastolic (congestive) heart failure: Secondary | ICD-10-CM | POA: Diagnosis not present

## 2023-04-21 DIAGNOSIS — J449 Chronic obstructive pulmonary disease, unspecified: Secondary | ICD-10-CM | POA: Diagnosis not present

## 2023-04-27 DIAGNOSIS — I5032 Chronic diastolic (congestive) heart failure: Secondary | ICD-10-CM | POA: Diagnosis not present

## 2023-04-27 DIAGNOSIS — I10 Essential (primary) hypertension: Secondary | ICD-10-CM | POA: Diagnosis not present

## 2023-04-27 DIAGNOSIS — M150 Primary generalized (osteo)arthritis: Secondary | ICD-10-CM | POA: Diagnosis not present

## 2023-04-27 DIAGNOSIS — R7302 Impaired glucose tolerance (oral): Secondary | ICD-10-CM | POA: Diagnosis not present

## 2023-04-27 DIAGNOSIS — J449 Chronic obstructive pulmonary disease, unspecified: Secondary | ICD-10-CM | POA: Diagnosis not present

## 2023-04-27 DIAGNOSIS — N184 Chronic kidney disease, stage 4 (severe): Secondary | ICD-10-CM | POA: Diagnosis not present

## 2023-04-27 DIAGNOSIS — E7800 Pure hypercholesterolemia, unspecified: Secondary | ICD-10-CM | POA: Diagnosis not present

## 2023-06-09 DIAGNOSIS — N184 Chronic kidney disease, stage 4 (severe): Secondary | ICD-10-CM | POA: Diagnosis not present

## 2023-06-09 DIAGNOSIS — E875 Hyperkalemia: Secondary | ICD-10-CM | POA: Diagnosis not present

## 2023-06-09 DIAGNOSIS — D631 Anemia in chronic kidney disease: Secondary | ICD-10-CM | POA: Diagnosis not present

## 2023-06-09 DIAGNOSIS — R809 Proteinuria, unspecified: Secondary | ICD-10-CM | POA: Diagnosis not present

## 2023-06-09 DIAGNOSIS — I129 Hypertensive chronic kidney disease with stage 1 through stage 4 chronic kidney disease, or unspecified chronic kidney disease: Secondary | ICD-10-CM | POA: Diagnosis not present

## 2023-06-09 DIAGNOSIS — N2581 Secondary hyperparathyroidism of renal origin: Secondary | ICD-10-CM | POA: Diagnosis not present
